# Patient Record
Sex: Male | Born: 1945 | Race: White | Hispanic: No | Marital: Married | Smoking: Former smoker
Health system: Southern US, Community
[De-identification: ages and names within clinical notes are randomized; demographics above are authoritative.]

## PROBLEM LIST (undated history)

## (undated) DIAGNOSIS — K5730 Diverticulosis of large intestine without perforation or abscess without bleeding: Secondary | ICD-10-CM

## (undated) DIAGNOSIS — R030 Elevated blood-pressure reading, without diagnosis of hypertension: Secondary | ICD-10-CM

## (undated) DIAGNOSIS — K635 Polyp of colon: Secondary | ICD-10-CM

## (undated) DIAGNOSIS — Z860100 Personal history of colon polyps, unspecified: Secondary | ICD-10-CM

## (undated) DIAGNOSIS — M1712 Unilateral primary osteoarthritis, left knee: Secondary | ICD-10-CM

## (undated) DIAGNOSIS — E785 Hyperlipidemia, unspecified: Secondary | ICD-10-CM

## (undated) DIAGNOSIS — I8390 Asymptomatic varicose veins of unspecified lower extremity: Secondary | ICD-10-CM

## (undated) DIAGNOSIS — K649 Unspecified hemorrhoids: Secondary | ICD-10-CM

## (undated) DIAGNOSIS — R7303 Prediabetes: Secondary | ICD-10-CM

## (undated) DIAGNOSIS — N281 Cyst of kidney, acquired: Secondary | ICD-10-CM

## (undated) DIAGNOSIS — C4491 Basal cell carcinoma of skin, unspecified: Secondary | ICD-10-CM

## (undated) DIAGNOSIS — Z87442 Personal history of urinary calculi: Secondary | ICD-10-CM

## (undated) DIAGNOSIS — B029 Zoster without complications: Secondary | ICD-10-CM

## (undated) DIAGNOSIS — Z972 Presence of dental prosthetic device (complete) (partial): Secondary | ICD-10-CM

## (undated) DIAGNOSIS — K760 Fatty (change of) liver, not elsewhere classified: Secondary | ICD-10-CM

## (undated) HISTORY — DX: Hyperlipidemia, unspecified: E78.5

## (undated) HISTORY — DX: Zoster without complications: B02.9

## (undated) HISTORY — PX: HERNIA REPAIR: SHX51

---
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
## 2006-02-21 ENCOUNTER — Emergency Department: Payer: Self-pay | Admitting: Emergency Medicine

## 2008-02-18 ENCOUNTER — Emergency Department: Payer: Self-pay | Admitting: Emergency Medicine

## 2008-02-20 ENCOUNTER — Emergency Department: Payer: Self-pay | Admitting: Emergency Medicine

## 2008-03-12 ENCOUNTER — Ambulatory Visit: Payer: Self-pay | Admitting: General Surgery

## 2008-03-18 ENCOUNTER — Ambulatory Visit: Payer: Self-pay | Admitting: General Surgery

## 2010-12-11 ENCOUNTER — Ambulatory Visit: Payer: Self-pay | Admitting: Family Medicine

## 2013-04-29 ENCOUNTER — Ambulatory Visit: Payer: Self-pay | Admitting: Family Medicine

## 2014-10-07 DIAGNOSIS — Z23 Encounter for immunization: Secondary | ICD-10-CM | POA: Diagnosis not present

## 2014-10-07 DIAGNOSIS — Z0000 Encounter for general adult medical examination without abnormal findings: Secondary | ICD-10-CM | POA: Diagnosis not present

## 2014-10-08 DIAGNOSIS — Z0000 Encounter for general adult medical examination without abnormal findings: Secondary | ICD-10-CM | POA: Diagnosis not present

## 2014-10-08 DIAGNOSIS — Z131 Encounter for screening for diabetes mellitus: Secondary | ICD-10-CM | POA: Diagnosis not present

## 2014-10-08 DIAGNOSIS — E785 Hyperlipidemia, unspecified: Secondary | ICD-10-CM | POA: Diagnosis not present

## 2014-10-08 LAB — LIPID PANEL
CHOLESTEROL: 229 mg/dL — AB (ref 0–200)
HDL: 59 mg/dL (ref 35–70)
LDL Cholesterol: 154 mg/dL
Triglycerides: 81 mg/dL (ref 40–160)

## 2014-10-08 LAB — PSA: PSA: 0.5

## 2014-10-08 LAB — BASIC METABOLIC PANEL
BUN: 18 mg/dL (ref 4–21)
CREATININE: 1.2 mg/dL (ref 0.6–1.3)
GLUCOSE: 117 mg/dL
POTASSIUM: 4.8 mmol/L (ref 3.4–5.3)
Sodium: 144 mmol/L (ref 137–147)

## 2015-06-22 DIAGNOSIS — S8392XA Sprain of unspecified site of left knee, initial encounter: Secondary | ICD-10-CM | POA: Diagnosis not present

## 2015-07-07 ENCOUNTER — Ambulatory Visit (INDEPENDENT_AMBULATORY_CARE_PROVIDER_SITE_OTHER): Payer: Medicare Other | Admitting: Family Medicine

## 2015-07-07 ENCOUNTER — Ambulatory Visit
Admission: RE | Admit: 2015-07-07 | Discharge: 2015-07-07 | Disposition: A | Discharge status: Home / Self Care | Payer: Medicare Other | Source: Ambulatory Visit | Attending: Family Medicine | Admitting: Family Medicine

## 2015-07-07 ENCOUNTER — Encounter: Payer: Self-pay | Admitting: Family Medicine

## 2015-07-07 VITALS — BP 138/78 | HR 64 | Temp 97.5°F | Resp 14 | Wt 245.6 lb

## 2015-07-07 DIAGNOSIS — M25562 Pain in left knee: Secondary | ICD-10-CM | POA: Diagnosis not present

## 2015-07-07 DIAGNOSIS — N200 Calculus of kidney: Secondary | ICD-10-CM | POA: Insufficient documentation

## 2015-07-07 DIAGNOSIS — K429 Umbilical hernia without obstruction or gangrene: Secondary | ICD-10-CM | POA: Insufficient documentation

## 2015-07-07 DIAGNOSIS — B029 Zoster without complications: Secondary | ICD-10-CM

## 2015-07-07 DIAGNOSIS — M722 Plantar fascial fibromatosis: Secondary | ICD-10-CM

## 2015-07-07 DIAGNOSIS — D126 Benign neoplasm of colon, unspecified: Secondary | ICD-10-CM

## 2015-07-07 DIAGNOSIS — K635 Polyp of colon: Secondary | ICD-10-CM | POA: Insufficient documentation

## 2015-07-07 DIAGNOSIS — M7071 Other bursitis of hip, right hip: Secondary | ICD-10-CM

## 2015-07-07 DIAGNOSIS — E785 Hyperlipidemia, unspecified: Secondary | ICD-10-CM

## 2015-07-07 DIAGNOSIS — D125 Benign neoplasm of sigmoid colon: Secondary | ICD-10-CM

## 2015-07-07 DIAGNOSIS — R590 Localized enlarged lymph nodes: Secondary | ICD-10-CM | POA: Insufficient documentation

## 2015-07-07 DIAGNOSIS — H9193 Unspecified hearing loss, bilateral: Secondary | ICD-10-CM

## 2015-07-07 HISTORY — DX: Zoster without complications: B02.9

## 2015-07-07 HISTORY — DX: Hyperlipidemia, unspecified: E78.5

## 2015-07-07 NOTE — Progress Notes (Signed)
Subjective:     Patient ID: Jonathan Greer, male   DOB: 1946/04/01, 70 y.o.   MRN: [id number]  HPI  Chief Complaint  Patient presents with  . Knee Pain    Patient fell X 2 weeks ago, pain makes it difficult to sleep. Patient describes pain as a ache.  States he fell off the bottom rungs of a ladder and felt pain in the back of his left knee. Continues to hurt with W.B and NWB but improves after he has been up on it for a while. He has continued to work as a Contractor.   Review of Systems     Objective:   Physical Exam  Constitutional: He appears well-developed and well-nourished. Distressed: moderate discomfort when arisng from a chair.  Musculoskeletal:  Tender over the lateral aspect of his left knee. Knee ligaments stable. No effusion/erythema. KF/KE 5/5 with increased posterior knee pain with knee flexion. FROM.       Assessment:    1. Posterior knee pain, left: suspect tendonitis but will check for avulsion fracture. - DG Knee Complete 4 Views Left; Future    Plan:    Discussed scheduling two Aleve twice daily with food.

## 2015-07-07 NOTE — Patient Instructions (Signed)
Discussed scheduling two Aleve twice daily with food.

## 2015-07-08 ENCOUNTER — Telehealth: Payer: Self-pay

## 2015-07-08 NOTE — Telephone Encounter (Signed)
Patient advised as directed below. Patient verbalized understanding.  

## 2015-07-08 NOTE — Telephone Encounter (Signed)
-----   Message from Carmon Ginsberg, [place] sent at 07/07/2015  4:31 PM EST ----- No fractures. Try the aleve for a week. Ice your knee for 20 minutes at the end of your workday.

## 2015-07-26 DIAGNOSIS — S86912A Strain of unspecified muscle(s) and tendon(s) at lower leg level, left leg, initial encounter: Secondary | ICD-10-CM | POA: Diagnosis not present

## 2015-07-26 DIAGNOSIS — M25562 Pain in left knee: Secondary | ICD-10-CM | POA: Diagnosis not present

## 2015-08-25 ENCOUNTER — Encounter: Payer: Self-pay | Admitting: Family Medicine

## 2015-08-25 ENCOUNTER — Ambulatory Visit (INDEPENDENT_AMBULATORY_CARE_PROVIDER_SITE_OTHER): Payer: Medicare Other | Admitting: Family Medicine

## 2015-08-25 VITALS — BP 122/76 | HR 70 | Temp 97.8°F | Resp 16 | Wt 243.4 lb

## 2015-08-25 DIAGNOSIS — J3489 Other specified disorders of nose and nasal sinuses: Secondary | ICD-10-CM

## 2015-08-25 DIAGNOSIS — M222X2 Patellofemoral disorders, left knee: Secondary | ICD-10-CM

## 2015-08-25 MED ORDER — MELOXICAM 15 MG PO TABS: 15.0000 mg | ORAL_TABLET | Freq: Every day | ORAL | Status: DC

## 2015-08-25 MED ORDER — HYDROCODONE-ACETAMINOPHEN 5-325 MG PO TABS: ORAL_TABLET | ORAL | Status: DC

## 2015-08-25 NOTE — Patient Instructions (Signed)
Ice down your knee at the end of your day for 20 minutes.

## 2015-08-25 NOTE — Progress Notes (Signed)
Subjective:     Patient ID: Jonathan Greer, male   DOB: [birth year], 70 y.o.   MRN: [id number]  HPI  Chief Complaint  Patient presents with  . Skin Problem    Patient comes in office today for skin check, patient reports that he has a "bump" that has been present for the past 12 months located on the crease of his inner eye and nose onb his left side. Patient reports that area is very tender and painful to the touch, skin appears to be cracked and bleeding. Patient reports that area awas very swollen but has gone down in the past few days  . Knee Pain    Patient would like to address knee pain that he has been having for the past month, patient denies any injury or accident. Patient was seen at [facility], x-ray of knee according to patient was normal he was prescribed pain medication and Meloxicam 7.5mg . Patient reports that he finished pain medication and continues on Meloxicam but has not noticed any improvement  Originally saw me 1/19 for posterior knee pain with normal x-rays. Subsequently developed knee cap area pain and went to [facility] 2/7. X-rays once again were normal. Rx'ed meloxicam and has been using Osteo-Biflex otc. States he goes up and down ladders throughout his work day. Accompanied by his grandson today.    Review of Systems     Objective:   Physical Exam  Constitutional: He appears well-developed and well-nourished. No distress.  Musculoskeletal:  Mild tenderness over his left patella with "tightness" on ranging. Mild posterior knee tenderness with mild pain on resisting knee flexion.  Skin:  1.5 cm reddish brown papule on the bridge of his nose adjacent to his left medial canthus.       Assessment:    1. Patellofemoral syndrome, left - meloxicam (MOBIC) 15 MG tablet; Take 1 tablet (15 mg total) by mouth daily.  Dispense: 30 tablet; Refill: 0 - HYDROcodone-acetaminophen (NORCO/VICODIN) 5-325 MG tablet; One every 4-6 hours as needed for pain  Dispense:  28 tablet; Refill: 0 - Ambulatory referral to Orthopedic Surgery  2. Lesion of nose - Ambulatory referral to Dermatology    Plan:    return pending referral evaluations. Ice knee for 20 minutes after his work day.

## 2015-08-29 DIAGNOSIS — C44319 Basal cell carcinoma of skin of other parts of face: Secondary | ICD-10-CM | POA: Diagnosis not present

## 2015-08-29 DIAGNOSIS — D485 Neoplasm of uncertain behavior of skin: Secondary | ICD-10-CM | POA: Diagnosis not present

## 2015-08-29 DIAGNOSIS — L578 Other skin changes due to chronic exposure to nonionizing radiation: Secondary | ICD-10-CM | POA: Diagnosis not present

## 2015-09-15 DIAGNOSIS — M2242 Chondromalacia patellae, left knee: Secondary | ICD-10-CM | POA: Diagnosis not present

## 2015-09-15 DIAGNOSIS — M2240 Chondromalacia patellae, unspecified knee: Secondary | ICD-10-CM | POA: Insufficient documentation

## 2015-10-06 DIAGNOSIS — C44311 Basal cell carcinoma of skin of nose: Secondary | ICD-10-CM | POA: Diagnosis not present

## 2015-10-06 DIAGNOSIS — Z85828 Personal history of other malignant neoplasm of skin: Secondary | ICD-10-CM | POA: Diagnosis not present

## 2015-10-17 DIAGNOSIS — D485 Neoplasm of uncertain behavior of skin: Secondary | ICD-10-CM | POA: Diagnosis not present

## 2015-10-17 DIAGNOSIS — C44319 Basal cell carcinoma of skin of other parts of face: Secondary | ICD-10-CM | POA: Diagnosis not present

## 2016-01-05 DIAGNOSIS — C44319 Basal cell carcinoma of skin of other parts of face: Secondary | ICD-10-CM | POA: Diagnosis not present

## 2016-01-05 DIAGNOSIS — C44311 Basal cell carcinoma of skin of nose: Secondary | ICD-10-CM | POA: Diagnosis not present

## 2016-01-10 HISTORY — PX: MOHS SURGERY: SUR867

## 2016-01-17 ENCOUNTER — Encounter: Payer: Self-pay | Admitting: Family Medicine

## 2016-01-17 DIAGNOSIS — Z85828 Personal history of other malignant neoplasm of skin: Secondary | ICD-10-CM | POA: Insufficient documentation

## 2016-01-18 ENCOUNTER — Encounter: Payer: Self-pay | Admitting: Family Medicine

## 2016-01-28 DIAGNOSIS — H524 Presbyopia: Secondary | ICD-10-CM | POA: Diagnosis not present

## 2016-04-27 ENCOUNTER — Telehealth: Payer: Self-pay | Admitting: Family Medicine

## 2016-04-27 NOTE — Telephone Encounter (Signed)
Called Pt to schedule AWV with [facility] - knb °

## 2016-05-08 DIAGNOSIS — M25562 Pain in left knee: Secondary | ICD-10-CM | POA: Diagnosis not present

## 2016-05-08 DIAGNOSIS — G8929 Other chronic pain: Secondary | ICD-10-CM | POA: Diagnosis not present

## 2016-05-08 DIAGNOSIS — M5416 Radiculopathy, lumbar region: Secondary | ICD-10-CM | POA: Diagnosis not present

## 2016-05-21 DIAGNOSIS — C44319 Basal cell carcinoma of skin of other parts of face: Secondary | ICD-10-CM | POA: Diagnosis not present

## 2016-05-21 DIAGNOSIS — C44311 Basal cell carcinoma of skin of nose: Secondary | ICD-10-CM | POA: Diagnosis not present

## 2016-05-31 ENCOUNTER — Telehealth: Payer: Self-pay | Admitting: Family Medicine

## 2016-06-27 ENCOUNTER — Ambulatory Visit (INDEPENDENT_AMBULATORY_CARE_PROVIDER_SITE_OTHER): Payer: Medicare Other | Admitting: Family Medicine

## 2016-06-27 ENCOUNTER — Encounter: Payer: Self-pay | Admitting: Family Medicine

## 2016-06-27 VITALS — BP 132/80 | HR 58 | Temp 97.5°F | Resp 18 | Ht 69.75 in | Wt 245.0 lb

## 2016-06-27 DIAGNOSIS — D126 Benign neoplasm of colon, unspecified: Secondary | ICD-10-CM | POA: Diagnosis not present

## 2016-06-27 DIAGNOSIS — Z1159 Encounter for screening for other viral diseases: Secondary | ICD-10-CM | POA: Diagnosis not present

## 2016-06-27 DIAGNOSIS — Z0000 Encounter for general adult medical examination without abnormal findings: Secondary | ICD-10-CM | POA: Diagnosis not present

## 2016-06-27 DIAGNOSIS — Z85828 Personal history of other malignant neoplasm of skin: Secondary | ICD-10-CM

## 2016-06-27 DIAGNOSIS — Z23 Encounter for immunization: Secondary | ICD-10-CM | POA: Diagnosis not present

## 2016-06-27 DIAGNOSIS — E785 Hyperlipidemia, unspecified: Secondary | ICD-10-CM | POA: Diagnosis not present

## 2016-06-27 NOTE — Progress Notes (Signed)
Patient: Jonathan Greer, Male    DOB: 08-05-1945, 71 y.o.   MRN: [id number] Visit Date: 06/27/2016  Today's Provider: Lelon Huh, MD   Chief Complaint  Patient presents with  . Annual Exam  . Hyperlipidemia    follow up   Subjective:     Annual physical Jonathan Greer is a 71 y.o. male. He feels fairly well. He reports exercising rarely. He reports he is sleeping fairly well.  -----------------------------------------------------------  Lipid/Cholesterol, Follow-up:   Last seen for this 2 years ago.  Management changes since that visit include none; patient was advised to start regular exercise and low fat food choices. . Last Lipid Panel:    Component Value Date/Time   CHOL 229 (A) 10/08/2014   TRIG 81 10/08/2014   HDL 59 10/08/2014   [place] 154 10/08/2014    Risk factors for vascular disease include hypercholesterolemia  He reports fair compliance with treatment. He is not having side effects.  Current symptoms include none and have been stable. Weight trend: fluctuating a bit Prior visit with dietician: no Current diet: well balanced Current exercise: none  Wt Readings from Last 3 Encounters:  08/25/15 243 lb 6.4 oz (110.4 kg)  07/07/15 245 lb 9.6 oz (111.4 kg)    -------------------------------------------------------------------   Review of Systems  Constitutional: Negative for appetite change, chills, fatigue and fever.  HENT: Negative for congestion, ear pain, hearing loss, nosebleeds and trouble swallowing.   Eyes: Negative for pain and visual disturbance.  Respiratory: Negative for cough, chest tightness and shortness of breath.   Cardiovascular: Negative for chest pain, palpitations and leg swelling.  Gastrointestinal: Negative for abdominal pain, blood in stool, constipation, diarrhea, nausea and vomiting.  Endocrine: Negative for polydipsia, polyphagia and polyuria.  Genitourinary: Negative for dysuria and flank pain.    Musculoskeletal: Negative for arthralgias, back pain, joint swelling, myalgias and neck stiffness.  Skin: Negative for color change, rash and wound.  Neurological: Negative for dizziness, tremors, seizures, speech difficulty, weakness, light-headedness and headaches.  Psychiatric/Behavioral: Negative for behavioral problems, confusion, decreased concentration, dysphoric mood and sleep disturbance. The patient is not nervous/anxious.   All other systems reviewed and are negative.   Social History   Social History  . Marital status: Married    Spouse name: N/A  . Number of children: 4  . Years of education: N/A   Occupational History  . 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 Smoking status: Former Smoker    Packs/day: 2.00    Years: 20.00    Types: Cigarettes  . Smokeless tobacco: Never Used  . Alcohol use No  . Drug use: No  . Sexual activity: Not on file   Other Topics Concern  . Not on file   Social History Narrative  . No narrative on file    Past Medical History:  Diagnosis Date  . Shingles 07/07/2015     Patient Active Problem List   Diagnosis Date Noted  . History of basal cell carcinoma of skin 01/17/2016  . Hyperlipemia 07/07/2015  . 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 Kidney stone 07/07/2015  . 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 HERNIA REPAIR    . MOHS SURGERY  01/10/2016   [facility] for [facility]  . TONSILLECTOMY AND ADENOIDECTOMY  1960's    His  family history includes CVA in his father; Cancer in his paternal grandfather and paternal grandmother; Heart failure in his mother; Liver cancer in his maternal grandmother; Lung cancer in his brother.     No current outpatient prescriptions on file.  Patient Care Team: Birdie Sons, MD as PCP - General (Family Medicine)     Objective:   Vitals: BP 132/80 (BP Location:  Left Arm, Patient Position: Sitting, Cuff Size: Large)   Pulse (!) 58   Temp 97.5 F (36.4 C) (Oral)   Resp 18   Ht 5' 9.75" (1.772 m)   Wt 245 lb (111.1 kg)   SpO2 98% Comment: room air  BMI 35.41 kg/m   Physical Exam   General Appearance:    Alert, cooperative, no distress, appears stated age  Head:    Normocephalic, without obvious abnormality, atraumatic  Eyes:    PERRL, conjunctiva/corneas clear, EOM's intact, fundi    benign, both eyes       Ears:    Normal TM's and external ear canals, both ears  Nose:   Nares normal, septum midline, mucosa normal, no drainage   or sinus tenderness  Throat:   Lips, mucosa, and tongue normal; teeth and gums normal  Neck:   Supple, symmetrical, trachea midline, no adenopathy;       thyroid:  No enlargement/tenderness/nodules; no carotid   bruit or JVD  Back:     Symmetric, no curvature, ROM normal, no CVA tenderness  Lungs:     Clear to auscultation bilaterally, respirations unlabored  Chest wall:    No tenderness or deformity  Heart:    Regular rate and rhythm, S1 and S2 normal, no murmur, rub   or gallop  Abdomen:     Soft, non-tender, bowel sounds active all four quadrants,    no masses, no organomegaly  Genitalia:    deferred  Rectal:    deferred  Extremities:   Extremities normal, atraumatic, no cyanosis or edema  Pulses:   2+ and symmetric all extremities  Skin:   Skin color, texture, turgor normal, no rashes or lesions  Lymph nodes:   Cervical, supraclavicular, and axillary nodes normal  Neurologic:   CNII-XII intact. Normal strength, sensation and reflexes      throughout    Activities of Daily Living In your present state of health, do you have any difficulty performing the following activities: 06/27/2016  Hearing? Y  Vision? Y  Difficulty concentrating or making decisions? Y  Walking or climbing stairs? N  Dressing or bathing? N  Doing errands, shopping? N  Some recent data might be hidden    Fall Risk  Assessment Fall Risk  06/27/2016  Falls in the past year? No     Depression Screen PHQ 2/9 Scores 06/27/2016  PHQ - 2 Score 0    Cognitive Testing - 6-CIT  Correct? Score   What year is it? yes 0 0 or 4  What month is it? yes 0 0 or 3  Memorize:    [place],  42,  [place],      What time is it? (within 1 hour) yes 0 0 or 3  Count backwards from 20 yes 0 0, 2, or 4  Name the months of the year yes 0 0, 2, or 4  Repeat name & address above no 6 0, 2, 4, 6, 8, or 10       TOTAL SCORE  6/28   Interpretation:  Normal  Normal (  0-7) Abnormal (8-28)   Audit-C Alcohol Use Screening  Question Answer Points  How often do you have alcoholic drink? never 0  On days you do drink alcohol, how many drinks do you typically consume? n/a 0  How oftey will you drink 6 or more in a total? never 0  Total Score:  0   A score of 3 or more in women, and 4 or more in men indicates increased risk for alcohol abuse, EXCEPT if all of the points are from question 1.  Current Exercise Habits: The patient does not participate in regular exercise at present Exercise limited by: None identified    Assessment & Plan:     Annual Wellness Visit  Reviewed patient's Family Medical History Reviewed and updated list of patient's medical providers Assessment of cognitive impairment was done Assessed patient's functional ability Established a written schedule for health screening [place] Completed and Reviewed  Exercise Activities and Dietary recommendations Goals    None      Immunization History  Administered Date(s) Administered  . Pneumococcal Conjugate-13 10/07/2014  . Pneumococcal Polysaccharide-23 03/18/2013  . Tdap 06/30/2010    Health Maintenance  Topic Date Due  . Hepatitis C Screening  12-Oct-1945  . COLONOSCOPY  02/10/1996  . ZOSTAVAX  02/09/2006  . PNA vac Low Risk Adult (2 of 2 - PCV13) 03/18/2014  . INFLUENZA VACCINE  01/17/2016  .  TETANUS/TDAP  06/30/2020     Discussed health benefits of physical activity, and encouraged him to engage in regular exercise appropriate for his age and condition.    ------------------------------------------------------------------------------------------------------------  1. Annual physical exam Generally doing well.   2. History of basal cell carcinoma of skin Followed at [facility] dermatology.   3. Hyperlipidemia, unspecified hyperlipidemia type Not currently on statin.  - Lipid panel - Comprehensive metabolic panel - TSH  4. Need for hepatitis C screening test  - Hepatitis C antibody  5. Need for influenza vaccination  - Flu vaccine HIGH DOSE PF  6. Tubulovillous adenoma of colon Is overdue for follow up colonoscopy. Last was by Dr. Dionne Milo January 2013 - Ambulatory referral to Gastroenterology  The entirety of the information documented in the History of Present Illness, Review of Systems and Physical Exam were personally obtained by me. Portions of this information were initially documented by Meyer Cory, CMA and reviewed by me for thoroughness and accuracy.    Lelon Huh, MD  [facility]

## 2016-06-28 DIAGNOSIS — Z1159 Encounter for screening for other viral diseases: Secondary | ICD-10-CM | POA: Diagnosis not present

## 2016-06-28 DIAGNOSIS — E785 Hyperlipidemia, unspecified: Secondary | ICD-10-CM | POA: Diagnosis not present

## 2016-06-29 ENCOUNTER — Telehealth: Payer: Self-pay

## 2016-06-29 LAB — COMPREHENSIVE METABOLIC PANEL
A/G RATIO: 1.5 (ref 1.2–2.2)
ALT: 38 IU/L (ref 0–44)
AST: 32 IU/L (ref 0–40)
Albumin: 4.5 g/dL (ref 3.5–4.8)
Alkaline Phosphatase: 51 IU/L (ref 39–117)
BUN/Creatinine Ratio: 14 (ref 10–24)
BUN: 15 mg/dL (ref 8–27)
Bilirubin Total: 0.8 mg/dL (ref 0.0–1.2)
CALCIUM: 9.9 mg/dL (ref 8.6–10.2)
CO2: 22 mmol/L (ref 18–29)
CREATININE: 1.06 mg/dL (ref 0.76–1.27)
Chloride: 102 mmol/L (ref 96–106)
GFR, EST AFRICAN AMERICAN: 82 mL/min/{1.73_m2} (ref >59)
GFR, EST NON AFRICAN AMERICAN: 71 mL/min/{1.73_m2} (ref >59)
Globulin, Total: 3 g/dL (ref 1.5–4.5)
Glucose: 104 mg/dL — ABNORMAL HIGH (ref 65–99)
POTASSIUM: 4.5 mmol/L (ref 3.5–5.2)
Sodium: 142 mmol/L (ref 134–144)
Total Protein: 7.5 g/dL (ref 6.0–8.5)

## 2016-06-29 LAB — HEPATITIS C ANTIBODY: Hep C Virus Ab: <0.1 s/co ratio (ref 0.0–0.9)

## 2016-06-29 LAB — LIPID PANEL
CHOL/HDL RATIO: 3.3 ratio (ref 0.0–5.0)
Cholesterol, Total: 224 mg/dL — ABNORMAL HIGH (ref 100–199)
HDL: 68 mg/dL (ref >39)
LDL CALC: 143 mg/dL — AB (ref 0–99)
TRIGLYCERIDES: 66 mg/dL (ref 0–149)
VLDL Cholesterol Cal: 13 mg/dL (ref 5–40)

## 2016-06-29 LAB — TSH: TSH: 1.97 u[IU]/mL (ref 0.450–4.500)

## 2016-06-29 NOTE — Telephone Encounter (Signed)
-----   Message from Birdie Sons, MD sent at 06/29/2016 12:15 PM EST ----- Labs are good, cholesterol a little high at 224. Not high enough to require medications, but do recommend taking 81mg  coated aspirin every day. Check labs yearly .

## 2016-06-29 NOTE — Telephone Encounter (Signed)
Pt advised.   Thanks,   -Majesti Gambrell  

## 2016-07-11 ENCOUNTER — Telehealth: Payer: Self-pay

## 2016-07-11 ENCOUNTER — Other Ambulatory Visit: Payer: Self-pay

## 2016-07-11 NOTE — Telephone Encounter (Signed)
Gastroenterology Pre-Procedure Review  Request Date:  Requesting Physician: Dr.   PATIENT REVIEW QUESTIONS: The patient responded to the following health history questions as indicated:    1. Are you having any GI issues? yes (constipation) 2. Do you have a personal history of Polyps? yes (polyps) 3. Do you have a family history of Colon Cancer or Polyps? no 4. Diabetes Mellitus? no 5. Joint replacements in the past 12 months?no 6. Major health problems in the past 3 months?no 7. Any artificial heart valves, MVP, or defibrillator?no    MEDICATIONS & ALLERGIES:    Patient reports the following regarding taking any anticoagulation/antiplatelet therapy:   Plavix, Coumadin, Eliquis, Xarelto, Lovenox, Pradaxa, Brilinta, or Effient? no Aspirin? no  Patient confirms/reports the following medications:  No current outpatient prescriptions on file.   No current facility-administered medications for this visit.     Patient confirms/reports the following allergies:  No Known Allergies  No orders of the defined types were placed in this encounter.   AUTHORIZATION INFORMATION Primary Insurance: 1D#: Group #:  Secondary Insurance: 1D#: Group #:  SCHEDULE INFORMATION: Date: 07/27/16 Time: Location: [facility]

## 2016-07-27 ENCOUNTER — Ambulatory Visit: Payer: Medicare Other | Admitting: Anesthesiology

## 2016-07-27 ENCOUNTER — Encounter
Admission: RE | Disposition: A | Discharge status: Home / Self Care | Payer: Self-pay | Source: Ambulatory Visit | Attending: Gastroenterology

## 2016-07-27 ENCOUNTER — Ambulatory Visit
Admission: RE | Admit: 2016-07-27 | Discharge: 2016-07-27 | Disposition: A | Discharge status: Home / Self Care | Payer: Medicare Other | Source: Ambulatory Visit | Attending: Gastroenterology | Admitting: Gastroenterology

## 2016-07-27 DIAGNOSIS — K635 Polyp of colon: Secondary | ICD-10-CM | POA: Diagnosis not present

## 2016-07-27 DIAGNOSIS — K640 First degree hemorrhoids: Secondary | ICD-10-CM | POA: Diagnosis not present

## 2016-07-27 DIAGNOSIS — Z1211 Encounter for screening for malignant neoplasm of colon: Secondary | ICD-10-CM | POA: Diagnosis not present

## 2016-07-27 DIAGNOSIS — D123 Benign neoplasm of transverse colon: Secondary | ICD-10-CM | POA: Diagnosis not present

## 2016-07-27 DIAGNOSIS — K5790 Diverticulosis of intestine, part unspecified, without perforation or abscess without bleeding: Secondary | ICD-10-CM | POA: Diagnosis not present

## 2016-07-27 DIAGNOSIS — D125 Benign neoplasm of sigmoid colon: Secondary | ICD-10-CM

## 2016-07-27 DIAGNOSIS — Z87442 Personal history of urinary calculi: Secondary | ICD-10-CM | POA: Insufficient documentation

## 2016-07-27 DIAGNOSIS — Z860100 Personal history of colon polyps, unspecified: Secondary | ICD-10-CM

## 2016-07-27 DIAGNOSIS — Z87891 Personal history of nicotine dependence: Secondary | ICD-10-CM | POA: Insufficient documentation

## 2016-07-27 DIAGNOSIS — D122 Benign neoplasm of ascending colon: Secondary | ICD-10-CM | POA: Diagnosis not present

## 2016-07-27 DIAGNOSIS — K5730 Diverticulosis of large intestine without perforation or abscess without bleeding: Secondary | ICD-10-CM | POA: Diagnosis not present

## 2016-07-27 DIAGNOSIS — K649 Unspecified hemorrhoids: Secondary | ICD-10-CM | POA: Diagnosis not present

## 2016-07-27 DIAGNOSIS — D120 Benign neoplasm of cecum: Secondary | ICD-10-CM | POA: Diagnosis not present

## 2016-07-27 HISTORY — PX: COLONOSCOPY WITH PROPOFOL: SHX5780

## 2016-07-27 SURGERY — COLONOSCOPY WITH PROPOFOL
Anesthesia: General

## 2016-07-27 MED ORDER — FENTANYL CITRATE (PF) 100 MCG/2ML IJ SOLN
INTRAMUSCULAR | Status: DC | PRN
  Administered 2016-07-27: 50 ug via INTRAVENOUS

## 2016-07-27 MED ORDER — FENTANYL CITRATE (PF) 100 MCG/2ML IJ SOLN
INTRAMUSCULAR | Status: AC
  Filled 2016-07-27: qty 2

## 2016-07-27 MED ORDER — SODIUM CHLORIDE 0.9 % IV SOLN
INTRAVENOUS | Status: DC
  Administered 2016-07-27: 09:00:00 via INTRAVENOUS

## 2016-07-27 MED ORDER — MIDAZOLAM HCL 5 MG/5ML IJ SOLN
INTRAMUSCULAR | Status: DC | PRN
  Administered 2016-07-27: 1 mg via INTRAVENOUS

## 2016-07-27 MED ORDER — PROPOFOL 500 MG/50ML IV EMUL
INTRAVENOUS | Status: AC
  Filled 2016-07-27: qty 50

## 2016-07-27 MED ORDER — LACTATED RINGERS IV SOLN
INTRAVENOUS | Status: DC | PRN
  Administered 2016-07-27: 09:00:00 via INTRAVENOUS

## 2016-07-27 MED ORDER — PROPOFOL 10 MG/ML IV BOLUS
INTRAVENOUS | Status: DC | PRN
  Administered 2016-07-27: 54.5 mg via INTRAVENOUS

## 2016-07-27 MED ORDER — PROPOFOL 500 MG/50ML IV EMUL
INTRAVENOUS | Status: DC | PRN
  Administered 2016-07-27: 120 ug/kg/min via INTRAVENOUS

## 2016-07-27 MED ORDER — LIDOCAINE HCL (PF) 2 % IJ SOLN
INTRAMUSCULAR | Status: AC
  Filled 2016-07-27: qty 2

## 2016-07-27 MED ORDER — METHYLENE BLUE 0.5 % INJ SOLN
INTRAVENOUS | Status: DC | PRN
  Administered 2016-07-27: 1 mL via SUBMUCOSAL
  Administered 2016-07-27: 11 mL via SUBMUCOSAL

## 2016-07-27 MED ORDER — SPOT INK MARKER SYRINGE KIT
PACK | SUBMUCOSAL | Status: DC | PRN
  Administered 2016-07-27: 1 mL via SUBMUCOSAL

## 2016-07-27 MED ORDER — METHYLENE BLUE 0.5 % INJ SOLN
INTRAVENOUS | Status: AC
  Filled 2016-07-27: qty 10

## 2016-07-27 MED ORDER — MIDAZOLAM HCL 2 MG/2ML IJ SOLN
INTRAMUSCULAR | Status: AC
  Filled 2016-07-27: qty 2

## 2016-07-27 NOTE — Anesthesia Postprocedure Evaluation (Signed)
Anesthesia Post Note  Patient: Jonathan Greer  Procedure(s) Performed: Procedure(s) (LRB): COLONOSCOPY WITH PROPOFOL (N/A)  Patient location during evaluation: Endoscopy Anesthesia Type: General Level of consciousness: awake and alert Pain management: pain level controlled Vital Signs Assessment: post-procedure vital signs reviewed and stable Respiratory status: spontaneous breathing and respiratory function stable Cardiovascular status: stable Anesthetic complications: no     Last Vitals:  Vitals:   07/27/16 0838 07/27/16 1008  BP: 135/88   Pulse: 67 63  Resp: 16 15  Temp: 36.3 C (!) 35.6 C    Last Pain:  Vitals:   07/27/16 1008  TempSrc: Tympanic                 Marlyne Totaro K

## 2016-07-27 NOTE — Transfer of Care (Signed)
Immediate Anesthesia Transfer of Care Note  Patient: Jonathan Greer  Procedure(s) Performed: Procedure(s): COLONOSCOPY WITH PROPOFOL (N/A)  Patient Location: PACU and Endoscopy Unit  Anesthesia Type:General  Level of Consciousness: sedated  Airway & Oxygen Therapy: Patient Spontanous Breathing and Patient connected to nasal cannula oxygen  Post-op Assessment: Report given to RN and Post -op Vital signs reviewed and stable  Post vital signs: stable  Last Vitals:  Vitals:   07/27/16 0838 07/27/16 1008  BP: 135/88   Pulse: 67   Resp: 16   Temp: 36.3 C (!) (P) 35.6 C    Last Pain:  Vitals:   07/27/16 1008  TempSrc: (P) Tympanic         Complications: No apparent anesthesia complications

## 2016-07-27 NOTE — H&P (Signed)
  Jonathan Bellows MD [address], [place], [place] [address] Phone: [phone] Fax : [phone]  Primary Care Physician:  Lelon Huh, MD Primary Gastroenterologist:  Dr. Jonathon Greer   Pre-Procedure History & Physical: HPI:  Jonathan Greer is a 71 y.o. male is here for an colonoscopy.   Past Medical History:  Diagnosis Date  . Hyperlipidemia   . Shingles 07/07/2015    Past Surgical History:  Procedure Laterality Date  . HERNIA REPAIR    . MOHS SURGERY  01/10/2016   [facility]  . TONSILLECTOMY AND ADENOIDECTOMY  1960's    Prior to Admission medications   Not on File    Allergies as of 07/11/2016  . (No Known Allergies)    Family History  Problem Relation Age of Onset  . Heart failure Mother   . CVA Father   . Lung cancer Brother   . Liver cancer Maternal Grandmother   . Cancer Paternal Grandmother   . Cancer Paternal Grandfather     Social History   Social History  . Marital status: Married    Spouse name: N/A  . Number of children: 4  . Years of education: N/A   Occupational History  . 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 Smoking status: Former Smoker    Packs/day: 2.00    Years: 20.00    Types: Cigarettes    Quit date: 06/18/1974  . Smokeless tobacco: Never Used     Comment: Quit in the 79s  . Alcohol use No  . Drug use: No  . Sexual activity: Not on file   Other Topics Concern  . Not on file   Social History Narrative   Working 5 days a week in [employer]. (06/2016)    Review of Systems: See HPI, otherwise negative ROS  Physical Exam: BP 135/88   Pulse 67   Temp 97.4 F (36.3 C) (Tympanic)   Resp 16   Ht 5' 9.5" (1.765 m)   Wt 240 lb (108.9 kg)   SpO2 100%   BMI 34.93 kg/m  General:   Alert,  pleasant and cooperative in NAD Head:  Normocephalic and atraumatic. Neck:  Supple; no masses or thyromegaly. Lungs:  Clear throughout to auscultation.      Heart:  Regular rate and rhythm. Abdomen:  Soft, nontender and nondistended. Normal bowel sounds, without guarding, and without rebound.   Neurologic:  Alert and  oriented x4;  grossly normal neurologically.  Impression/Plan: Jonathan Greer is here for an colonoscopy to be performed for surveillance of colon polyps due to prior history   Risks, benefits, limitations, and alternatives regarding  colonoscopy have been reviewed with the patient.  Questions have been answered.  All parties agreeable.   Jonathan Bellows, MD  07/27/2016, 9:05 AM

## 2016-07-27 NOTE — Anesthesia Postprocedure Evaluation (Signed)
Anesthesia Post Note  Patient: Jonathan Greer  Procedure(s) Performed: Procedure(s) (LRB): COLONOSCOPY WITH PROPOFOL (N/A)  Patient location during evaluation: Endoscopy Anesthesia Type: General Level of consciousness: awake and alert Pain management: pain level controlled Vital Signs Assessment: post-procedure vital signs reviewed and stable Respiratory status: spontaneous breathing and respiratory function stable Cardiovascular status: stable Anesthetic complications: no     Last Vitals:  Vitals:   07/27/16 0838 07/27/16 1008  BP: 135/88   Pulse: 67 63  Resp: 16 15  Temp: 36.3 C (!) 35.6 C    Last Pain:  Vitals:   07/27/16 1008  TempSrc: Tympanic                 KEPHART,WILLIAM K

## 2016-07-27 NOTE — Anesthesia Preprocedure Evaluation (Signed)
Anesthesia Evaluation  Patient identified by MRN, date of birth, ID band Patient awake    Reviewed: Allergy & Precautions, NPO status , Patient's Chart, lab work & pertinent test results  History of Anesthesia Complications Negative for: history of anesthetic complications  Airway Mallampati: II       Dental  (+) Edentulous Upper, Edentulous Lower   Pulmonary neg pulmonary ROS, former smoker,           Cardiovascular negative cardio ROS       Neuro/Psych negative neurological ROS     GI/Hepatic negative GI ROS, Neg liver ROS,   Endo/Other  negative endocrine ROS  Renal/GU Renal disease (stones)     Musculoskeletal   Abdominal   Peds  Hematology negative hematology ROS (+)   Anesthesia Other Findings   Reproductive/Obstetrics                            Anesthesia Physical Anesthesia Plan  ASA: I  Anesthesia Plan: General   Post-op Pain Management:    Induction: Intravenous  Airway Management Planned: Nasal Cannula  Additional Equipment:   Intra-op Plan:   Post-operative Plan:   Informed Consent: I have reviewed the patients History and Physical, chart, labs and discussed the procedure including the risks, benefits and alternatives for the proposed anesthesia with the patient or authorized representative who has indicated his/her understanding and acceptance.     Plan Discussed with:   Anesthesia Plan Comments:         Anesthesia Quick Evaluation

## 2016-07-27 NOTE — Anesthesia Post-op Follow-up Note (Signed)
Anesthesia QCDR form completed.        

## 2016-07-27 NOTE — Op Note (Signed)
[facility] Patient Name: Jonathan Greer Procedure Date: 07/27/2016 9:08 AM MRN: [id number] Account #: [id number] Date of Birth: 05/28/1946 Admit Type: Outpatient Age: 71 Room: [facility] ENDO ROOM 1 Gender: Male Note Status: Finalized Procedure:            Colonoscopy Indications:          High risk colon cancer surveillance: Personal history                        of colonic polyps, Last colonoscopy: April 2003 Providers:            [name] MD, MD Referring MD:         Kirstie Peri. Caryn Section, MD (Referring MD) Medicines:            Monitored Anesthesia Care Complications:        No immediate complications. Procedure:            Pre-Anesthesia Assessment:                       - Prior to the procedure, a History and Physical was                        performed, and patient medications, allergies and                        sensitivities were reviewed. The patient's tolerance of                        previous anesthesia was reviewed.                       - The risks and benefits of the procedure and the                        sedation options and risks were discussed with the                        patient. All questions were answered and informed                        consent was obtained.                       - The risks and benefits of the procedure and the                        sedation options and risks were discussed with the                        patient. All questions were answered and informed                        consent was obtained.                       - ASA Grade Assessment: I - A normal, healthy patient.                       After obtaining informed consent, the colonoscope was  passed under direct vision. Throughout the procedure,                        the patient's blood pressure, pulse, and oxygen                        saturations were monitored continuously. The                        Colonoscope was  introduced through the anus and                        advanced to the the cecum, identified by the                        appendiceal orifice, IC valve and transillumination.                        The colonoscopy was somewhat difficult. The patient                        tolerated the procedure well. The quality of the bowel                        preparation was good. Findings:      The perianal and digital rectal examinations were normal.      A 5 mm polyp was found in the ascending colon. The polyp was sessile.       The polyp was removed with a cold biopsy forceps. Resection and       retrieval were complete.      A 7 mm polyp was found in the transverse colon. The polyp was sessile.       The polyp was removed with a hot snare. Resection and retrieval were       complete.      A 6 mm polyp was found in the transverse colon. The polyp was sessile.       The polyp was removed with a cold snare. Resection and retrieval were       complete.      A 15 mm polyp was found in the transverse colon. The polyp was flat. The       polyp was removed with a saline injection-lift technique using a hot       snare and The polyp was removed with a piecemeal technique using a hot       snare. Resection and retrieval were complete. One hemostatic clip was       successfully placed. The area was tatooed proximally and distally with 1       cc of spot dye      A 10 mm polyp was found in the sigmoid colon. The polyp was       semi-pedunculated. The polyp was removed with a hot snare. Resection and       retrieval were complete. To prevent bleeding post-intervention, one       hemostatic clip was successfully placed. There was no bleeding during,       or at the end, of the procedure.      A few medium-mouthed diverticula were found in the sigmoid colon.      Non-bleeding internal hemorrhoids were found during retroflexion. The  hemorrhoids were small and Grade I (internal hemorrhoids that do not        prolapse).      The exam was otherwise without abnormality on direct and retroflexion       views. Impression:           - One 5 mm polyp in the ascending colon, removed with a                        cold biopsy forceps. Resected and retrieved.                       - One 7 mm polyp in the transverse colon, removed with                        a hot snare. Resected and retrieved.                       - One 6 mm polyp in the transverse colon, removed with                        a cold snare. Resected and retrieved.                       - One 15 mm polyp in the transverse colon, removed                        using injection-lift and a hot snare and removed                        piecemeal using a hot snare. Resected and retrieved.                        Clip was placed.                       - One 10 mm polyp in the sigmoid colon, removed with a                        hot snare. Resected and retrieved. Clip was placed.                       - Diverticulosis in the sigmoid colon.                       - Non-bleeding internal hemorrhoids.                       - The examination was otherwise normal on direct and                        retroflexion views. Recommendation:       - Discharge patient to home (with escort).                       - Resume previous diet.                       - No aspirin, ibuprofen, naproxen, or other  non-steroidal anti-inflammatory drugs for 2 weeks after                        polyp removal.                       - Await pathology results.                       - Repeat colonoscopy in 6 months for surveillance after                        piecemeal polypectomy. Procedure Code(s):    --- Professional ---                       [phone], Colonoscopy, flexible; with removal of tumor(s),                        polyp(s), or other lesion(s) by snare technique                       45380, 19, Colonoscopy, flexible; with biopsy, single                         or multiple Diagnosis Code(s):    --- Professional ---                       Z86.010, Personal history of colonic polyps                       D12.2, Benign neoplasm of ascending colon                       D12.3, Benign neoplasm of transverse colon (hepatic                        flexure or splenic flexure)                       D12.5, Benign neoplasm of sigmoid colon                       K64.0, First degree hemorrhoids                       K57.30, Diverticulosis of large intestine without                        perforation or abscess without bleeding CPT copyright 2016 American Medical Association. All rights reserved. The codes documented in this report are preliminary and upon coder review may  be revised to meet current compliance requirements. [name], MD [name] MD, MD 07/27/2016 10:09:05 AM This report has been signed electronically. Number of Addenda: 0 Note Initiated On: 07/27/2016 9:08 AM Scope Withdrawal Time: 0 hours 27 minutes 11 seconds  Total Procedure Duration: 0 hours 29 minutes 51 seconds       [facility]

## 2016-07-30 ENCOUNTER — Encounter: Payer: Self-pay | Admitting: Gastroenterology

## 2016-07-30 LAB — SURGICAL PATHOLOGY

## 2016-08-02 ENCOUNTER — Telehealth: Payer: Self-pay

## 2016-08-02 NOTE — Telephone Encounter (Signed)
-----   Message from [name], MD sent at 07/30/2016 10:51 AM EST ----- Multiple adenomas- recall colonoscopy in 6 months as taken out piece meal

## 2016-08-02 NOTE — Telephone Encounter (Signed)
Pt notified of colonoscopy results. Pt has been added to recall to contact in July.

## 2016-08-17 ENCOUNTER — Telehealth: Payer: Self-pay | Admitting: Family Medicine

## 2016-08-17 NOTE — Telephone Encounter (Signed)
Called Pt to schedule AWV with [facility] - knb °

## 2016-12-26 ENCOUNTER — Ambulatory Visit (INDEPENDENT_AMBULATORY_CARE_PROVIDER_SITE_OTHER): Payer: Medicare Other | Admitting: Family Medicine

## 2016-12-26 VITALS — BP 122/70 | HR 80 | Temp 98.1°F | Resp 16

## 2016-12-26 DIAGNOSIS — L989 Disorder of the skin and subcutaneous tissue, unspecified: Secondary | ICD-10-CM | POA: Diagnosis not present

## 2016-12-26 DIAGNOSIS — H2513 Age-related nuclear cataract, bilateral: Secondary | ICD-10-CM | POA: Diagnosis not present

## 2016-12-26 DIAGNOSIS — Z85828 Personal history of other malignant neoplasm of skin: Secondary | ICD-10-CM

## 2016-12-27 NOTE — Progress Notes (Signed)
Name: Jonathan Greer   MRN: [id number]    DOB: 01/02/1946   Date:12/27/2016       Progress Note  Subjective  Chief Complaint  Chief Complaint  Patient presents with  . Skin Problem    HPI  Patient with history of basal cell carcinoma of left temple presents today for new skin lesion on the side of his nose present for the last couple of months. Is not painful and not draining.   He was initially evaluated by Dr. Aubery Lapping in March of 2017 and subsequently had Mohs surgery at [facility] in July of 2017.    Past Medical History:  Diagnosis Date  . Hyperlipidemia   . Shingles 07/07/2015    Social History  Substance Use Topics  . Smoking status: Former Smoker    Packs/day: 2.00    Years: 20.00    Types: Cigarettes    Quit date: 06/18/1974  . Smokeless tobacco: Never Used     Comment: Quit in the 32s  . Alcohol use No    No current outpatient prescriptions on file.  No Known Allergies  Review of Systems  Constitutional: Negative for chills, fever and weight loss.  Skin: Negative for itching and rash.      Objective   Vitals:   12/26/16 1618  BP: 122/70  Pulse: 80  Resp: 16  Temp: 98.1 F (36.7 C)     Physical Exam  Small, approx 34mm flesh raised flesh colored lesion and crease of left nasal bridge. No erythema.     Assessment & Plan  1. Skin lesion of face Considering history he is at hight risk for skin cancer.  - Ambulatory referral to Dermatology  2. History of basal cell carcinoma of skin

## 2017-01-01 DIAGNOSIS — D485 Neoplasm of uncertain behavior of skin: Secondary | ICD-10-CM | POA: Diagnosis not present

## 2017-01-01 DIAGNOSIS — Z85828 Personal history of other malignant neoplasm of skin: Secondary | ICD-10-CM | POA: Diagnosis not present

## 2017-01-01 DIAGNOSIS — L821 Other seborrheic keratosis: Secondary | ICD-10-CM | POA: Diagnosis not present

## 2017-01-01 DIAGNOSIS — C44311 Basal cell carcinoma of skin of nose: Secondary | ICD-10-CM | POA: Diagnosis not present

## 2017-01-01 DIAGNOSIS — C44319 Basal cell carcinoma of skin of other parts of face: Secondary | ICD-10-CM | POA: Diagnosis not present

## 2017-01-10 ENCOUNTER — Other Ambulatory Visit: Payer: Self-pay

## 2017-01-10 DIAGNOSIS — Z86010 Personal history of colonic polyps: Secondary | ICD-10-CM

## 2017-01-15 ENCOUNTER — Encounter: Payer: Self-pay | Admitting: Emergency Medicine

## 2017-01-15 ENCOUNTER — Emergency Department: Payer: Medicare Other

## 2017-01-15 ENCOUNTER — Emergency Department
Admission: EM | Admit: 2017-01-15 | Discharge: 2017-01-15 | Disposition: A | Discharge status: Home / Self Care | Payer: Medicare Other | Attending: Emergency Medicine | Admitting: Emergency Medicine

## 2017-01-15 DIAGNOSIS — S61032A Puncture wound without foreign body of left thumb without damage to nail, initial encounter: Secondary | ICD-10-CM

## 2017-01-15 DIAGNOSIS — Z23 Encounter for immunization: Secondary | ICD-10-CM | POA: Insufficient documentation

## 2017-01-15 DIAGNOSIS — Y939 Activity, unspecified: Secondary | ICD-10-CM | POA: Diagnosis not present

## 2017-01-15 DIAGNOSIS — S60352A Superficial foreign body of left thumb, initial encounter: Secondary | ICD-10-CM

## 2017-01-15 DIAGNOSIS — Y998 Other external cause status: Secondary | ICD-10-CM | POA: Diagnosis not present

## 2017-01-15 DIAGNOSIS — M79645 Pain in left finger(s): Secondary | ICD-10-CM | POA: Diagnosis not present

## 2017-01-15 DIAGNOSIS — M60242 Foreign body granuloma of soft tissue, not elsewhere classified, left hand: Secondary | ICD-10-CM | POA: Insufficient documentation

## 2017-01-15 DIAGNOSIS — Y929 Unspecified place or not applicable: Secondary | ICD-10-CM | POA: Insufficient documentation

## 2017-01-15 DIAGNOSIS — S6992XA Unspecified injury of left wrist, hand and finger(s), initial encounter: Secondary | ICD-10-CM | POA: Diagnosis present

## 2017-01-15 DIAGNOSIS — W294XXA Contact with nail gun, initial encounter: Secondary | ICD-10-CM | POA: Diagnosis not present

## 2017-01-15 DIAGNOSIS — Z87891 Personal history of nicotine dependence: Secondary | ICD-10-CM | POA: Diagnosis not present

## 2017-01-15 DIAGNOSIS — Z1811 Retained magnetic metal fragments: Secondary | ICD-10-CM | POA: Diagnosis not present

## 2017-01-15 DIAGNOSIS — L923 Foreign body granuloma of the skin and subcutaneous tissue: Secondary | ICD-10-CM | POA: Diagnosis not present

## 2017-01-15 DIAGNOSIS — S61042A Puncture wound with foreign body of left thumb without damage to nail, initial encounter: Secondary | ICD-10-CM | POA: Diagnosis not present

## 2017-01-15 MED ORDER — BACITRACIN ZINC 500 UNIT/GM EX OINT
TOPICAL_OINTMENT | Freq: Two times a day (BID) | CUTANEOUS | Status: DC
  Filled 2017-01-15: qty 0.9

## 2017-01-15 MED ORDER — SULFAMETHOXAZOLE-TRIMETHOPRIM 800-160 MG PO TABS
1.0000 | ORAL_TABLET | Freq: Once | ORAL | Status: AC
  Administered 2017-01-15: 1 via ORAL
  Filled 2017-01-15: qty 1

## 2017-01-15 MED ORDER — OXYCODONE-ACETAMINOPHEN 5-325 MG PO TABS
1.0000 | ORAL_TABLET | Freq: Once | ORAL | Status: AC
  Administered 2017-01-15: 1 via ORAL
  Filled 2017-01-15: qty 1

## 2017-01-15 MED ORDER — TETANUS-DIPHTH-ACELL PERTUSSIS 5-2.5-18.5 LF-MCG/0.5 IM SUSP
INTRAMUSCULAR | Status: AC
  Administered 2017-01-15: 0.5 mL via INTRAMUSCULAR
  Filled 2017-01-15: qty 0.5

## 2017-01-15 MED ORDER — LIDOCAINE HCL (PF) 1 % IJ SOLN
INTRAMUSCULAR | Status: AC
  Filled 2017-01-15: qty 5

## 2017-01-15 MED ORDER — SULFAMETHOXAZOLE-TRIMETHOPRIM 800-160 MG PO TABS: 1.0000 | ORAL_TABLET | Freq: Two times a day (BID) | ORAL | 0 refills | Status: DC

## 2017-01-15 MED ORDER — TETANUS-DIPHTHERIA TOXOIDS TD 5-2 LFU IM INJ: 0.5000 mL | INJECTION | Freq: Once | INTRAMUSCULAR | Status: DC

## 2017-01-15 MED ORDER — OXYCODONE-ACETAMINOPHEN 5-325 MG PO TABS: 1.0000 | ORAL_TABLET | Freq: Four times a day (QID) | ORAL | 0 refills | Status: DC | PRN

## 2017-01-15 MED ORDER — TETANUS-DIPHTH-ACELL PERTUSSIS 5-2.5-18.5 LF-MCG/0.5 IM SUSP
0.5000 mL | Freq: Once | INTRAMUSCULAR | Status: AC
  Administered 2017-01-15: 0.5 mL via INTRAMUSCULAR

## 2017-01-15 NOTE — ED Provider Notes (Signed)
[facility] Emergency Department Provider Note   ____________________________________________   First MD Initiated Contact with Patient 01/15/17 1148     (approximate)  I have reviewed the triage vital signs and the nursing notes.   HISTORY  Chief Complaint Foreign Body    HPI Jonathan Greer is a 71 y.o. male patient presents with  nail embedded in the left thumb. Patient actually shot himself with a nail gun.Patient rates his pain as a 3/10. Patient described a pain as "achy". Patient denies loss sensation or loss of function of the left thumb. Patient tetanus shot is not up-to-date. Patient is right-hand dominant.   Past Medical History:  Diagnosis Date  . Hyperlipidemia   . Shingles 07/07/2015    Patient Active Problem List   Diagnosis Date Noted  . Diverticulosis of large intestine without diverticulitis   . First degree hemorrhoids   . Benign neoplasm of transverse colon   . Hx of colonic polyps   . Benign neoplasm of ascending colon   . History of basal cell carcinoma of skin 01/17/2016  . Hyperlipemia 07/07/2015  . 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 Kidney stone 07/07/2015  . Polyp of sigmoid colon 07/07/2015    Past Surgical History:  Procedure Laterality Date  . COLONOSCOPY WITH PROPOFOL N/A 07/27/2016   Procedure: COLONOSCOPY WITH PROPOFOL;  Surgeon: [name], MD;  Location: [facility] ENDOSCOPY;  Service: Endoscopy;  Laterality: N/A;  . HERNIA REPAIR    . MOHS SURGERY  01/10/2016   [facility] for [facility]  . TONSILLECTOMY AND ADENOIDECTOMY  1960's    Prior to Admission medications   Medication Sig Start Date End Date Taking? Authorizing Provider  oxyCODONE-acetaminophen (ROXICET) 5-325 MG tablet Take 1 tablet by mouth every 6 (six) hours as needed for moderate pain. 01/15/17   Sable Feil, PA-C  sulfamethoxazole-trimethoprim (BACTRIM DS,SEPTRA DS) 800-160 MG tablet Take 1 tablet by mouth 2 (two) times daily. 01/15/17   Sable Feil, PA-C    Allergies Patient has no known allergies.  Family History  Problem Relation Age of Onset  . Heart failure Mother   . CVA Father   . Lung cancer Brother   . Liver cancer Maternal Grandmother   . Cancer Paternal Grandmother   . Cancer Paternal Grandfather     Social History Social History  Substance Use Topics  . Smoking status: Former Smoker    Packs/day: 2.00    Years: 20.00    Types: Cigarettes    Quit date: 06/18/1974  . Smokeless tobacco: Never Used     Comment: Quit in the 42s  . Alcohol use No    Review of Systems  Constitutional: No fever/chills Eyes: No visual changes. ENT: No sore throat. Cardiovascular: Denies chest pain. Respiratory: Denies shortness of breath. Gastrointestinal: No abdominal pain.  No nausea, no vomiting.  No diarrhea.  No constipation. Genitourinary: Negative for dysuria. Musculoskeletal: Negative for back pain. Skin: Negative for rash. Foreign body left thumb Neurological: Negative for headaches, focal weakness or numbness. Endocrine:Hyperlipidemia  ____________________________________________   PHYSICAL EXAM:  VITAL SIGNS: ED Triage Vitals  Enc Vitals Group     BP --      Pulse Rate 01/15/17 1115 69     Resp 01/15/17 1115 18     Temp 01/15/17 1115 97.8 F (36.6 C)     Temp Source 01/15/17 1115 Oral     SpO2 01/15/17 1115 97 %     Weight 01/15/17 1114 240  lb (108.9 kg)     Height --      Head Circumference --      Peak Flow --      Pain Score 01/15/17 1113 3     Pain Loc --      Pain Edu? --      Excl. in [place]? --     Constitutional: Alert and oriented. Well appearing and in no acute distress. Cardiovascular: Normal rate, regular rhythm. Grossly normal heart sounds.  Good peripheral circulation. Respiratory: Normal respiratory effort.  No retractions. Lungs CTAB. Musculoskeletal: No lower extremity tenderness nor edema.  No joint effusions. Neurologic:  Normal speech and language. No gross focal  neurologic deficits are appreciated. No gait instability. Skin:  Skin is warm, dry and intact. No rash noted. Foreign body consistent of a nail embedded in the left thumb with entrance and exit wound. Psychiatric: Mood and affect are normal. Speech and behavior are normal.  ____________________________________________   LABS (all labs ordered are listed, but only abnormal results are displayed)  Labs Reviewed - No data to display ____________________________________________  EKG   ____________________________________________  RADIOLOGY  Dg Finger Thumb Left  Result Date: 01/15/2017 CLINICAL DATA:  Nail and left thumb.  Pain. EXAM: LEFT THUMB 2+V COMPARISON:  No recent prior. FINDINGS: A nail is noted within the soft tissues of the anterior ulnar aspect of the left thumb. Definite bony involvement is not identified. No acute fracture. Diffuse degenerative change. IMPRESSION: A nail is noted in the soft tissues of the thumb. Definite bony involvement is not demonstrated. Follow-up imaging following removal suggested . Electronically Signed   By: Marcello Moores  Register   On: 01/15/2017 12:04   X-ray left thumb reveals embedded nail without bony involvement. ____________________________________________   PROCEDURES  Procedure(s) performed: None  Procedures  Critical Care performed: No  ____________________________________________   INITIAL IMPRESSION / ASSESSMENT AND PLAN / ED COURSE  Pertinent labs & imaging results that were available during my care of the patient were reviewed by me and considered in my medical decision making (see chart for details).  Foreign body consistent of a nail embedded into the left thumb. Discussed x-ray finding with patient. Status post digital block and surgical prep nail was removed intact. Patient have full nuchal range of motion of the thumb and sensation was intact. Patient given discharge care instructions. Patient given a tetanus booster before  departure. Patient advised to take antibiotics as directed and follow-up with PCP in 5 days. Return by ED if condition worsens.      ____________________________________________   FINAL CLINICAL IMPRESSION(S) / ED DIAGNOSES  Final diagnoses:  Foreign body granuloma of soft tissue, NEC, left hand  Foreign body of left thumb, initial encounter  Puncture wound of left thumb, initial encounter      NEW MEDICATIONS STARTED DURING THIS VISIT:  New Prescriptions   OXYCODONE-ACETAMINOPHEN (ROXICET) 5-325 MG TABLET    Take 1 tablet by mouth every 6 (six) hours as needed for moderate pain.   SULFAMETHOXAZOLE-TRIMETHOPRIM (BACTRIM DS,SEPTRA DS) 800-160 MG TABLET    Take 1 tablet by mouth 2 (two) times daily.     Note:  This document was prepared using Dragon voice recognition software and may include unintentional dictation errors.    Sable Feil, PA-C 01/15/17 1227    Schaevitz, Randall An, MD 01/15/17 [phone]

## 2017-01-15 NOTE — ED Triage Notes (Signed)
Pt with nail in left thumb with nail gun prior to arrival.  CMS intact.

## 2017-02-25 DIAGNOSIS — H2513 Age-related nuclear cataract, bilateral: Secondary | ICD-10-CM | POA: Diagnosis not present

## 2017-02-27 ENCOUNTER — Telehealth: Payer: Self-pay | Admitting: Family Medicine

## 2017-02-27 ENCOUNTER — Other Ambulatory Visit: Payer: Self-pay

## 2017-02-27 ENCOUNTER — Telehealth: Payer: Self-pay | Admitting: Gastroenterology

## 2017-02-27 NOTE — Telephone Encounter (Signed)
Patients wife needs to reschedule his procedure due to another surgery. Please call her to reschedule.

## 2017-03-05 DIAGNOSIS — H2512 Age-related nuclear cataract, left eye: Secondary | ICD-10-CM | POA: Diagnosis not present

## 2017-03-06 ENCOUNTER — Encounter: Payer: Self-pay | Admitting: *Deleted

## 2017-03-07 NOTE — Discharge Instructions (Signed)

## 2017-03-13 ENCOUNTER — Ambulatory Visit: Payer: Medicare Other | Admitting: Anesthesiology

## 2017-03-13 ENCOUNTER — Encounter
Admission: RE | Disposition: A | Discharge status: Home / Self Care | Payer: Self-pay | Source: Ambulatory Visit | Attending: Ophthalmology

## 2017-03-13 ENCOUNTER — Ambulatory Visit
Admission: RE | Admit: 2017-03-13 | Discharge: 2017-03-13 | Disposition: A | Discharge status: Home / Self Care | Payer: Medicare Other | Source: Ambulatory Visit | Attending: Ophthalmology | Admitting: Ophthalmology

## 2017-03-13 DIAGNOSIS — H2512 Age-related nuclear cataract, left eye: Secondary | ICD-10-CM | POA: Insufficient documentation

## 2017-03-13 DIAGNOSIS — Z87891 Personal history of nicotine dependence: Secondary | ICD-10-CM | POA: Insufficient documentation

## 2017-03-13 DIAGNOSIS — Z6835 Body mass index (BMI) 35.0-35.9, adult: Secondary | ICD-10-CM | POA: Insufficient documentation

## 2017-03-13 DIAGNOSIS — Z85828 Personal history of other malignant neoplasm of skin: Secondary | ICD-10-CM | POA: Diagnosis not present

## 2017-03-13 DIAGNOSIS — M1990 Unspecified osteoarthritis, unspecified site: Secondary | ICD-10-CM | POA: Diagnosis not present

## 2017-03-13 HISTORY — DX: Presence of dental prosthetic device (complete) (partial): Z97.2

## 2017-03-13 HISTORY — PX: CATARACT EXTRACTION W/PHACO: SHX586

## 2017-03-13 HISTORY — DX: Basal cell carcinoma of skin, unspecified: C44.91

## 2017-03-13 SURGERY — PHACOEMULSIFICATION, CATARACT, WITH IOL INSERTION
Anesthesia: Monitor Anesthesia Care | Laterality: Left | Wound class: Clean

## 2017-03-13 MED ORDER — NA HYALUR & NA CHOND-NA HYALUR 0.4-0.35 ML IO KIT
PACK | INTRAOCULAR | Status: DC | PRN
  Administered 2017-03-13: 1 mL via INTRAOCULAR

## 2017-03-13 MED ORDER — MOXIFLOXACIN HCL 0.5 % OP SOLN
1.0000 [drp] | OPHTHALMIC | Status: DC | PRN
  Administered 2017-03-13 (×3): 1 [drp] via OPHTHALMIC

## 2017-03-13 MED ORDER — LACTATED RINGERS IV SOLN: 10.0000 mL/h | INTRAVENOUS | Status: DC

## 2017-03-13 MED ORDER — BRIMONIDINE TARTRATE-TIMOLOL 0.2-0.5 % OP SOLN
OPHTHALMIC | Status: DC | PRN
  Administered 2017-03-13: 1 [drp] via OPHTHALMIC

## 2017-03-13 MED ORDER — ACETAMINOPHEN 325 MG PO TABS: 325.0000 mg | ORAL_TABLET | ORAL | Status: DC | PRN

## 2017-03-13 MED ORDER — MIDAZOLAM HCL 2 MG/2ML IJ SOLN
INTRAMUSCULAR | Status: DC | PRN
  Administered 2017-03-13: 2 mg via INTRAVENOUS

## 2017-03-13 MED ORDER — LIDOCAINE HCL (PF) 2 % IJ SOLN
INTRAOCULAR | Status: DC | PRN
  Administered 2017-03-13: 1 mL via INTRAMUSCULAR

## 2017-03-13 MED ORDER — FENTANYL CITRATE (PF) 100 MCG/2ML IJ SOLN
INTRAMUSCULAR | Status: DC | PRN
  Administered 2017-03-13 (×2): 50 ug via INTRAVENOUS

## 2017-03-13 MED ORDER — ONDANSETRON HCL 4 MG/2ML IJ SOLN: 4.0000 mg | Freq: Once | INTRAMUSCULAR | Status: DC | PRN

## 2017-03-13 MED ORDER — ARMC OPHTHALMIC DILATING DROPS
1.0000 "application " | OPHTHALMIC | Status: DC | PRN
  Administered 2017-03-13 (×3): 1 via OPHTHALMIC

## 2017-03-13 MED ORDER — ACETAMINOPHEN 160 MG/5ML PO SOLN: 325.0000 mg | ORAL | Status: DC | PRN

## 2017-03-13 MED ORDER — EPINEPHRINE PF 1 MG/ML IJ SOLN
INTRAOCULAR | Status: DC | PRN
  Administered 2017-03-13: 97 mL via OPHTHALMIC

## 2017-03-13 MED ORDER — CEFUROXIME OPHTHALMIC INJECTION 1 MG/0.1 ML
INJECTION | OPHTHALMIC | Status: DC | PRN
  Administered 2017-03-13: 1 mg via INTRACAMERAL

## 2017-03-13 MED ORDER — HYDRALAZINE HCL 20 MG/ML IJ SOLN
INTRAMUSCULAR | Status: DC | PRN
  Administered 2017-03-13: 10 mg via INTRAVENOUS

## 2017-03-13 SURGICAL SUPPLY — 25 items
CANNULA ANT/CHMB 27GA (MISCELLANEOUS) ×3 IMPLANT
CARTRIDGE ABBOTT (MISCELLANEOUS) IMPLANT
GLOVE SURG LX 7.5 STRW (GLOVE) ×2
GLOVE SURG LX STRL 7.5 STRW (GLOVE) ×1 IMPLANT
GLOVE SURG TRIUMPH 8.0 PF LTX (GLOVE) ×3 IMPLANT
GOWN STRL REUS W/ TWL LRG LVL3 (GOWN DISPOSABLE) ×2 IMPLANT
GOWN STRL REUS W/TWL LRG LVL3 (GOWN DISPOSABLE) ×4
LENS IOL TECNIS ITEC 22.0 (Intraocular Lens) ×3 IMPLANT
MARKER SKIN DUAL TIP RULER LAB (MISCELLANEOUS) ×3 IMPLANT
NDL RETROBULBAR .5 NSTRL (NEEDLE) IMPLANT
NEEDLE FILTER BLUNT 18X 1/2SAF (NEEDLE) ×2
NEEDLE FILTER BLUNT 18X1 1/2 (NEEDLE) ×1 IMPLANT
PACK CATARACT BRASINGTON (MISCELLANEOUS) ×3 IMPLANT
PACK EYE AFTER SURG (MISCELLANEOUS) ×3 IMPLANT
PACK OPTHALMIC (MISCELLANEOUS) ×3 IMPLANT
RING MALYGIN 7.0 (MISCELLANEOUS) IMPLANT
SUT ETHILON 10-0 CS-B-6CS-B-6 (SUTURE)
SUT VICRYL  9 0 (SUTURE)
SUT VICRYL 9 0 (SUTURE) IMPLANT
SUTURE EHLN 10-0 CS-B-6CS-B-6 (SUTURE) IMPLANT
SYR 3ML LL SCALE MARK (SYRINGE) ×3 IMPLANT
SYR 5ML LL (SYRINGE) ×3 IMPLANT
SYR TB 1ML LUER SLIP (SYRINGE) ×3 IMPLANT
WATER STERILE IRR 250ML POUR (IV SOLUTION) ×3 IMPLANT
WIPE NON LINTING 3.25X3.25 (MISCELLANEOUS) ×3 IMPLANT

## 2017-03-13 NOTE — Op Note (Signed)
OPERATIVE NOTE  Jonathan Greer [id number] 03/13/2017   PREOPERATIVE DIAGNOSIS:  Nuclear sclerotic cataract left eye. H25.12   POSTOPERATIVE DIAGNOSIS:    Nuclear sclerotic cataract left eye.     PROCEDURE:  Phacoemusification with posterior chamber intraocular lens placement of the left eye   LENS:   Implant Name Type Inv. Item Serial No. Manufacturer Lot No. LRB No. Used  LENS IOL DIOP 22.0 - [id number] Intraocular Lens LENS IOL DIOP 22.0 [id number]   Left 1        ULTRASOUND TIME: 21  % of 1 minutes 8 seconds, CDE 14.8  SURGEON:  Wyonia Hough, MD   ANESTHESIA:  Topical with tetracaine drops and 2% Xylocaine jelly, augmented with 1% preservative-free intracameral lidocaine.    COMPLICATIONS:  None.   DESCRIPTION OF PROCEDURE:  The patient was identified in the holding room and transported to the operating room and placed in the supine position under the operating microscope.  The left eye was identified as the operative eye and it was prepped and draped in the usual sterile ophthalmic fashion.   A 1 millimeter clear-corneal paracentesis was made at the 1:30 position.  0.5 ml of preservative-free 1% lidocaine was injected into the anterior chamber.  The anterior chamber was filled with Viscoat viscoelastic.  A 2.4 millimeter keratome was used to make a near-clear corneal incision at the 10:30 position.  .  A curvilinear capsulorrhexis was made with a cystotome and capsulorrhexis forceps.  Balanced salt solution was used to hydrodissect and hydrodelineate the nucleus.   Phacoemulsification was then used in stop and chop fashion to remove the lens nucleus and epinucleus.  The remaining cortex was then removed using the irrigation and aspiration handpiece. Provisc was then placed into the capsular bag to distend it for lens placement.  A lens was then injected into the capsular bag.  The remaining viscoelastic was aspirated.   Wounds were hydrated with balanced salt  solution.  The anterior chamber was inflated to a physiologic pressure with balanced salt solution.  No wound leaks were noted. Cefuroxime 0.1 ml of a 10mg /ml solution was injected into the anterior chamber for a dose of 1 mg of intracameral antibiotic at the completion of the case.   Timolol and Brimonidine drops were applied to the eye.  The patient was taken to the recovery room in stable condition without complications of anesthesia or surgery.  [name] 03/13/2017, 10:49 AM

## 2017-03-13 NOTE — Anesthesia Preprocedure Evaluation (Signed)
Anesthesia Evaluation  Patient identified by MRN, date of birth, ID band  Reviewed: NPO status   History of Anesthesia Complications Negative for: history of anesthetic complications  Airway Mallampati: II  TM Distance: >3 FB Neck ROM: full    Dental  (+) Upper Dentures, Lower Dentures   Pulmonary neg pulmonary ROS, former smoker,    Pulmonary exam normal        Cardiovascular Exercise Tolerance: Good negative cardio ROS Normal cardiovascular exam     Neuro/Psych negative neurological ROS  negative psych ROS   GI/Hepatic negative GI ROS, Neg liver ROS,   Endo/Other  negative endocrine ROSMorbid obesity (bmi=35)  Renal/GU negative Renal ROS  negative genitourinary   Musculoskeletal   Abdominal   Peds  Hematology Basal cell carcinoma   Anesthesia Other Findings   Reproductive/Obstetrics                             Anesthesia Physical Anesthesia Plan  ASA: II  Anesthesia Plan: MAC   Post-op Pain Management:    Induction:   PONV Risk Score and Plan:   Airway Management Planned:   Additional Equipment:   Intra-op Plan:   Post-operative Plan:   Informed Consent: I have reviewed the patients History and Physical, chart, labs and discussed the procedure including the risks, benefits and alternatives for the proposed anesthesia with the patient or authorized representative who has indicated his/her understanding and acceptance.     Plan Discussed with: CRNA  Anesthesia Plan Comments:         Anesthesia Quick Evaluation

## 2017-03-13 NOTE — Transfer of Care (Signed)
Immediate Anesthesia Transfer of Care Note  Patient: Jonathan Greer  Procedure(s) Performed: Procedure(s) with comments: CATARACT EXTRACTION PHACO AND INTRAOCULAR LENS PLACEMENT (IOC) LEFT (Left) - IVA TOPICAL LEFT  Patient Location: PACU  Anesthesia Type: MAC  Level of Consciousness: awake, alert  and patient cooperative  Airway and Oxygen Therapy: Patient Spontanous Breathing and Patient connected to supplemental oxygen  Post-op Assessment: Post-op Vital signs reviewed, Patient's Cardiovascular Status Stable, Respiratory Function Stable, Patent Airway and No signs of Nausea or vomiting  Post-op Vital Signs: Reviewed and stable  Complications: No apparent anesthesia complications

## 2017-03-13 NOTE — Anesthesia Procedure Notes (Signed)
Procedure Name: MAC Performed by: Shiro Ellerman Pre-anesthesia Checklist: Patient identified, Emergency Drugs available, Suction available, Timeout performed and Patient being monitored Patient Re-evaluated:Patient Re-evaluated prior to inductionOxygen Delivery Method: Nasal cannula Placement Confirmation: positive ETCO2     

## 2017-03-13 NOTE — H&P (Signed)
The History and Physical notes are on paper, have been signed, and are to be scanned. The patient remains stable and unchanged from the H&P.   Previous H&P reviewed, patient examined, and there are no changes.  Jonathan Greer 03/13/2017 10:30 AM

## 2017-03-13 NOTE — Anesthesia Postprocedure Evaluation (Addendum)
Anesthesia Post Note  Patient: Jonathan Greer  Procedure(s) Performed: Procedure(s) (LRB): CATARACT EXTRACTION PHACO AND INTRAOCULAR LENS PLACEMENT (IOC) LEFT (Left)  Patient location during evaluation: PACU Anesthesia Type: MAC Level of consciousness: awake and alert Pain management: pain level controlled Vital Signs Assessment: post-procedure vital signs reviewed and stable Respiratory status: spontaneous breathing, nonlabored ventilation, respiratory function stable and patient connected to nasal cannula oxygen Cardiovascular status: stable and blood pressure returned to baseline Postop Assessment: no apparent nausea or vomiting Anesthetic complications: no Comments: PT to f/u with PCP regarding BP control.    Fidel Levy

## 2017-03-13 NOTE — Addendum Note (Signed)
Addendum  created 03/13/17 1105 by Fidel Levy, MD   Sign clinical note

## 2017-03-14 ENCOUNTER — Encounter: Payer: Self-pay | Admitting: Ophthalmology

## 2017-03-15 ENCOUNTER — Telehealth: Payer: Self-pay | Admitting: Gastroenterology

## 2017-03-15 NOTE — Telephone Encounter (Signed)
Patient LVM to cancel his procedure on 03/21/17 he will call back to r/s at a later date.

## 2017-03-21 ENCOUNTER — Ambulatory Visit: Admission: RE | Admit: 2017-03-21 | Payer: Medicare Other | Source: Ambulatory Visit | Admitting: Gastroenterology

## 2017-03-21 ENCOUNTER — Encounter: Admission: RE | Payer: Self-pay | Source: Ambulatory Visit

## 2017-03-21 SURGERY — COLONOSCOPY WITH PROPOFOL
Anesthesia: General

## 2017-04-02 DIAGNOSIS — C44311 Basal cell carcinoma of skin of nose: Secondary | ICD-10-CM | POA: Diagnosis not present

## 2017-04-02 DIAGNOSIS — C44319 Basal cell carcinoma of skin of other parts of face: Secondary | ICD-10-CM | POA: Diagnosis not present

## 2017-04-11 DIAGNOSIS — H2511 Age-related nuclear cataract, right eye: Secondary | ICD-10-CM | POA: Diagnosis not present

## 2017-04-12 NOTE — Discharge Instructions (Signed)

## 2017-04-17 ENCOUNTER — Ambulatory Visit: Payer: Medicare Other | Admitting: Anesthesiology

## 2017-04-17 ENCOUNTER — Ambulatory Visit
Admission: RE | Admit: 2017-04-17 | Discharge: 2017-04-17 | Disposition: A | Discharge status: Home / Self Care | Payer: Medicare Other | Source: Ambulatory Visit | Attending: Ophthalmology | Admitting: Ophthalmology

## 2017-04-17 ENCOUNTER — Encounter
Admission: RE | Disposition: A | Discharge status: Home / Self Care | Payer: Self-pay | Source: Ambulatory Visit | Attending: Ophthalmology

## 2017-04-17 DIAGNOSIS — Z85828 Personal history of other malignant neoplasm of skin: Secondary | ICD-10-CM | POA: Diagnosis not present

## 2017-04-17 DIAGNOSIS — M1990 Unspecified osteoarthritis, unspecified site: Secondary | ICD-10-CM | POA: Diagnosis not present

## 2017-04-17 DIAGNOSIS — Z6835 Body mass index (BMI) 35.0-35.9, adult: Secondary | ICD-10-CM | POA: Diagnosis not present

## 2017-04-17 DIAGNOSIS — E785 Hyperlipidemia, unspecified: Secondary | ICD-10-CM | POA: Diagnosis not present

## 2017-04-17 DIAGNOSIS — K5790 Diverticulosis of intestine, part unspecified, without perforation or abscess without bleeding: Secondary | ICD-10-CM | POA: Insufficient documentation

## 2017-04-17 DIAGNOSIS — Z87891 Personal history of nicotine dependence: Secondary | ICD-10-CM | POA: Insufficient documentation

## 2017-04-17 DIAGNOSIS — H9190 Unspecified hearing loss, unspecified ear: Secondary | ICD-10-CM | POA: Insufficient documentation

## 2017-04-17 DIAGNOSIS — Z9842 Cataract extraction status, left eye: Secondary | ICD-10-CM | POA: Insufficient documentation

## 2017-04-17 DIAGNOSIS — H2511 Age-related nuclear cataract, right eye: Secondary | ICD-10-CM | POA: Diagnosis not present

## 2017-04-17 HISTORY — DX: Hyperlipidemia, unspecified: E78.5

## 2017-04-17 HISTORY — PX: CATARACT EXTRACTION W/PHACO: SHX586

## 2017-04-17 SURGERY — PHACOEMULSIFICATION, CATARACT, WITH IOL INSERTION
Anesthesia: Monitor Anesthesia Care | Laterality: Right | Wound class: Clean

## 2017-04-17 MED ORDER — CEFUROXIME OPHTHALMIC INJECTION 1 MG/0.1 ML
INJECTION | OPHTHALMIC | Status: DC | PRN
  Administered 2017-04-17: 0.1 mL via INTRACAMERAL

## 2017-04-17 MED ORDER — BRIMONIDINE TARTRATE-TIMOLOL 0.2-0.5 % OP SOLN
OPHTHALMIC | Status: DC | PRN
  Administered 2017-04-17: 1 [drp] via OPHTHALMIC

## 2017-04-17 MED ORDER — FENTANYL CITRATE (PF) 100 MCG/2ML IJ SOLN
INTRAMUSCULAR | Status: DC | PRN
  Administered 2017-04-17: 50 ug via INTRAVENOUS

## 2017-04-17 MED ORDER — LIDOCAINE HCL (PF) 2 % IJ SOLN
INTRAOCULAR | Status: DC | PRN
  Administered 2017-04-17: 1 mL via INTRAMUSCULAR

## 2017-04-17 MED ORDER — LACTATED RINGERS IV SOLN: 10.0000 mL/h | INTRAVENOUS | Status: DC

## 2017-04-17 MED ORDER — ACETAMINOPHEN 325 MG PO TABS: 325.0000 mg | ORAL_TABLET | ORAL | Status: DC | PRN

## 2017-04-17 MED ORDER — ARMC OPHTHALMIC DILATING DROPS
1.0000 "application " | OPHTHALMIC | Status: DC | PRN
  Administered 2017-04-17 (×3): 1 via OPHTHALMIC

## 2017-04-17 MED ORDER — MOXIFLOXACIN HCL 0.5 % OP SOLN
1.0000 [drp] | OPHTHALMIC | Status: DC | PRN
  Administered 2017-04-17 (×3): 1 [drp] via OPHTHALMIC

## 2017-04-17 MED ORDER — MIDAZOLAM HCL 2 MG/2ML IJ SOLN
INTRAMUSCULAR | Status: DC | PRN
  Administered 2017-04-17: 2 mg via INTRAVENOUS

## 2017-04-17 MED ORDER — EPINEPHRINE PF 1 MG/ML IJ SOLN
INTRAOCULAR | Status: DC | PRN
  Administered 2017-04-17: 57 mL via OPHTHALMIC

## 2017-04-17 MED ORDER — ONDANSETRON HCL 4 MG/2ML IJ SOLN: 4.0000 mg | Freq: Once | INTRAMUSCULAR | Status: DC | PRN

## 2017-04-17 MED ORDER — NA HYALUR & NA CHOND-NA HYALUR 0.4-0.35 ML IO KIT
PACK | INTRAOCULAR | Status: DC | PRN
  Administered 2017-04-17: 1 mL via INTRAOCULAR

## 2017-04-17 MED ORDER — ACETAMINOPHEN 160 MG/5ML PO SOLN: 325.0000 mg | ORAL | Status: DC | PRN

## 2017-04-17 SURGICAL SUPPLY — 18 items
CANNULA ANT/CHMB 27GA (MISCELLANEOUS) ×3 IMPLANT
GLOVE SURG LX 7.5 STRW (GLOVE) ×2
GLOVE SURG LX STRL 7.5 STRW (GLOVE) ×1 IMPLANT
GLOVE SURG TRIUMPH 8.0 PF LTX (GLOVE) ×3 IMPLANT
GOWN STRL REUS W/ TWL LRG LVL3 (GOWN DISPOSABLE) ×2 IMPLANT
GOWN STRL REUS W/TWL LRG LVL3 (GOWN DISPOSABLE) ×4
LENS IOL TECNIS ITEC 22.5 (Intraocular Lens) ×3 IMPLANT
MARKER SKIN DUAL TIP RULER LAB (MISCELLANEOUS) ×3 IMPLANT
NEEDLE FILTER BLUNT 18X 1/2SAF (NEEDLE) ×2
NEEDLE FILTER BLUNT 18X1 1/2 (NEEDLE) ×1 IMPLANT
PACK CATARACT BRASINGTON (MISCELLANEOUS) ×3 IMPLANT
PACK EYE AFTER SURG (MISCELLANEOUS) ×3 IMPLANT
PACK OPTHALMIC (MISCELLANEOUS) ×3 IMPLANT
SYR 3ML LL SCALE MARK (SYRINGE) ×3 IMPLANT
SYR 5ML LL (SYRINGE) ×3 IMPLANT
SYR TB 1ML LUER SLIP (SYRINGE) ×3 IMPLANT
WATER STERILE IRR 250ML POUR (IV SOLUTION) ×3 IMPLANT
WIPE NON LINTING 3.25X3.25 (MISCELLANEOUS) ×3 IMPLANT

## 2017-04-17 NOTE — Transfer of Care (Signed)
Immediate Anesthesia Transfer of Care Note  Patient: Jonathan Greer  Procedure(s) Performed: CATARACT EXTRACTION PHACO AND INTRAOCULAR LENS PLACEMENT (IOC) (Right )  Patient Location: PACU  Anesthesia Type: MAC  Level of Consciousness: awake, alert  and patient cooperative  Airway and Oxygen Therapy: Patient Spontanous Breathing and Patient connected to supplemental oxygen  Post-op Assessment: Post-op Vital signs reviewed, Patient's Cardiovascular Status Stable, Respiratory Function Stable, Patent Airway and No signs of Nausea or vomiting  Post-op Vital Signs: Reviewed and stable  Complications: No apparent anesthesia complications

## 2017-04-17 NOTE — Op Note (Signed)
LOCATION:  [place]   PREOPERATIVE DIAGNOSIS:    Nuclear sclerotic cataract right eye. H25.11   POSTOPERATIVE DIAGNOSIS:  Nuclear sclerotic cataract right eye.     PROCEDURE:  Phacoemusification with posterior chamber intraocular lens placement of the right eye   LENS:   Implant Name Type Inv. Item Serial No. Manufacturer Lot No. LRB No. Used  LENS IOL DIOP 22.5 - [id number] Intraocular Lens LENS IOL DIOP 22.5 [id number] AMO   Right 1        ULTRASOUND TIME: 16 % of 1 minutes, 4 seconds.  CDE 10.1   SURGEON:  Wyonia Hough, MD   ANESTHESIA:  Topical with tetracaine drops and 2% Xylocaine jelly, augmented with 1% preservative-free intracameral lidocaine.    COMPLICATIONS:  None.   DESCRIPTION OF PROCEDURE:  The patient was identified in the holding room and transported to the operating room and placed in the supine position under the operating microscope.  The right eye was identified as the operative eye and it was prepped and draped in the usual sterile ophthalmic fashion.   A 1 millimeter clear-corneal paracentesis was made at the 12:00 position.  0.5 ml of preservative-free 1% lidocaine was injected into the anterior chamber. The anterior chamber was filled with Viscoat viscoelastic.  A 2.4 millimeter keratome was used to make a near-clear corneal incision at the 9:00 position.  A curvilinear capsulorrhexis was made with a cystotome and capsulorrhexis forceps.  Balanced salt solution was used to hydrodissect and hydrodelineate the nucleus.   Phacoemulsification was then used in stop and chop fashion to remove the lens nucleus and epinucleus.  The remaining cortex was then removed using the irrigation and aspiration handpiece. Provisc was then placed into the capsular bag to distend it for lens placement.  A lens was then injected into the capsular bag.  The remaining viscoelastic was aspirated.   Wounds were hydrated with balanced salt solution.  The anterior  chamber was inflated to a physiologic pressure with balanced salt solution.  No wound leaks were noted. Cefuroxime 0.1 ml of a 10mg /ml solution was injected into the anterior chamber for a dose of 1 mg of intracameral antibiotic at the completion of the case.   Timolol and Brimonidine drops were applied to the eye.  The patient was taken to the recovery room in stable condition without complications of anesthesia or surgery.   Tacuma Graffam 04/17/2017, 9:12 AM

## 2017-04-17 NOTE — Anesthesia Procedure Notes (Signed)
Procedure Name: MAC Performed by: Malvern Kadlec Pre-anesthesia Checklist: Patient identified, Emergency Drugs available, Suction available, Timeout performed and Patient being monitored Patient Re-evaluated:Patient Re-evaluated prior to inductionOxygen Delivery Method: Nasal cannula Placement Confirmation: positive ETCO2     

## 2017-04-17 NOTE — H&P (Signed)
The History and Physical notes are on paper, have been signed, and are to be scanned. The patient remains stable and unchanged from the H&P.   Previous H&P reviewed, patient examined, and there are no changes.  Jonathan Greer 04/17/2017 8:20 AM

## 2017-04-17 NOTE — Anesthesia Preprocedure Evaluation (Addendum)
Anesthesia Evaluation  Patient identified by MRN, date of birth, ID band Patient awake    Reviewed: Allergy & Precautions, NPO status   History of Anesthesia Complications Negative for: history of anesthetic complications  Airway Mallampati: II  TM Distance: >3 FB Neck ROM: full    Dental  (+) Upper Dentures, Lower Dentures   Pulmonary neg pulmonary ROS, former smoker,    Pulmonary exam normal        Cardiovascular Exercise Tolerance: Good (-) anginaNormal cardiovascular exam  Hyperlipidemia    Neuro/Psych negative neurological ROS  negative psych ROS   GI/Hepatic negative GI ROS, Neg liver ROS,   Endo/Other  negative endocrine ROSMorbid obesity (bmi=35)  Renal/GU negative Renal ROS  negative genitourinary   Musculoskeletal   Abdominal   Peds  Hematology Basal cell carcinoma   Anesthesia Other Findings   Reproductive/Obstetrics                            Anesthesia Physical  Anesthesia Plan  ASA: II  Anesthesia Plan: MAC   Post-op Pain Management:    Induction:   PONV Risk Score and Plan:   Airway Management Planned: Nasal Cannula  Additional Equipment:   Intra-op Plan:   Post-operative Plan:   Informed Consent: I have reviewed the patients History and Physical, chart, labs and discussed the procedure including the risks, benefits and alternatives for the proposed anesthesia with the patient or authorized representative who has indicated his/her understanding and acceptance.     Plan Discussed with: CRNA  Anesthesia Plan Comments:         Anesthesia Quick Evaluation

## 2017-04-17 NOTE — Anesthesia Postprocedure Evaluation (Signed)
Anesthesia Post Note  Patient: Jonathan Greer  Procedure(s) Performed: CATARACT EXTRACTION PHACO AND INTRAOCULAR LENS PLACEMENT (IOC) (Right )  Patient location during evaluation: PACU Anesthesia Type: MAC Level of consciousness: awake Pain management: pain level controlled Vital Signs Assessment: post-procedure vital signs reviewed and stable Respiratory status: respiratory function stable Cardiovascular status: stable Postop Assessment: no signs of nausea or vomiting Anesthetic complications: no    Veda Canning

## 2017-04-18 ENCOUNTER — Encounter: Payer: Self-pay | Admitting: Ophthalmology

## 2017-04-20 ENCOUNTER — Ambulatory Visit: Payer: Self-pay

## 2017-06-24 ENCOUNTER — Emergency Department
Admission: EM | Admit: 2017-06-24 | Discharge: 2017-06-24 | Disposition: A | Discharge status: Home / Self Care | Payer: Medicare Other | Attending: Emergency Medicine | Admitting: Emergency Medicine

## 2017-06-24 ENCOUNTER — Other Ambulatory Visit: Payer: Self-pay

## 2017-06-24 ENCOUNTER — Emergency Department: Payer: Medicare Other

## 2017-06-24 DIAGNOSIS — S99912A Unspecified injury of left ankle, initial encounter: Secondary | ICD-10-CM | POA: Diagnosis present

## 2017-06-24 DIAGNOSIS — Y999 Unspecified external cause status: Secondary | ICD-10-CM | POA: Insufficient documentation

## 2017-06-24 DIAGNOSIS — S82392A Other fracture of lower end of left tibia, initial encounter for closed fracture: Secondary | ICD-10-CM

## 2017-06-24 DIAGNOSIS — Z87891 Personal history of nicotine dependence: Secondary | ICD-10-CM | POA: Diagnosis not present

## 2017-06-24 DIAGNOSIS — Y929 Unspecified place or not applicable: Secondary | ICD-10-CM | POA: Diagnosis not present

## 2017-06-24 DIAGNOSIS — W11XXXA Fall on and from ladder, initial encounter: Secondary | ICD-10-CM | POA: Diagnosis not present

## 2017-06-24 DIAGNOSIS — Y939 Activity, unspecified: Secondary | ICD-10-CM | POA: Diagnosis not present

## 2017-06-24 DIAGNOSIS — S8252XA Displaced fracture of medial malleolus of left tibia, initial encounter for closed fracture: Secondary | ICD-10-CM | POA: Diagnosis not present

## 2017-06-24 DIAGNOSIS — S8251XA Displaced fracture of medial malleolus of right tibia, initial encounter for closed fracture: Secondary | ICD-10-CM | POA: Diagnosis not present

## 2017-06-24 MED ORDER — OXYCODONE-ACETAMINOPHEN 5-325 MG PO TABS: 1.0000 | ORAL_TABLET | Freq: Three times a day (TID) | ORAL | 0 refills | Status: DC | PRN

## 2017-06-24 NOTE — ED Provider Notes (Signed)
[facility] Emergency Department Provider Note       Time seen: ----------------------------------------- 2:15 PM on 06/24/2017 -----------------------------------------   I have reviewed the triage vital signs and the nursing notes.  HISTORY   Chief Complaint Fall and Ankle Pain    HPI Jonathan Greer is a 72 y.o. male with a history of basal cell carcinoma, hyperlipidemia who presents to the ED for a fall.  Patient states he was up on a ladder 8 feet and he fell landing most of his weight on his left ankle.  Patient had swelling noted to the left ankle.  He denies any neck or back pain.  He denies pain or proximally in the left leg.  There is mild.  He does not want anything for pain at this time.  Past Medical History:  Diagnosis Date  . Basal cell carcinoma    nose  . Hyperlipemia 07/07/2015  . Hyperlipidemia   . Shingles 07/07/2015  . Wears dentures     Patient Active Problem List   Diagnosis Date Noted  . Diverticulosis of large intestine without diverticulitis   . First degree hemorrhoids   . Benign neoplasm of transverse colon   . Hx of colonic polyps   . Benign neoplasm of ascending colon   . History of basal cell carcinoma of skin 01/17/2016  . 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 Hyperlipemia 07/07/2015  . 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 Kidney stone 07/07/2015  . Polyp of sigmoid colon 07/07/2015    Past Surgical History:  Procedure Laterality Date  . CATARACT EXTRACTION W/PHACO Left 03/13/2017   Procedure: CATARACT EXTRACTION PHACO AND INTRAOCULAR LENS PLACEMENT ([place]) LEFT;  Surgeon: Leandrew Koyanagi, MD;  Location: [place];  Service: Ophthalmology;  Laterality: Left;  IVA TOPICAL LEFT  . CATARACT EXTRACTION W/PHACO Right 04/17/2017   Procedure: CATARACT EXTRACTION PHACO AND INTRAOCULAR LENS PLACEMENT (IOC);  Surgeon: Leandrew Koyanagi, MD;  Location: [place];  Service: Ophthalmology;   Laterality: Right;  IVA TOPICAL RIGHT  . COLONOSCOPY WITH PROPOFOL N/A 07/27/2016   Procedure: COLONOSCOPY WITH PROPOFOL;  Surgeon: [name], MD;  Location: [facility];  Service: Endoscopy;  Laterality: N/A;  . HERNIA REPAIR    . MOHS SURGERY  01/10/2016   [facility] for [facility]  . TONSILLECTOMY AND ADENOIDECTOMY  1960's    Allergies Patient has no known allergies.  Social History Social History   Tobacco Use  . Smoking status: Former Smoker    Packs/day: 2.00    Years: 20.00    Pack years: 40.00    Types: Cigarettes    Last attempt to quit: 06/18/1974    Years since quitting: 43.0  . Smokeless tobacco: Never Used  . Tobacco comment: Quit in the 70s  Substance Use Topics  . Alcohol use: No    Alcohol/week: 0.0 oz  . Drug use: No    Review of Systems Constitutional: Negative for fever. Cardiovascular: Negative for chest pain. Respiratory: Negative for shortness of breath. Musculoskeletal: Positive for left ankle pain Skin: Negative for rash. Neurological: Negative for headaches, focal weakness or numbness.  All systems negative/normal/unremarkable except as stated in the HPI  ____________________________________________   PHYSICAL EXAM:  VITAL SIGNS: ED Triage Vitals  Enc Vitals Group     BP 06/24/17 1413 (!) 150/97     Pulse Rate 06/24/17 1413 66     Resp 06/24/17 1413 16     Temp 06/24/17 1413 97.6 F (36.4 C)  Temp Source 06/24/17 1413 Oral     SpO2 06/24/17 1413 97 %     Weight 06/24/17 1414 235 lb (106.6 kg)     Height 06/24/17 1414 5\' 8"  (1.727 m)     Head Circumference --      Peak Flow --      Pain Score 06/24/17 1413 6     Pain Loc --      Pain Edu? --      Excl. in [place]? --     Constitutional: Alert and oriented. Well appearing and in no distress. Cardiovascular: Normal rate, regular rhythm. No murmurs, rubs, or gallops. Respiratory: Normal respiratory effort without tachypnea nor retractions. Breath sounds are clear and equal bilaterally. No  wheezes/rales/rhonchi. Musculoskeletal: Pain with range of motion of left ankle, most of the tenderness is located over the ankle laterally.  There is tenderness over the talus as well.  Excellent pulses in the left foot and ankle Neurologic:  Normal speech and language. No gross focal neurologic deficits are appreciated.  Skin:  Skin is warm, dry and intact. No rash noted. ____________________________________________  ED COURSE:  As part of my medical decision making, I reviewed the following data within the [place] History obtained from family if available, nursing notes, old chart and ekg, as well as notes from prior ED visits. Patient presented for left ankle pain after a fall, we will assess with labs and imaging as indicated at this time. Clinical Course as of Jun 24 1448  Mon Jun 24, 2017  1442 X-rays reveal posterior distal tibia fracture  [JW]    Clinical Course User Index [JW] Earleen Newport, MD   Procedures ____________________________________________   RADIOLOGY Images were viewed by me  Left ankle x-rays IMPRESSION: There is an acute fracture involving the posterior malleolus which reaches the articular surface of the distal tibia. It exhibits an approximately 1 mm degree of depression of the articular surface. There is diffuse soft tissue swelling. ____________________________________________  DIFFERENTIAL DIAGNOSIS   Fracture, contusion, abrasion, dislocation  FINAL ASSESSMENT AND PLAN  Left ankle injury  Plan: Patient had presented for left ankle injury.  Patient's imaging reveals posterior malleolus fracture.  He will be placed in a posterior short leg splint and he has made nonweightbearing with crutches.  He will be referred to orthopedics for outpatient follow-up.   Earleen Newport, MD   Note: This note was generated in part or whole with voice recognition software. Voice recognition is usually quite accurate but there  are transcription errors that can and very often do occur. I apologize for any typographical errors that were not detected and corrected.     Earleen Newport, MD 06/24/17 1451

## 2017-06-24 NOTE — ED Triage Notes (Signed)
Pt states he was 65ft up on a ladder and fell landing with most of his wt on the left foot. Noted swelling of the left ankle. Denies any other back or neck pain.

## 2017-06-25 DIAGNOSIS — S82842A Displaced bimalleolar fracture of left lower leg, initial encounter for closed fracture: Secondary | ICD-10-CM | POA: Diagnosis not present

## 2017-06-25 DIAGNOSIS — S82842D Displaced bimalleolar fracture of left lower leg, subsequent encounter for closed fracture with routine healing: Secondary | ICD-10-CM | POA: Diagnosis not present

## 2017-06-28 ENCOUNTER — Emergency Department: Payer: Medicare Other

## 2017-06-28 ENCOUNTER — Other Ambulatory Visit: Payer: Self-pay

## 2017-06-28 ENCOUNTER — Emergency Department
Admission: EM | Admit: 2017-06-28 | Discharge: 2017-06-28 | Disposition: A | Discharge status: Home / Self Care | Payer: Medicare Other | Attending: Emergency Medicine | Admitting: Emergency Medicine

## 2017-06-28 ENCOUNTER — Encounter: Payer: Self-pay | Admitting: Emergency Medicine

## 2017-06-28 DIAGNOSIS — R109 Unspecified abdominal pain: Secondary | ICD-10-CM | POA: Diagnosis not present

## 2017-06-28 DIAGNOSIS — N201 Calculus of ureter: Secondary | ICD-10-CM | POA: Diagnosis not present

## 2017-06-28 DIAGNOSIS — Z87891 Personal history of nicotine dependence: Secondary | ICD-10-CM | POA: Diagnosis not present

## 2017-06-28 DIAGNOSIS — Z79899 Other long term (current) drug therapy: Secondary | ICD-10-CM | POA: Diagnosis not present

## 2017-06-28 DIAGNOSIS — R1011 Right upper quadrant pain: Secondary | ICD-10-CM | POA: Diagnosis present

## 2017-06-28 LAB — BASIC METABOLIC PANEL
Anion gap: 11 (ref 5–15)
BUN: 27 mg/dL — AB (ref 6–20)
CHLORIDE: 103 mmol/L (ref 101–111)
CO2: 24 mmol/L (ref 22–32)
CREATININE: 1.59 mg/dL — AB (ref 0.61–1.24)
Calcium: 9.7 mg/dL (ref 8.9–10.3)
GFR calc Af Amer: 49 mL/min — ABNORMAL LOW (ref >60)
GFR calc non Af Amer: 42 mL/min — ABNORMAL LOW (ref >60)
Glucose, Bld: 111 mg/dL — ABNORMAL HIGH (ref 65–99)
Potassium: 4.5 mmol/L (ref 3.5–5.1)
SODIUM: 138 mmol/L (ref 135–145)

## 2017-06-28 LAB — CBC
HCT: 47.3 % (ref 40.0–52.0)
HEMOGLOBIN: 16 g/dL (ref 13.0–18.0)
MCH: 30.9 pg (ref 26.0–34.0)
MCHC: 33.7 g/dL (ref 32.0–36.0)
MCV: 91.7 fL (ref 80.0–100.0)
Platelets: 210 10*3/uL (ref 150–440)
RBC: 5.16 MIL/uL (ref 4.40–5.90)
RDW: 13.9 % (ref 11.5–14.5)
WBC: 9.1 10*3/uL (ref 3.8–10.6)

## 2017-06-28 LAB — URINALYSIS, COMPLETE (UACMP) WITH MICROSCOPIC
BACTERIA UA: NONE SEEN
Bilirubin Urine: NEGATIVE
GLUCOSE, UA: NEGATIVE mg/dL
KETONES UR: NEGATIVE mg/dL
LEUKOCYTES UA: NEGATIVE
NITRITE: NEGATIVE
PH: 5.5 (ref 5.0–8.0)
Protein, ur: NEGATIVE mg/dL
Specific Gravity, Urine: >1.03 — ABNORMAL HIGH (ref 1.005–1.030)

## 2017-06-28 MED ORDER — TAMSULOSIN HCL 0.4 MG PO CAPS: 0.4000 mg | ORAL_CAPSULE | Freq: Every day | ORAL | 0 refills | Status: DC

## 2017-06-28 MED ORDER — ONDANSETRON 4 MG PO TBDP: 4.0000 mg | ORAL_TABLET | Freq: Three times a day (TID) | ORAL | 0 refills | Status: DC | PRN

## 2017-06-28 MED ORDER — SODIUM CHLORIDE 0.9 % IV SOLN
1000.0000 mL | Freq: Once | INTRAVENOUS | Status: AC
  Administered 2017-06-28: 1000 mL via INTRAVENOUS

## 2017-06-28 NOTE — ED Provider Notes (Signed)
[facility] Emergency Department Provider Note   ____________________________________________    I have reviewed the triage vital signs and the nursing notes.   HISTORY  Chief Complaint Flank Pain     HPI Jonathan Greer is a 72 y.o. male who presents with complaints of right flank pain.  Patient reports flank pain developed last night and has been intermittent since then.  He reports it as sharp and severe.  He treated with ibuprofen and this helped significantly.  Currently feels quite well.  He does report dark urine, is unclear if there is blood in it.  Does have a history of kidney stones.  No fevers or chills or dysuria.  No nausea or vomiting.    Past Medical History:  Diagnosis Date  . Basal cell carcinoma    nose  . Hyperlipemia 07/07/2015  . Hyperlipidemia   . Shingles 07/07/2015  . Wears dentures     Patient Active Problem List   Diagnosis Date Noted  . Diverticulosis of large intestine without diverticulitis   . First degree hemorrhoids   . Benign neoplasm of transverse colon   . Hx of colonic polyps   . Benign neoplasm of ascending colon   . History of basal cell carcinoma of skin 01/17/2016  . 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 Hyperlipemia 07/07/2015  . 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 Kidney stone 07/07/2015  . Polyp of sigmoid colon 07/07/2015    Past Surgical History:  Procedure Laterality Date  . CATARACT EXTRACTION W/PHACO Left 03/13/2017   Procedure: CATARACT EXTRACTION PHACO AND INTRAOCULAR LENS PLACEMENT ([place]) LEFT;  Surgeon: Leandrew Koyanagi, MD;  Location: [employer];  Service: Ophthalmology;  Laterality: Left;  IVA TOPICAL LEFT  . CATARACT EXTRACTION W/PHACO Right 04/17/2017   Procedure: CATARACT EXTRACTION PHACO AND INTRAOCULAR LENS PLACEMENT (IOC);  Surgeon: Leandrew Koyanagi, MD;  Location: [place];  Service: Ophthalmology;  Laterality: Right;  IVA TOPICAL RIGHT    . COLONOSCOPY WITH PROPOFOL N/A 07/27/2016   Procedure: COLONOSCOPY WITH PROPOFOL;  Surgeon: [name], MD;  Location: [facility];  Service: Endoscopy;  Laterality: N/A;  . HERNIA REPAIR    . MOHS SURGERY  01/10/2016   [facility]  . TONSILLECTOMY AND ADENOIDECTOMY  1960's    Prior to Admission medications   Medication Sig Start Date End Date Taking? Authorizing Provider  ondansetron (ZOFRAN ODT) 4 MG disintegrating tablet Take 1 tablet (4 mg total) by mouth every 8 (eight) hours as needed for nausea or vomiting. 06/28/17   Lavonia Drafts, MD  oxyCODONE-acetaminophen (PERCOCET) 5-325 MG tablet Take 1-2 tablets by mouth every 8 (eight) hours as needed. 06/24/17   Earleen Newport, MD  tamsulosin (FLOMAX) 0.4 MG CAPS capsule Take 1 capsule (0.4 mg total) by mouth daily. 06/28/17   Lavonia Drafts, MD     Allergies Patient has no known allergies.  Family History  Problem Relation Age of Onset  . Heart failure Mother   . CVA Father   . Lung cancer Brother   . Liver cancer Maternal Grandmother   . Cancer Paternal Grandmother   . Cancer Paternal Grandfather     Social History Social History   Tobacco Use  . Smoking status: Former Smoker    Packs/day: 2.00    Years: 20.00    Pack years: 40.00    Types: Cigarettes    Last attempt to quit: 06/18/1974    Years since quitting: 43.0  . Smokeless tobacco: Never  Used  . Tobacco comment: Quit in the 70s  Substance Use Topics  . Alcohol use: No    Alcohol/week: 0.0 oz  . Drug use: No    Review of Systems  Constitutional: No fever/chills Eyes: No visual changes.  ENT: No sore throat. Cardiovascular: Denies chest pain. Respiratory: Denies shortness of breath. Gastrointestinal: As above.  No nausea, no vomiting.   Genitourinary: No dysuria, positive dark urine Musculoskeletal: Negative for back pain. Skin: Negative for rash. Neurological: Negative for headaches   ____________________________________________   PHYSICAL  EXAM:  VITAL SIGNS: ED Triage Vitals [06/28/17 1003]  Enc Vitals Group     BP (!) 141/88     Pulse Rate 65     Resp 20     Temp 97.6 F (36.4 C)     Temp Source Oral     SpO2 97 %     Weight 106.6 kg (235 lb)     Height 1.727 m (5\' 8" )     Head Circumference      Peak Flow      Pain Score 5     Pain Loc      Pain Edu?      Excl. in [place]?     Constitutional: Alert and oriented. No acute distress. Pleasant and interactive Eyes: Conjunctivae are normal.   Nose: No congestion/rhinnorhea. Mouth/Throat: Mucous membranes are moist.    Cardiovascular: Normal rate, regular rhythm. Grossly normal heart sounds.  Good peripheral circulation. Respiratory: Normal respiratory effort.  No retractions. Lungs CTAB. Gastrointestinal: Soft and nontender. No distention.  No CVA tenderness. Genitourinary: deferred Musculoskeletal:  Warm and well perfused.  Aircast on left foot Neurologic:  Normal speech and language. No gross focal neurologic deficits are appreciated.  Skin:  Skin is warm, dry and intact. No rash noted. Psychiatric: Mood and affect are normal. Speech and behavior are normal.  ____________________________________________   LABS (all labs ordered are listed, but only abnormal results are displayed)  Labs Reviewed  URINALYSIS, COMPLETE (UACMP) WITH MICROSCOPIC - Abnormal; Notable for the following components:      Result Value   Specific Gravity, Urine >1.030 (*)    Hgb urine dipstick MODERATE (*)    Squamous Epithelial / LPF 0-5 (*)    All other components within normal limits  BASIC METABOLIC PANEL - Abnormal; Notable for the following components:   Glucose, Bld 111 (*)    BUN 27 (*)    Creatinine, Ser 1.59 (*)    GFR calc non Af Amer 42 (*)    GFR calc Af Amer 49 (*)    All other components within normal limits  CBC   ____________________________________________  EKG   ____________________________________________  RADIOLOGY  CT scan demonstrates 3 mm  UVJ ____________________________________________   PROCEDURES  Procedure(s) performed: No  Procedures   Critical Care performed: No ____________________________________________   INITIAL IMPRESSION / ASSESSMENT AND PLAN / ED COURSE  Pertinent labs & imaging results that were available during my care of the patient were reviewed by me and considered in my medical decision making (see chart for details).  Patient presents with right-sided flank pain with "dark urine".  History of kidney stones this feels somewhat similar although he was surprised that was relieved by ibuprofen.  CT scan does demonstrate 3 mm stone, no evidence of infection.  Will continue to treat with NSAIDs, Flomax, Zofran.  Outpatient follow-up as needed.    ____________________________________________   FINAL CLINICAL IMPRESSION(S) / ED DIAGNOSES  Final diagnoses:  Ureterolithiasis  Note:  This document was prepared using Dragon voice recognition software and may include unintentional dictation errors.    Lavonia Drafts, MD 06/28/17 [phone]

## 2017-06-28 NOTE — ED Triage Notes (Signed)
Pt reports right flank pain since last night at 5pm, states he has noticed his urine is dark in color. Pt states he was on oxycodone s/p fall Monday but took last dose Wednesday, states he has hx of kidney stone in the past.   Pt states that he does have mild discomfort when urinating.

## 2017-07-09 DIAGNOSIS — S82842A Displaced bimalleolar fracture of left lower leg, initial encounter for closed fracture: Secondary | ICD-10-CM | POA: Diagnosis not present

## 2017-07-19 ENCOUNTER — Ambulatory Visit (INDEPENDENT_AMBULATORY_CARE_PROVIDER_SITE_OTHER): Payer: Medicare Other | Admitting: Family Medicine

## 2017-07-19 ENCOUNTER — Ambulatory Visit (INDEPENDENT_AMBULATORY_CARE_PROVIDER_SITE_OTHER): Payer: Medicare Other

## 2017-07-19 VITALS — BP 136/80 | HR 68 | Temp 97.7°F | Ht 70.0 in | Wt 252.6 lb

## 2017-07-19 DIAGNOSIS — Z0000 Encounter for general adult medical examination without abnormal findings: Secondary | ICD-10-CM | POA: Diagnosis not present

## 2017-07-19 DIAGNOSIS — E785 Hyperlipidemia, unspecified: Secondary | ICD-10-CM | POA: Diagnosis not present

## 2017-07-19 DIAGNOSIS — Z125 Encounter for screening for malignant neoplasm of prostate: Secondary | ICD-10-CM | POA: Diagnosis not present

## 2017-07-19 NOTE — Progress Notes (Signed)
Subjective:   Jonathan Greer is a 72 y.o. male who presents for Medicare Annual/Subsequent preventive examination.  Review of Systems:  N/A  Cardiac Risk Factors include: advanced age (>53men, >3 women);dyslipidemia     Objective:    Vitals: BP 136/80 (BP Location: Left Arm)   Pulse 68   Temp 97.7 F (36.5 C) (Oral)   Ht 5\' 10"  (1.778 m)   Wt 252 lb 9.6 oz (114.6 kg)   BMI 36.24 kg/m   Body mass index is 36.24 kg/m.  Advanced Directives 07/19/2017 06/28/2017 06/24/2017 04/17/2017 03/13/2017 01/15/2017 07/27/2016  Does Patient Have a Medical Advance Directive? No No No No No No No  Would patient like information on creating a medical advance directive? Yes (MAU/Ambulatory/Procedural Areas - Information given) No - Patient declined No - Patient declined No - Patient declined No - Patient declined No - Patient declined -    Tobacco Social History   Tobacco Use  Smoking Status Former Smoker  . Packs/day: 2.00  . Years: 20.00  . Pack years: 40.00  . Types: Cigarettes  . Last attempt to quit: 06/19/1975  . Years since quitting: 42.1  Smokeless Tobacco Former Systems developer  . Types: Chew  Tobacco Comment   Quit in the 3s     Counseling given: Not Answered Comment: Quit in the 70s   Clinical Intake:  Pre-visit preparation completed: Yes  Pain : No/denies pain Pain Score: 0-No pain     Nutritional Risks: None Diabetes: No  How often do you need to have someone help you when you read instructions, pamphlets, or other written materials from your doctor or pharmacy?: 1 - Never  Interpreter Needed?: No  Information entered by :: [facility], LPN  Past Medical History:  Diagnosis Date  . Basal cell carcinoma    nose  . Hyperlipemia 07/07/2015  . Hyperlipidemia   . Shingles 07/07/2015  . Wears dentures    Past Surgical History:  Procedure Laterality Date  . CATARACT EXTRACTION W/PHACO Left 03/13/2017   Procedure: CATARACT EXTRACTION PHACO AND INTRAOCULAR LENS PLACEMENT  ([place]) LEFT;  Surgeon: Leandrew Koyanagi, MD;  Location: [facility];  Service: Ophthalmology;  Laterality: Left;  IVA TOPICAL LEFT  . CATARACT EXTRACTION W/PHACO Right 04/17/2017   Procedure: CATARACT EXTRACTION PHACO AND INTRAOCULAR LENS PLACEMENT ([facility]);  Surgeon: Leandrew Koyanagi, MD;  Location: [facility];  Service: Ophthalmology;  Laterality: Right;  IVA TOPICAL RIGHT  . COLONOSCOPY WITH PROPOFOL N/A 07/27/2016   Procedure: COLONOSCOPY WITH PROPOFOL;  Surgeon: [name], MD;  Location: [facility] END[facility];  Service: Endoscopy;  Laterality: N/A;  . HERNIA REPAIR    . MOHS SURGERY  01/10/2016   [facility]  . TONSILLECTOMY AND ADENOIDECTOMY  1960's   Family History  Problem Relation Age of Onset  . Heart failure Mother   . CVA Father   . Lung cancer Brother   . Liver cancer Maternal Grandmother   . Cancer Paternal Grandmother   . Cancer Paternal Grandfather    Social History   Socioeconomic History  . Marital status: Married    Spouse name: None  . Number of children: 0  . Years of education: None  . Highest education level: 10th grade  Social Needs  . Financial resource strain: Not hard at all  . Food insecurity - worry: Never true  . Food insecurity - inability: Never true  . Transportation needs - medical: No  . Transportation needs - non-medical: No  Occupational History  . 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 Smoking status: Former Smoker    Packs/day: 2.00    Years: 20.00    Pack years: 40.00    Types: Cigarettes    Last attempt to quit: 06/19/1975    Years since quitting: 42.1  . Smokeless tobacco: Former Systems developer    Types: Chew  . Tobacco comment: Quit in the 47s  Substance and Sexual Activity  . Alcohol use: No    Alcohol/week: 0.0 oz  . Drug use: No  . Sexual activity: None  Other Topics Concern  . None  Social History Narrative   Working 5 days a week in  [employer]. (06/2016)    Outpatient Encounter Medications as of 07/19/2017  Medication Sig  . [DISCONTINUED] ondansetron (ZOFRAN ODT) 4 MG disintegrating tablet Take 1 tablet (4 mg total) by mouth every 8 (eight) hours as needed for nausea or vomiting.  . [DISCONTINUED] oxyCODONE-acetaminophen (PERCOCET) 5-325 MG tablet Take 1-2 tablets by mouth every 8 (eight) hours as needed.  . [DISCONTINUED] tamsulosin (FLOMAX) 0.4 MG CAPS capsule Take 1 capsule (0.4 mg total) by mouth daily.   No facility-administered encounter medications on file as of 07/19/2017.     Activities of Daily Living In your present state of health, do you have any difficulty performing the following activities: 07/19/2017 04/17/2017  Hearing? N N  Vision? N N  Difficulty concentrating or making decisions? N N  Walking or climbing stairs? Y N  Comment Due to broken ankle. -  Dressing or bathing? N N  Doing errands, shopping? N -  Preparing Food and eating ? N -  Using the Toilet? N -  In the past six months, have you accidently leaked urine? N -  Do you have problems with loss of bowel control? N -  Managing your Medications? N -  Managing your Finances? N -  Housekeeping or managing your Housekeeping? N -  Some recent data might be hidden    Patient Care Team: Birdie Sons, MD as PCP - General (Family Medicine) [place], [place] as Consulting Physician (Optometry)   Assessment:   This is a routine wellness examination for Jonathan Greer.  Exercise Activities and Dietary recommendations Current Exercise Habits: The patient does not participate in regular exercise at present, Exercise limited by: orthopedic condition(s)(currently has a broken ankle)  Goals    None      Fall Risk Fall Risk  07/19/2017 06/27/2016  Falls in the past year? Yes No  Number falls in past yr: 1 -  Comment fell off later -  Injury with Fall? Yes -  Comment broken left ankle -  Follow up Falls prevention discussed -   Is the  patient's home free of loose throw rugs in walkways, pet beds, electrical cords, etc?   yes      Grab bars in the bathroom? no      Handrails on the stairs?   yes      Adequate lighting?   yes  Timed Get Up and Go Performed: N/A  Depression Screen PHQ 2/9 Scores 07/19/2017 07/19/2017 06/27/2016  PHQ - 2 Score 0 0 0  PHQ- 9 Score 0 - -    Cognitive Function: Pt declined screening today.        Immunization History  Administered Date(s) Administered  . Influenza, High Dose Seasonal PF 06/27/2016  . Pneumococcal Conjugate-13 10/07/2014  . Pneumococcal Polysaccharide-23  03/18/2013  . Tdap 06/30/2010, 01/15/2017    Qualifies for Shingles Vaccine? Pt declines today.   Screening Tests Health Maintenance  Topic Date Due  . COLONOSCOPY  07/27/2021  . TETANUS/TDAP  01/16/2027  . INFLUENZA VACCINE  Completed  . Hepatitis C Screening  Completed  . PNA vac Low Risk Adult  Completed   Cancer Screenings: Lung: Low Dose CT Chest recommended if Age 17-80 years, 30 pack-year currently smoking OR have quit w/in 15years. Patient does not qualify. Colorectal: Up to date  Additional Screenings:  Hepatitis B/HIV/Syphillis: Pt declines today.  Hepatitis C Screening: Up to date    Plan:  I have personally reviewed and addressed the Medicare Annual Wellness questionnaire and have noted the following in the patient's chart:  A. Medical and social history B. Use of alcohol, tobacco or illicit drugs  C. Current medications and supplements D. Functional ability and status E.  Nutritional status F.  Physical activity G. Advance directives H. List of other physicians I.  Hospitalizations, surgeries, and ER visits in previous 12 months J.  [place] such as hearing and vision if needed, cognitive and depression L. Referrals and appointments - none  In addition, I have reviewed and discussed with patient certain preventive protocols, quality metrics, and best practice recommendations.  A written personalized care plan for preventive services as well as general preventive health recommendations were provided to patient.  See attached scanned questionnaire for additional information.   Signed,  Fabio Neighbors, LPN Nurse Health Advisor   Nurse Recommendations: None.

## 2017-07-19 NOTE — Progress Notes (Signed)
Patient: Jonathan Greer, Male    DOB: 1946/06/07, 72 y.o.   MRN: [id number] Visit Date: 07/19/2017  Today's Provider: Lelon Huh, MD   Chief Complaint  Patient presents with  . Annual Wellness Exam  . Hyperlipidemia   Subjective:   Patient saw McKenzie today at 9:15 am for AWV.    Complete Physical Jonathan Greer is a 73 y.o. male. He feels well. He reports exercising not regularly. He reports he is sleeping well.  He reports he fell off a ladder at home and broke left foot last months being treated at [facility]. Had been working 4 days a week, but now that he has not been able to work has been gaining weight. He is starting low carb diet this week  Wt Readings from Last 3 Encounters:  07/19/17 252 lb 9.6 oz (114.6 kg)  06/28/17 235 lb (106.6 kg)  06/24/17 235 lb (106.6 kg)        Lipid/Cholesterol, Follow-up:   Last seen for this 1 years ago.  Management since that visit includes; labs checked, recommended taking an aspirin 81 mg qd.  Last Lipid Panel:    Component Value Date/Time   CHOL 224 (H) 06/28/2016 0809   TRIG 66 06/28/2016 0809   HDL 68 06/28/2016 0809   CHOLHDL 3.3 06/28/2016 0809   LDLCALC 143 (H) 06/28/2016 0809    He reports good compliance with treatment. He is not having side effects.   Wt Readings from Last 3 Encounters:  07/19/17 252 lb 9.6 oz (114.6 kg)  06/28/17 235 lb (106.6 kg)  06/24/17 235 lb (106.6 kg)    Skin lesion of face Continues regular follow up [facility] for history of BCC    Review of Systems  Constitutional: Negative.   HENT: Positive for congestion.   Eyes: Negative.   Respiratory: Negative.   Cardiovascular: Negative.   Gastrointestinal: Negative.   Endocrine: Negative.   Genitourinary: Negative.   Musculoskeletal: Positive for arthralgias and gait problem.       Patient has a boot on his left foot due to a fall.   Skin: Negative.   Allergic/Immunologic: Negative.     Hematological: Negative.   Psychiatric/Behavioral: Negative.     Social History   Socioeconomic History  . Marital status: Married    Spouse name: Not on file  . Number of children: 0  . Years of education: Not on file  . Highest education level: 10th grade  Social Needs  . Financial resource strain: Not hard at all  . Food insecurity - worry: Never true  . Food insecurity - inability: Never true  . Transportation needs - medical: No  . Transportation needs - non-medical: No  Occupational History  . 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 Smoking status: Former Smoker    Packs/day: 2.00    Years: 20.00    Pack years: 40.00    Types: Cigarettes    Last attempt to quit: 06/19/1975    Years since quitting: 42.1  . Smokeless tobacco: Former Systems developer    Types: Chew  . Tobacco comment: Quit in the 52s  Substance and Sexual Activity  . Alcohol use: No    Alcohol/week: 0.0 oz  . Drug use: No  . Sexual activity: Not on file  Other Topics Concern  . Not on file  Social History Narrative   Working  5 days a week in [employer]. (06/2016)    Past Medical History:  Diagnosis Date  . Basal cell carcinoma    nose  . Hyperlipemia 07/07/2015  . Hyperlipidemia   . Shingles 07/07/2015  . Wears dentures      Patient Active Problem List   Diagnosis Date Noted  . Diverticulosis of large intestine without diverticulitis   . First degree hemorrhoids   . Benign neoplasm of transverse colon   . Hx of colonic polyps   . Benign neoplasm of ascending colon   . History of basal cell carcinoma of skin 01/17/2016  . 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 Hyperlipemia 07/07/2015  . 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 Kidney stone 07/07/2015  . Polyp of sigmoid colon 07/07/2015    Past Surgical History:  Procedure Laterality Date  . CATARACT EXTRACTION W/PHACO Left 03/13/2017   Procedure: CATARACT EXTRACTION PHACO  AND INTRAOCULAR LENS PLACEMENT ([place]) LEFT;  Surgeon: Leandrew Koyanagi, MD;  Location: [facility];  Service: Ophthalmology;  Laterality: Left;  IVA TOPICAL LEFT  . CATARACT EXTRACTION W/PHACO Right 04/17/2017   Procedure: CATARACT EXTRACTION PHACO AND INTRAOCULAR LENS PLACEMENT ([facility]);  Surgeon: Leandrew Koyanagi, MD;  Location: [facility];  Service: Ophthalmology;  Laterality: Right;  IVA TOPICAL RIGHT  . COLONOSCOPY WITH PROPOFOL N/A 07/27/2016   Procedure: COLONOSCOPY WITH PROPOFOL;  Surgeon: [name], MD;  Location: [facility];  Service: Endoscopy;  Laterality: N/A;  . HERNIA REPAIR    . MOHS SURGERY  01/10/2016   [facility] for [facility]  . TONSILLECTOMY AND ADENOIDECTOMY  73's    His family history includes CVA in his father; Cancer in his paternal grandfather and paternal grandmother; Heart failure in his mother; Liver cancer in his maternal grandmother; Lung cancer in his brother.       Patient Care Team: Birdie Sons, MD as PCP - General (Family Medicine) [place], [place] as Consulting Physician (Optometry) [name], MD as Consulting Physician (Gastroenterology) Tamsen Meek, MD as Referring Physician (Dermatology)     Objective:   Vitals:  BP  136/80 (BP Location: Left Arm)     Pulse  68     Temp  97.7 F (36.5 C) (Oral)     Ht  5\' 10"  (1.778 m)     Wt  252 lb 9.6 oz (114.6 kg)      BMI  36.24 kg/m      Physical Exam   General Appearance:    Alert, cooperative, no distress, appears stated age  Head:    Normocephalic, without obvious abnormality, atraumatic  Eyes:    PERRL, conjunctiva/corneas clear, EOM's intact, fundi    benign, both eyes       Ears:    Normal TM's and external ear canals, both ears  Nose:   Nares normal, septum midline, mucosa normal, no drainage   or sinus tenderness  Throat:   Lips, mucosa, and tongue normal; teeth and gums normal  Neck:   Supple, symmetrical, trachea midline, no  adenopathy;       thyroid:  No enlargement/tenderness/nodules; no carotid   bruit or JVD  Back:     Symmetric, no curvature, ROM normal, no CVA tenderness  Lungs:     Clear to auscultation bilaterally, respirations unlabored  Chest wall:    No tenderness or deformity  Heart:    Regular rate and rhythm, S1 and S2 normal, no murmur, rub   or gallop  Abdomen:  Soft, non-tender, bowel sounds active all four quadrants,    no masses, no organomegaly  Genitalia:    deferred  Rectal:    deferred  Pulses:   2+ and symmetric all extremities  Skin:   Skin color, texture, turgor normal, no rashes or lesions  Lymph nodes:   Cervical, supraclavicular, and axillary nodes normal  Neurologic:   CNII-XII intact. Normal strength, sensation and reflexes      throughout    Activities of Daily Living In your present state of health, do you have any difficulty performing the following activities: 07/19/2017 04/17/2017  Hearing? N N  Vision? N N  Difficulty concentrating or making decisions? N N  Walking or climbing stairs? Y N  Comment Due to broken ankle. -  Dressing or bathing? N N  Doing errands, shopping? N -  Preparing Food and eating ? N -  Using the Toilet? N -  In the past six months, have you accidently leaked urine? N -  Do you have problems with loss of bowel control? N -  Managing your Medications? N -  Managing your Finances? N -  Housekeeping or managing your Housekeeping? N -  Some recent data might be hidden    Fall Risk Assessment Fall Risk  07/19/2017 06/27/2016  Falls in the past year? Yes No  Number falls in past yr: 1 -  Comment fell off later -  Injury with Fall? Yes -  Comment broken left ankle -  Follow up Falls prevention discussed -     Depression Screen PHQ 2/9 Scores 07/19/2017 07/19/2017 06/27/2016  PHQ - 2 Score 0 0 0  PHQ- 9 Score 0 - -    Cognitive Function: Pt declined screening today     Assessment & Plan:    Annual Physical Reviewed patient's Family  Medical History Reviewed and updated list of patient's medical providers Assessment of cognitive impairment was done Assessed patient's functional ability Established a written schedule for health screening [place] Completed and Reviewed  Exercise Activities and Dietary recommendations Goals    None      Immunization History  Administered Date(s) Administered  . Influenza, High Dose Seasonal PF 06/27/2016  . Pneumococcal Conjugate-13 10/07/2014  . Pneumococcal Polysaccharide-23 03/18/2013  . Tdap 06/30/2010, 01/15/2017    Health Maintenance  Topic Date Due  . COLONOSCOPY  07/27/2021  . TETANUS/TDAP  01/16/2027  . INFLUENZA VACCINE  Completed  . Hepatitis C Screening  Completed  . PNA vac Low Risk Adult  Completed     Discussed health benefits of physical activity, and encouraged him to engage in regular exercise appropriate for his age and condition.    ------------------------------------------------------------------------------------------------------------  1. Annual physical exam Generally doing well. Agree with low carb diet.  - Lipid panel - Comprehensive metabolic panel - PSA  2. Hyperlipidemia, unspecified hyperlipidemia type Diet controlled.  - Lipid panel - Comprehensive metabolic panel  3. Prostate cancer screening  - PSA   Lelon Huh, MD  [facility]

## 2017-07-19 NOTE — Patient Instructions (Addendum)
 The CDC recommends two doses of Shingrix (the shingles vaccine) separated by 2 to 6 months for adults age 72 years and older. I recommend checking with your insurance plan regarding coverage for this vaccine.    Preventive Care 65 Years and Older, Male Preventive care refers to lifestyle choices and visits with your health care provider that can promote health and wellness. What does preventive care include?  A yearly physical exam. This is also called an annual well check.  Dental exams once or twice a year.  Routine eye exams. Ask your health care provider how often you should have your eyes checked.  Personal lifestyle choices, including: ? Daily care of your teeth and gums. ? Regular physical activity. ? Eating a healthy diet. ? Avoiding tobacco and drug use. ? Limiting alcohol use. ? Practicing safe sex. ? Taking low doses of aspirin every day. ? Taking vitamin and mineral supplements as recommended by your health care provider. What happens during an annual well check? The services and screenings done by your health care provider during your annual well check will depend on your age, overall health, lifestyle risk factors, and family history of disease. Counseling Your health care provider may ask you questions about your:  Alcohol use.  Tobacco use.  Drug use.  Emotional well-being.  Home and relationship well-being.  Sexual activity.  Eating habits.  History of falls.  Memory and ability to understand (cognition).  Work and work environment.  Screening You may have the following tests or measurements:  Height, weight, and BMI.  Blood pressure.  Lipid and cholesterol levels. These may be checked every 5 years, or more frequently if you are over 72 years old.  Skin check.  Lung cancer screening. You may have this screening every year starting at age 55 if you have a 30-pack-year history of smoking and currently smoke or have quit within the past 15  years.  Fecal occult blood test (FOBT) of the stool. You may have this test every year starting at age 72.  Flexible sigmoidoscopy or colonoscopy. You may have a sigmoidoscopy every 5 years or a colonoscopy every 10 years starting at age 72.  Prostate cancer screening. Recommendations will vary depending on your family history and other risks.  Hepatitis C blood test.  Hepatitis B blood test.  Sexually transmitted disease (STD) testing.  Diabetes screening. This is done by checking your blood sugar (glucose) after you have not eaten for a while (fasting). You may have this done every 1-3 years.  Abdominal aortic aneurysm (AAA) screening. You may need this if you are a current or former smoker.  Osteoporosis. You may be screened starting at age 70 if you are at high risk.  Talk with your health care provider about your test results, treatment options, and if necessary, the need for more tests. Vaccines Your health care provider may recommend certain vaccines, such as:  Influenza vaccine. This is recommended every year.  Tetanus, diphtheria, and acellular pertussis (Tdap, Td) vaccine. You may need a Td booster every 10 years.  Varicella vaccine. You may need this if you have not been vaccinated.  Zoster vaccine. You may need this after age 60.  Measles, mumps, and rubella (MMR) vaccine. You may need at least one dose of MMR if you were born in 1957 or later. You may also need a second dose.  Pneumococcal 13-valent conjugate (PCV13) vaccine. One dose is recommended after age 65.  Pneumococcal polysaccharide (PPSV23) vaccine. One dose is   is recommended after age 27.  Meningococcal vaccine. You may need this if you have certain conditions.  Hepatitis A vaccine. You may need this if you have certain conditions or if you travel or work in places where you may be exposed to hepatitis A.  Hepatitis B vaccine. You may need this if you have certain conditions or if you travel or work in  places where you may be exposed to hepatitis B.  Haemophilus influenzae type b (Hib) vaccine. You may need this if you have certain risk factors.  Talk to your health care provider about which screenings and vaccines you need and how often you need them. This information is not intended to replace advice given to you by your health care provider. Make sure you discuss any questions you have with your health care provider. Document Released: 07/01/2015 Document Revised: 02/22/2016 Document Reviewed: 04/05/2015 Elsevier Interactive Patient Education  [employer].

## 2017-07-19 NOTE — Patient Instructions (Signed)
[name] , Thank you for taking time to come for your Medicare Wellness Visit. I appreciate your ongoing commitment to your health goals. Please review the following plan we discussed and let me know if I can assist you in the future.   Screening recommendations/referrals: Colonoscopy: Up to date Recommended yearly ophthalmology/optometry visit for glaucoma screening and checkup Recommended yearly dental visit for hygiene and checkup  Vaccinations: Influenza vaccine: Up to date Pneumococcal vaccine: Up to date Tdap vaccine: Up to date Shingles vaccine: Pt declines today.     Advanced directives: Advance directive discussed with you today. I have provided a copy for you to complete at home and have notarized. Once this is complete please bring a copy in to our office so we can scan it into your chart.  Conditions/risks identified: Fall risk prevention; Obesity- Recommend to continue with current diet plan of cutting out all foods that are white (focusing on carbs and sugars).   Next appointment: 10:00 AM today  Preventive Care 65 Years and Older, Male Preventive care refers to lifestyle choices and visits with your health care provider that can promote health and wellness. What does preventive care include?  A yearly physical exam. This is also called an annual well check.  Dental exams once or twice a year.  Routine eye exams. Ask your health care provider how often you should have your eyes checked.  Personal lifestyle choices, including:  Daily care of your teeth and gums.  Regular physical activity.  Eating a healthy diet.  Avoiding tobacco and drug use.  Limiting alcohol use.  Practicing safe sex.  Taking low doses of aspirin every day.  Taking vitamin and mineral supplements as recommended by your health care provider. What happens during an annual well check? The services and screenings done by your health care provider during your annual well check will  depend on your age, overall health, lifestyle risk factors, and family history of disease. Counseling  Your health care provider may ask you questions about your:  Alcohol use.  Tobacco use.  Drug use.  Emotional well-being.  Home and relationship well-being.  Sexual activity.  Eating habits.  History of falls.  Memory and ability to understand (cognition).  Work and work Statistician. Screening  You may have the following tests or measurements:  Height, weight, and BMI.  Blood pressure.  Lipid and cholesterol levels. These may be checked every 5 years, or more frequently if you are over 67 years old.  Skin check.  Lung cancer screening. You may have this screening every year starting at age 4 if you have a 30-pack-year history of smoking and currently smoke or have quit within the past 15 years.  Fecal occult blood test (FOBT) of the stool. You may have this test every year starting at age 68.  Flexible sigmoidoscopy or colonoscopy. You may have a sigmoidoscopy every 5 years or a colonoscopy every 10 years starting at age 3.  Prostate cancer screening. Recommendations will vary depending on your family history and other risks.  Hepatitis C blood test.  Hepatitis B blood test.  Sexually transmitted disease (STD) testing.  Diabetes screening. This is done by checking your blood sugar (glucose) after you have not eaten for a while (fasting). You may have this done every 1-3 years.  Abdominal aortic aneurysm (AAA) screening. You may need this if you are a current or former smoker.  Osteoporosis. You may be screened starting at age 58 if you are at high risk.  Talk with your health care provider about your test results, treatment options, and if necessary, the need for more tests. Vaccines  Your health care provider may recommend certain vaccines, such as:  Influenza vaccine. This is recommended every year.  Tetanus, diphtheria, and acellular pertussis (Tdap,  Td) vaccine. You may need a Td booster every 10 years.  Zoster vaccine. You may need this after age 26.  Pneumococcal 13-valent conjugate (PCV13) vaccine. One dose is recommended after age 42.  Pneumococcal polysaccharide (PPSV23) vaccine. One dose is recommended after age 56. Talk to your health care provider about which screenings and vaccines you need and how often you need them. This information is not intended to replace advice given to you by your health care provider. Make sure you discuss any questions you have with your health care provider. Document Released: 07/01/2015 Document Revised: 02/22/2016 Document Reviewed: 04/05/2015 Elsevier Interactive Patient Education  2017 [place] Prevention in the Home Falls can cause injuries. They can happen to people of all ages. There are many things you can do to make your home safe and to help prevent falls. What can I do on the outside of my home?  Regularly fix the edges of walkways and driveways and fix any cracks.  Remove anything that might make you trip as you walk through a door, such as a raised step or threshold.  Trim any bushes or trees on the path to your home.  Use bright outdoor lighting.  Clear any walking paths of anything that might make someone trip, such as rocks or tools.  Regularly check to see if handrails are loose or broken. Make sure that both sides of any steps have handrails.  Any raised decks and porches should have guardrails on the edges.  Have any leaves, snow, or ice cleared regularly.  Use sand or salt on walking paths during winter.  Clean up any spills in your garage right away. This includes oil or grease spills. What can I do in the bathroom?  Use night lights.  Install grab bars by the toilet and in the tub and shower. Do not use towel bars as grab bars.  Use non-skid mats or decals in the tub or shower.  If you need to sit down in the shower, use a plastic, non-slip  stool.  Keep the floor dry. Clean up any water that spills on the floor as soon as it happens.  Remove soap buildup in the tub or shower regularly.  Attach bath mats securely with double-sided non-slip rug tape.  Do not have throw rugs and other things on the floor that can make you trip. What can I do in the bedroom?  Use night lights.  Make sure that you have a light by your bed that is easy to reach.  Do not use any sheets or blankets that are too big for your bed. They should not hang down onto the floor.  Have a firm chair that has side arms. You can use this for support while you get dressed.  Do not have throw rugs and other things on the floor that can make you trip. What can I do in the kitchen?  Clean up any spills right away.  Avoid walking on wet floors.  Keep items that you use a lot in easy-to-reach places.  If you need to reach something above you, use a strong step stool that has a grab bar.  Keep electrical cords out of the way.  Do not use floor polish or wax that makes floors slippery. If you must use wax, use non-skid floor wax.  Do not have throw rugs and other things on the floor that can make you trip. What can I do with my stairs?  Do not leave any items on the stairs.  Make sure that there are handrails on both sides of the stairs and use them. Fix handrails that are broken or loose. Make sure that handrails are as long as the stairways.  Check any carpeting to make sure that it is firmly attached to the stairs. Fix any carpet that is loose or worn.  Avoid having throw rugs at the top or bottom of the stairs. If you do have throw rugs, attach them to the floor with carpet tape.  Make sure that you have a light switch at the top of the stairs and the bottom of the stairs. If you do not have them, ask someone to add them for you. What else can I do to help prevent falls?  Wear shoes that:  Do not have high heels.  Have rubber bottoms.  Are  comfortable and fit you well.  Are closed at the toe. Do not wear sandals.  If you use a stepladder:  Make sure that it is fully opened. Do not climb a closed stepladder.  Make sure that both sides of the stepladder are locked into place.  Ask someone to hold it for you, if possible.  Clearly mark and make sure that you can see:  Any grab bars or handrails.  First and last steps.  Where the edge of each step is.  Use tools that help you move around (mobility aids) if they are needed. These include:  Canes.  Walkers.  Scooters.  Crutches.  Turn on the lights when you go into a dark area. Replace any light bulbs as soon as they burn out.  Set up your furniture so you have a clear path. Avoid moving your furniture around.  If any of your floors are uneven, fix them.  If there are any pets around you, be aware of where they are.  Review your medicines with your doctor. Some medicines can make you feel dizzy. This can increase your chance of falling. Ask your doctor what other things that you can do to help prevent falls. This information is not intended to replace advice given to you by your health care provider. Make sure you discuss any questions you have with your health care provider. Document Released: 03/31/2009 Document Revised: 11/10/2015 Document Reviewed: 07/09/2014 Elsevier Interactive Patient Education  2017 [employer].

## 2017-07-20 LAB — COMPREHENSIVE METABOLIC PANEL
A/G RATIO: 1.5 (ref 1.2–2.2)
ALT: 27 IU/L (ref 0–44)
AST: 28 IU/L (ref 0–40)
Albumin: 4.5 g/dL (ref 3.5–4.8)
Alkaline Phosphatase: 53 IU/L (ref 39–117)
BILIRUBIN TOTAL: 0.8 mg/dL (ref 0.0–1.2)
BUN / CREAT RATIO: 15 (ref 10–24)
BUN: 17 mg/dL (ref 8–27)
CHLORIDE: 104 mmol/L (ref 96–106)
CO2: 16 mmol/L — ABNORMAL LOW (ref 20–29)
Calcium: 10 mg/dL (ref 8.6–10.2)
Creatinine, Ser: 1.14 mg/dL (ref 0.76–1.27)
GFR calc non Af Amer: 64 mL/min/{1.73_m2} (ref >59)
GFR, EST AFRICAN AMERICAN: 74 mL/min/{1.73_m2} (ref >59)
Globulin, Total: 3 g/dL (ref 1.5–4.5)
Glucose: 82 mg/dL (ref 65–99)
POTASSIUM: 5.5 mmol/L — AB (ref 3.5–5.2)
SODIUM: 142 mmol/L (ref 134–144)
Total Protein: 7.5 g/dL (ref 6.0–8.5)

## 2017-07-20 LAB — LIPID PANEL
Chol/HDL Ratio: 3.3 ratio (ref 0.0–5.0)
Cholesterol, Total: 223 mg/dL — ABNORMAL HIGH (ref 100–199)
HDL: 67 mg/dL (ref >39)
LDL Calculated: 131 mg/dL — ABNORMAL HIGH (ref 0–99)
TRIGLYCERIDES: 126 mg/dL (ref 0–149)
VLDL Cholesterol Cal: 25 mg/dL (ref 5–40)

## 2017-07-20 LAB — PSA: Prostate Specific Ag, Serum: 0.6 ng/mL (ref 0.0–4.0)

## 2017-07-22 ENCOUNTER — Telehealth: Payer: Self-pay

## 2017-07-22 NOTE — Telephone Encounter (Signed)
-----   Message from Birdie Sons, MD sent at 07/20/2017  9:42 PM EST ----- Cholesterol about the same at 22. Normal PSA, blood sugar, kidney and liver function. Check yearly.

## 2017-07-22 NOTE — Telephone Encounter (Signed)
Tried calling patient and mailbox was full. Will try again later.

## 2017-07-23 DIAGNOSIS — S82842A Displaced bimalleolar fracture of left lower leg, initial encounter for closed fracture: Secondary | ICD-10-CM | POA: Diagnosis not present

## 2017-07-23 NOTE — Telephone Encounter (Signed)
Patient advised as below.  

## 2017-07-24 NOTE — Telephone Encounter (Signed)
Visit complete.

## 2017-08-06 DIAGNOSIS — S82842A Displaced bimalleolar fracture of left lower leg, initial encounter for closed fracture: Secondary | ICD-10-CM | POA: Diagnosis not present

## 2017-09-02 ENCOUNTER — Telehealth: Payer: Self-pay | Admitting: *Deleted

## 2017-09-02 NOTE — Telephone Encounter (Signed)
Patient wife came in office and states that the patient is experiencing knee pain and he went to the pharmacy and the pharmacist suggested he try Diclofenac for the knee pain. Patient is wondering if the Dr can prescribe this medication. Patient pharmacy is [facility] on [address]. If you have any questions you can call patient wife her contact # is [phone]. Please advise. Thank you

## 2017-09-02 NOTE — Telephone Encounter (Signed)
Please review. Thanks!  

## 2017-09-03 ENCOUNTER — Telehealth: Payer: Self-pay | Admitting: Family Medicine

## 2017-09-03 DIAGNOSIS — M2240 Chondromalacia patellae, unspecified knee: Secondary | ICD-10-CM

## 2017-09-03 MED ORDER — DICLOFENAC SODIUM 1 % TD GEL: 4.0000 g | Freq: Four times a day (QID) | TRANSDERMAL | 5 refills | Status: DC

## 2017-09-03 NOTE — Telephone Encounter (Signed)
Patient's wife called back again today to see if Dr. Caryn Section would call in the Diclofenac for pts knees.

## 2017-09-03 NOTE — Telephone Encounter (Signed)
Is he talking about diclofenac tablets or gel. The tablet is hard on stomach and kidneys and cannot take every day. If he is talking about the gel we can send in a prescription.

## 2017-09-03 NOTE — Telephone Encounter (Signed)
Tried returning call. No answer, will try again later.

## 2017-09-03 NOTE — Telephone Encounter (Signed)
Patient's wife stated they would like to get the gel sent in tonight if possible.

## 2017-09-03 NOTE — Telephone Encounter (Signed)
See previous message. Please advise?

## 2017-09-04 NOTE — Telephone Encounter (Signed)
Patient's wife states that the pharmacy sent them a text stating that this medication needs authorization from Dr. Caryn Section.

## 2017-09-04 NOTE — Telephone Encounter (Signed)
Per pharmacy, will need to do a PA. Pharmacy has faxed PA to office.

## 2017-09-05 NOTE — Telephone Encounter (Signed)
PA request received and started.

## 2017-09-06 NOTE — Telephone Encounter (Signed)
PA was approved. Pharmacy was advised.

## 2017-09-23 DIAGNOSIS — C44319 Basal cell carcinoma of skin of other parts of face: Secondary | ICD-10-CM | POA: Diagnosis not present

## 2017-09-23 DIAGNOSIS — C44311 Basal cell carcinoma of skin of nose: Secondary | ICD-10-CM | POA: Diagnosis not present

## 2018-01-03 ENCOUNTER — Other Ambulatory Visit: Payer: Self-pay

## 2018-01-03 ENCOUNTER — Emergency Department
Admission: EM | Admit: 2018-01-03 | Discharge: 2018-01-04 | Disposition: A | Discharge status: Home / Self Care | Payer: Medicare Other | Attending: Emergency Medicine | Admitting: Emergency Medicine

## 2018-01-03 DIAGNOSIS — Z79899 Other long term (current) drug therapy: Secondary | ICD-10-CM | POA: Insufficient documentation

## 2018-01-03 DIAGNOSIS — Z87442 Personal history of urinary calculi: Secondary | ICD-10-CM | POA: Diagnosis not present

## 2018-01-03 DIAGNOSIS — M545 Low back pain: Secondary | ICD-10-CM | POA: Insufficient documentation

## 2018-01-03 DIAGNOSIS — Z87891 Personal history of nicotine dependence: Secondary | ICD-10-CM | POA: Diagnosis not present

## 2018-01-03 DIAGNOSIS — R110 Nausea: Secondary | ICD-10-CM | POA: Diagnosis not present

## 2018-01-03 DIAGNOSIS — Z85828 Personal history of other malignant neoplasm of skin: Secondary | ICD-10-CM | POA: Diagnosis not present

## 2018-01-03 DIAGNOSIS — R350 Frequency of micturition: Secondary | ICD-10-CM | POA: Diagnosis not present

## 2018-01-03 DIAGNOSIS — R319 Hematuria, unspecified: Secondary | ICD-10-CM | POA: Insufficient documentation

## 2018-01-03 DIAGNOSIS — M549 Dorsalgia, unspecified: Secondary | ICD-10-CM | POA: Diagnosis not present

## 2018-01-03 LAB — URINALYSIS, COMPLETE (UACMP) WITH MICROSCOPIC
BILIRUBIN URINE: NEGATIVE
Bacteria, UA: NONE SEEN
Glucose, UA: NEGATIVE mg/dL
KETONES UR: NEGATIVE mg/dL
LEUKOCYTES UA: NEGATIVE
Nitrite: NEGATIVE
PH: 5 (ref 5.0–8.0)
Protein, ur: NEGATIVE mg/dL
SQUAMOUS EPITHELIAL / LPF: NONE SEEN (ref 0–5)
Specific Gravity, Urine: 1.019 (ref 1.005–1.030)

## 2018-01-03 MED ORDER — IBUPROFEN 600 MG PO TABS: 600.0000 mg | ORAL_TABLET | Freq: Three times a day (TID) | ORAL | 0 refills | Status: DC | PRN

## 2018-01-03 MED ORDER — PHENAZOPYRIDINE HCL 200 MG PO TABS
200.0000 mg | ORAL_TABLET | Freq: Once | ORAL | Status: AC
  Administered 2018-01-04: 200 mg via ORAL
  Filled 2018-01-03: qty 1

## 2018-01-03 MED ORDER — TAMSULOSIN HCL 0.4 MG PO CAPS: 0.4000 mg | ORAL_CAPSULE | Freq: Every day | ORAL | 0 refills | Status: DC

## 2018-01-03 MED ORDER — KETOROLAC TROMETHAMINE 30 MG/ML IJ SOLN
30.0000 mg | Freq: Once | INTRAMUSCULAR | Status: AC
  Administered 2018-01-04: 30 mg via INTRAMUSCULAR
  Filled 2018-01-03: qty 1

## 2018-01-03 MED ORDER — HYDROCODONE-ACETAMINOPHEN 5-325 MG PO TABS: 1.0000 | ORAL_TABLET | Freq: Four times a day (QID) | ORAL | 0 refills | Status: DC | PRN

## 2018-01-03 NOTE — ED Notes (Signed)
Patient attempting to void 

## 2018-01-03 NOTE — ED Provider Notes (Signed)
[facility] Emergency Department Provider Note  ____________________________________________   First MD Initiated Contact with Patient 01/03/18 2302     (approximate)  I have reviewed the triage vital signs and the nursing notes.   HISTORY  Chief Complaint Urinary Frequency   HPI Jonathan Greer is a 72 y.o. male who self presents the hospital 1 day of urinary frequency and dark discoloration to his urine.  Some mild left back pain.  No fevers or chills.  No testicular pain.  No trauma.  He says he has had 8 kidney stones in the past and this feels similar.  He has never seen a urologist.  He denies penile discharge.  He does not take any aspirin Plavix or blood thinning medications.  His symptoms came on suddenly and have been intermittent.  Worsened by urination and improved when not urinating.    Past Medical History:  Diagnosis Date  . Basal cell carcinoma    nose  . Hyperlipemia 07/07/2015  . Hyperlipidemia   . Shingles 07/07/2015  . Wears dentures     Patient Active Problem List   Diagnosis Date Noted  . Bimalleolar fracture of left ankle 06/25/2017  . Diverticulosis of large intestine without diverticulitis   . First degree hemorrhoids   . Benign neoplasm of transverse colon   . Hx of colonic polyps   . Benign neoplasm of ascending colon   . History of basal cell carcinoma of skin 01/17/2016  . 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 Hyperlipemia 07/07/2015  . 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 Kidney stone 07/07/2015  . Polyp of sigmoid colon 07/07/2015    Past Surgical History:  Procedure Laterality Date  . CATARACT EXTRACTION W/PHACO Left 03/13/2017   Procedure: CATARACT EXTRACTION PHACO AND INTRAOCULAR LENS PLACEMENT ([place]) LEFT;  Surgeon: Leandrew Koyanagi, MD;  Location: [facility];  Service: Ophthalmology;  Laterality: Left;  IVA TOPICAL LEFT  . CATARACT EXTRACTION W/PHACO Right 04/17/2017   Procedure:  CATARACT EXTRACTION PHACO AND INTRAOCULAR LENS PLACEMENT ([facility]);  Surgeon: Leandrew Koyanagi, MD;  Location: [facility];  Service: Ophthalmology;  Laterality: Right;  IVA TOPICAL RIGHT  . COLONOSCOPY WITH PROPOFOL N/A 07/27/2016   Procedure: COLONOSCOPY WITH PROPOFOL;  Surgeon: [name], MD;  Location: [facility] ENDOSCOPY;  Service: Endoscopy;  Laterality: N/A;  . HERNIA REPAIR    . MOHS SURGERY  01/10/2016   [facility] for [facility]  . TONSILLECTOMY AND ADENOIDECTOMY  1960's    Prior to Admission medications   Medication Sig Start Date End Date Taking? Authorizing Provider  diclofenac sodium (VOLTAREN) 1 % GEL Apply 4 g topically 4 (four) times daily. 09/03/17   Birdie Sons, MD  HYDROcodone-acetaminophen (NORCO) 5-325 MG tablet Take 1 tablet by mouth every 6 (six) hours as needed for up to 7 doses for severe pain. 01/03/18   Darel Hong, MD  ibuprofen (ADVIL,MOTRIN) 600 MG tablet Take 1 tablet (600 mg total) by mouth every 8 (eight) hours as needed. 01/03/18   Darel Hong, MD  tamsulosin (FLOMAX) 0.4 MG CAPS capsule Take 1 capsule (0.4 mg total) by mouth daily. 01/03/18   Darel Hong, MD    Allergies Patient has no known allergies.  Family History  Problem Relation Age of Onset  . Heart failure Mother   . CVA Father   . Lung cancer Brother   . Liver cancer Maternal Grandmother   . Cancer Paternal Grandmother   . Cancer Paternal Grandfather     Social History  Social History   Tobacco Use  . Smoking status: Former Smoker    Packs/day: 2.00    Years: 20.00    Pack years: 40.00    Types: Cigarettes    Last attempt to quit: 06/19/1975    Years since quitting: 42.5  . Smokeless tobacco: Former Systems developer    Types: Chew  . Tobacco comment: Quit in the 70s  Substance Use Topics  . Alcohol use: No    Alcohol/week: 0.0 oz  . Drug use: No    Review of Systems Constitutional: No fever/chills ENT: No sore throat. Cardiovascular: Denies chest pain. Respiratory: Denies  shortness of breath. Gastrointestinal: No abdominal pain.  Positive for nausea, no vomiting.   GU: Positive for urinary frequency Musculoskeletal: Positive for back pain. Neurological: Negative for headaches   ____________________________________________   PHYSICAL EXAM:  VITAL SIGNS: ED Triage Vitals [01/03/18 2259]  Enc Vitals Group     BP (!) 143/97     Pulse Rate 62     Resp 16     Temp 97.9 F (36.6 C)     Temp Source Oral     SpO2 99 %     Weight 240 lb (108.9 kg)     Height 5\' 9"  (1.753 m)     Head Circumference      Peak Flow      Pain Score 8     Pain Loc      Pain Edu?      Excl. in [place]?     Constitutional: Alert and oriented x4 appears somewhat uncomfortable fidgeting on bed although nontoxic no diaphoresis Head: Atraumatic. Nose: No congestion/rhinnorhea. Mouth/Throat: No trismus Neck: No stridor.   Cardiovascular: Regular rate and rhythm Respiratory: Normal respiratory effort.  No retractions. Gastrointestinal: Soft nontender no costovertebral tenderness Neurologic:  Normal speech and language. No gross focal neurologic deficits are appreciated.  Skin:  Skin is warm, dry and intact. No rash noted.    ____________________________________________  LABS (all labs ordered are listed, but only abnormal results are displayed)  Labs Reviewed  URINALYSIS, COMPLETE (UACMP) WITH MICROSCOPIC - Abnormal; Notable for the following components:      Result Value   Color, Urine YELLOW (*)    APPearance CLEAR (*)    Hgb urine dipstick LARGE (*)    RBC / HPF >50 (*)    All other components within normal limits  URINE CULTURE    Urinalysis reviewed by me with hematuria and no evidence of infection __________________________________________  EKG   ____________________________________________  RADIOLOGY   ____________________________________________   DIFFERENTIAL includes but not limited to  Kidney stone, bladder cancer, epididymitis, orchitis,  prostatitis   PROCEDURES  Procedure(s) performed: no  Procedures  Critical Care performed: no  ____________________________________________   INITIAL IMPRESSION / ASSESSMENT AND PLAN / ED COURSE  Pertinent labs & imaging results that were available during my care of the patient were reviewed by me and considered in my medical decision making (see chart for details).   The patient arrives hemodynamically stable and relatively well-appearing.  He has no bacteria no nitrites no leukocytes on his urinalysis no costovertebral tenderness no fevers or chills and no evidence of infection.  He does have a history of kidney stones in the past but has never seen a urologist as he is always passed them on his own.  I discussed the importance of follow-up with urology given his history of previous stones and the fact that hematuria at his age could actually represent bladder  cancer.  He is not on any blood thinning medication and is not obstructed.  He is driving his grandson home so I cannot give him any opioids at this point we will give him a single dose of IM Toradol and discharged home with Norco ibuprofen and Flomax.  Strict return precautions have been given and the patient verbalizes understanding and agreement with the plan.      ____________________________________________   FINAL CLINICAL IMPRESSION(S) / ED DIAGNOSES  Final diagnoses:  Hematuria, unspecified type      NEW MEDICATIONS STARTED DURING THIS VISIT:  New Prescriptions   HYDROCODONE-ACETAMINOPHEN (NORCO) 5-325 MG TABLET    Take 1 tablet by mouth every 6 (six) hours as needed for up to 7 doses for severe pain.   IBUPROFEN (ADVIL,MOTRIN) 600 MG TABLET    Take 1 tablet (600 mg total) by mouth every 8 (eight) hours as needed.   TAMSULOSIN (FLOMAX) 0.4 MG CAPS CAPSULE    Take 1 capsule (0.4 mg total) by mouth daily.     Note:  This document was prepared using Dragon voice recognition software and may include  unintentional dictation errors.      Darel Hong, MD 01/03/18 2358

## 2018-01-03 NOTE — ED Triage Notes (Signed)
Patient reports urinary frequency and dysuria that began this morning.

## 2018-01-03 NOTE — Discharge Instructions (Signed)
Please make an appointment to follow-up with the urologist this coming week for recheck and return to the emergency department sooner for any concerns.  It was a pleasure to take care of you today, and thank you for coming to our emergency department.  If you have any questions or concerns before leaving please ask the nurse to grab me and I'm more than happy to go through your aftercare instructions again.  If you were prescribed any opioid pain medication today such as Norco, Vicodin, Percocet, morphine, hydrocodone, or oxycodone please make sure you do not drive when you are taking this medication as it can alter your ability to drive safely.  If you have any concerns once you are home that you are not improving or are in fact getting worse before you can make it to your follow-up appointment, please do not hesitate to call 911 and come back for further evaluation.  Darel Hong, MD  Results for orders placed or performed during the hospital encounter of 01/03/18  Urinalysis, Complete w Microscopic  Result Value Ref Range   Color, Urine YELLOW (A) YELLOW   APPearance CLEAR (A) CLEAR   Specific Gravity, Urine 1.019 1.005 - 1.030   pH 5.0 5.0 - 8.0   Glucose, UA NEGATIVE NEGATIVE mg/dL   Hgb urine dipstick LARGE (A) NEGATIVE   Bilirubin Urine NEGATIVE NEGATIVE   Ketones, ur NEGATIVE NEGATIVE mg/dL   Protein, ur NEGATIVE NEGATIVE mg/dL   Nitrite NEGATIVE NEGATIVE   Leukocytes, UA NEGATIVE NEGATIVE   RBC / HPF >50 (H) 0 - 5 RBC/hpf   WBC, UA 0-5 0 - 5 WBC/hpf   Bacteria, UA NONE SEEN NONE SEEN   Squamous Epithelial / LPF NONE SEEN 0 - 5   Mucus PRESENT

## 2018-01-05 LAB — URINE CULTURE: Culture: <10000 — AB

## 2018-01-13 NOTE — Progress Notes (Signed)
01/15/2018 10:44 AM   [name] Aug 22, 1945 [id number]  Referring provider: Birdie Sons, [place], [place] [address]  Chief Complaint  Patient presents with  . Hematuria    HPI: Patient is a 72 year old Caucasian male who presents today as a referral from [facility] ED for gross hematuria.    He presented to the ED on 01/03/2018 for urinary frequency and dark urine x 1 day.  UA in the ED was positive for > 50 RBC's.  Urine culture was positive for < 10,000 colonies.    He does have a history of nephrolithiasis.  He states he was having difficulty passing his urine, dysuria and lower back pain.  He is currently on tamsulosin 0.4 mg.    He does not have a prior history of recurrent urinary tract infections, trauma to the genitourinary tract, BPH or malignancies of the genitourinary tract.   He does not have a family medical history of nephrolithiasis, malignancies of the genitourinary tract or hematuria.   Today, she/he are having/not having symptoms of frequent urination, urgency, dysuria, nocturia, incontinence, hesitancy, intermittency, straining to urinate or a weak urinary stream.  Patient denies any gross hematuria, dysuria or suprapubic/flank pain.  Patient denies any fevers, chills, nausea or vomiting.  His UA today is negative.    CT renal stone study in 06/2017 noted both adrenal glands appear normal. There is a 3 mm obstructing calculus at the right ureterovesical junction, most obvious on the reformatted images. There are tiny bilateral renal calculi. In the upper pole of the left kidney is a 10 mm lesion measuring 71 HU (image 29), likely a hemorrhagic cyst based on density. There is mild right-sided hydronephrosis and perinephric soft tissue stranding. There are probable renal sinus cysts bilaterally. The bladder appears unremarkable.  The prostate gland and seminal vesicles appear unremarkable.  He is a former smoker, with a 1 ppd  history.  Quit 42 years ago.  He is not around second hand smoke.  He has not worked with chemicals.  He has a high BMI.     PMH: Past Medical History:  Diagnosis Date  . Basal cell carcinoma    nose  . Hyperlipemia 07/07/2015  . Hyperlipidemia   . Shingles 07/07/2015  . Wears dentures     Surgical History: Past Surgical History:  Procedure Laterality Date  . CATARACT EXTRACTION W/PHACO Left 03/13/2017   Procedure: CATARACT EXTRACTION PHACO AND INTRAOCULAR LENS PLACEMENT ([place]) LEFT;  Surgeon: Leandrew Koyanagi, MD;  Location: [place];  Service: Ophthalmology;  Laterality: Left;  IVA TOPICAL LEFT  . CATARACT EXTRACTION W/PHACO Right 04/17/2017   Procedure: CATARACT EXTRACTION PHACO AND INTRAOCULAR LENS PLACEMENT ([facility]);  Surgeon: Leandrew Koyanagi, MD;  Location: [place];  Service: Ophthalmology;  Laterality: Right;  IVA TOPICAL RIGHT  . COLONOSCOPY WITH PROPOFOL N/A 07/27/2016   Procedure: COLONOSCOPY WITH PROPOFOL;  Surgeon: [name], MD;  Location: [facility] END[facility]COPY;  Service: Endoscopy;  Laterality: N/A;  . HERNIA REPAIR    . MOHS SURGERY  01/10/2016   [facility] for [facility]  . TONSILLECTOMY AND ADENOIDECTOMY  1960's    Home Medications:  Allergies as of 01/15/2018   No Known Allergies     Medication List        Accurate as of 01/15/18 10:44 AM. Always use your most recent med list.          diclofenac sodium 1 % Gel Commonly known as:  VOLTAREN Apply 4 g  topically 4 (four) times daily.   HYDROcodone-acetaminophen 5-325 MG tablet Commonly known as:  NORCO Take 1 tablet by mouth every 6 (six) hours as needed for up to 7 doses for severe pain.   ibuprofen 600 MG tablet Commonly known as:  ADVIL,MOTRIN Take 1 tablet (600 mg total) by mouth every 8 (eight) hours as needed.   tamsulosin 0.4 MG Caps capsule Commonly known as:  FLOMAX Take 1 capsule (0.4 mg total) by mouth daily.       Allergies: No Known Allergies  Family History: Family  History  Problem Relation Age of Onset  . Heart failure Mother   . CVA Father   . Lung cancer Brother   . Liver cancer Maternal Grandmother   . Cancer Paternal Grandmother   . Cancer Paternal Grandfather   . Bladder Cancer Neg Hx   . Prostate cancer Neg Hx   . Kidney cancer Neg Hx     Social History:  reports that he quit smoking about 42 years ago. His smoking use included cigarettes. He has a 40.00 pack-year smoking history. He has quit using smokeless tobacco. His smokeless tobacco use included chew. He reports that he does not drink alcohol or use drugs.  ROS: UROLOGY Frequent Urination?: No Hard to postpone urination?: No Burning/pain with urination?: Yes Get up at night to urinate?: No Leakage of urine?: No Urine stream starts and stops?: No Trouble starting stream?: No Do you have to strain to urinate?: No Blood in urine?: Yes Urinary tract infection?: Yes Sexually transmitted disease?: No Injury to kidneys or bladder?: No Painful intercourse?: No Weak stream?: No Erection problems?: No Penile pain?: No  Gastrointestinal Nausea?: No Vomiting?: No Indigestion/heartburn?: No Diarrhea?: No Constipation?: Yes  Constitutional Fever: No Night sweats?: No Weight loss?: No Fatigue?: No  Skin Skin rash/lesions?: No Itching?: Yes  Eyes Blurred vision?: No Double vision?: No  Ears/Nose/Throat Sore throat?: No Sinus problems?: No  Hematologic/Lymphatic Swollen glands?: No Easy bruising?: No  Cardiovascular Leg swelling?: No Chest pain?: No  Respiratory Cough?: No Shortness of breath?: No  Endocrine Excessive thirst?: No  Musculoskeletal Back pain?: No Joint pain?: Yes  Neurological Headaches?: No Dizziness?: No  Psychologic Depression?: No Anxiety?: No  Physical Exam: BP 131/76 (BP Location: Left Arm, Patient Position: Sitting, Cuff Size: Large)   Pulse 65   Ht 5\' 9"  (1.753 m)   Wt 243 lb (110.2 kg)   BMI 35.88 kg/m     Constitutional:  Well nourished. Alert and oriented, No acute distress. HEENT: [place] AT, moist mucus membranes.  Trachea midline, no masses. Cardiovascular: No clubbing, cyanosis, or edema. Respiratory: Normal respiratory effort, no increased work of breathing. GI: Abdomen is soft, non tender, non distended, no abdominal masses. Liver and spleen not palpable.  No hernias appreciated.  Stool sample for occult testing is not indicated.   GU: No CVA tenderness.  No bladder fullness or masses.  Patient with uncircumcised phallus.  Foreskin easily retracted Urethral meatus is patent.  No penile discharge. No penile lesions or rashes. Balanitis is present.  Scrotum without lesions, cysts, rashes and/or edema.  Testicles are located scrotally bilaterally. No masses are appreciated in the testicles. Left and right epididymis are normal. Rectal: Patient with  normal sphincter tone. Anus and perineum without scarring or rashes. No rectal masses are appreciated. Prostate is approximately 55 grams, no nodules are appreciated. Seminal vesicles are normal. Skin: No rashes, bruises or suspicious lesions. Lymph: No cervical or inguinal adenopathy. Neurologic: Grossly intact, no  focal deficits, moving all 4 extremities. Psychiatric: Normal mood and affect.  Laboratory Data: Lab Results  Component Value Date   WBC 9.1 06/28/2017   HGB 16.0 06/28/2017   HCT 47.3 06/28/2017   MCV 91.7 06/28/2017   PLT 210 06/28/2017    Lab Results  Component Value Date   CREATININE 1.14 07/19/2017    Lab Results  Component Value Date   PSA 0.5 10/08/2014    No results found for: TESTOSTERONE  No results found for: HGBA1C  Lab Results  Component Value Date   TSH 1.970 06/28/2016       Component Value Date/Time   CHOL 223 (H) 07/19/2017 1027   HDL 67 07/19/2017 1027   CHOLHDL 3.3 07/19/2017 1027   LDLCALC 131 (H) 07/19/2017 1027    Lab Results  Component Value Date   AST 28 07/19/2017   Lab Results   Component Value Date   ALT 27 07/19/2017   No components found for: ALKALINEPHOPHATASE No components found for: BILIRUBINTOTAL  No results found for: ESTRADIOL   Urinalysis Negative.  See Epic and HPI.   I have reviewed the labs.  Pertinent Imaging: CLINICAL DATA:  Right flank pain for 1 day. Dark colored urine. History of kidney stones.  EXAM: CT ABDOMEN AND PELVIS WITHOUT CONTRAST  TECHNIQUE: Multidetector CT imaging of the abdomen and pelvis was performed following the standard protocol without IV contrast.  COMPARISON:  Abdominopelvic CT 02/18/2008  FINDINGS: Lower chest: Clear lung bases. No significant pleural or pericardial effusion.  Hepatobiliary: The liver demonstrates diffusely decreased density consistent with steatosis. This has slightly improved compared with the prior study. No focal abnormalities are seen. No evidence of gallstones, gallbladder wall thickening or biliary dilatation.  Pancreas: Unremarkable. No pancreatic ductal dilatation or surrounding inflammatory changes.  Spleen: Normal in size without focal abnormality.  Adrenals/Urinary Tract: Both adrenal glands appear normal. There is a 3 mm obstructing calculus at the right ureterovesical junction, most obvious on the reformatted images. There are tiny bilateral renal calculi. In the upper pole of the left kidney is a 10 mm lesion measuring 71 HU (image 29), likely a hemorrhagic cyst based on density. There is mild right-sided hydronephrosis and perinephric soft tissue stranding. There are probable renal sinus cysts bilaterally. The bladder appears unremarkable.  Stomach/Bowel: No evidence of bowel wall thickening, distention or surrounding inflammatory change. Mild sigmoid colon diverticular changes without surrounding inflammation.  Vascular/Lymphatic: There are no enlarged abdominal or pelvic lymph nodes. Mild aortic and branch vessel atherosclerosis.  Reproductive: The  prostate gland and seminal vesicles appear unremarkable.  Other: No evidence of abdominal wall mass or hernia. No ascites.  Musculoskeletal: No acute or significant osseous findings.  IMPRESSION: 1. Obstructing 3 mm calculus at the right ureterovesical junction. 2. Small nonobstructing bilateral renal calculi. 3. Probable hemorrhagic cyst in the upper pole of the left kidney. 4. Sigmoid diverticulosis without evidence of acute inflammation. 5. Hepatic steatosis.   Electronically Signed   By: Richardean Sale M.D.   On: 06/28/2017 12:50   Assessment & Plan:    1. Gross hematuria Explained to the patient that there are a number of causes that can be associated with blood in the urine, such as stones, BPH, UTI's, damage to the urinary tract and/or cancer. At this time, I felt that the patient warranted further urologic evaluation.   The AUA guidelines state that a CT urogram is the preferred imaging study to evaluate hematuria. I explained to the patient that a contrast  material will be injected into a vein and that in rare instances, an allergic reaction can result and may even life threatening   The patient denies any allergies to contrast, iodine and/or seafood and is not taking metformin. Following the imaging study,  I've recommended a cystoscopy. I described how this is performed, typically in an office setting with a flexible cystoscope. We described the risks, benefits, and possible side effects, the most common of which is a minor amount of blood in the urine and/or burning which usually resolves in 24 to 48 hours.   The patient had the opportunity to ask questions which were answered. Based upon this discussion, the patient is willing to proceed. Therefore, I've ordered: a CT Urogram and cystoscopy.   - The patient will return following all of the above for discussion of the results.  UA negative Urine culture pending BUN + creatinine pending   2. Balanitis Nystatin  cream bid sent to is pharmacy     Return for CT Urogram report and cystoscopy.  These notes generated with voice recognition software. I apologize for typographical errors.  Zara Council, PA-C  [facility] [address] [place]  [place], [place] [address] [phone]

## 2018-01-15 ENCOUNTER — Ambulatory Visit: Payer: Medicare Other | Admitting: Urology

## 2018-01-15 ENCOUNTER — Encounter: Payer: Self-pay | Admitting: Urology

## 2018-01-15 VITALS — BP 131/76 | HR 65 | Ht 69.0 in | Wt 243.0 lb

## 2018-01-15 DIAGNOSIS — N481 Balanitis: Secondary | ICD-10-CM

## 2018-01-15 DIAGNOSIS — R310 Gross hematuria: Secondary | ICD-10-CM | POA: Diagnosis not present

## 2018-01-15 LAB — MICROSCOPIC EXAMINATION
Bacteria, UA: NONE SEEN
RBC, UA: NONE SEEN /hpf (ref 0–2)

## 2018-01-15 LAB — URINALYSIS, COMPLETE
Bilirubin, UA: NEGATIVE
Glucose, UA: NEGATIVE
KETONES UA: NEGATIVE
Leukocytes, UA: NEGATIVE
NITRITE UA: NEGATIVE
Protein, UA: NEGATIVE
Specific Gravity, UA: >1.03 — ABNORMAL HIGH (ref 1.005–1.030)
Urobilinogen, Ur: 0.2 mg/dL (ref 0.2–1.0)
pH, UA: 5.5 (ref 5.0–7.5)

## 2018-01-15 MED ORDER — NYSTATIN 100000 UNIT/GM EX CREA: 1.0000 "application " | TOPICAL_CREAM | Freq: Two times a day (BID) | CUTANEOUS | 0 refills | Status: DC

## 2018-01-16 LAB — BUN+CREAT
BUN/Creatinine Ratio: 12 (ref 10–24)
BUN: 14 mg/dL (ref 8–27)
CREATININE: 1.17 mg/dL (ref 0.76–1.27)
GFR calc Af Amer: 72 mL/min/{1.73_m2} (ref >59)
GFR calc non Af Amer: 62 mL/min/{1.73_m2} (ref >59)

## 2018-01-18 LAB — CULTURE, URINE COMPREHENSIVE

## 2018-01-22 IMAGING — DX DG FINGER THUMB 2+V*L*
3 series · 3 of 3 positions shown · non-contrast
Comparison: No recent prior.

CLINICAL DATA: Nail and left thumb.  Pain.

EXAM:
LEFT THUMB 2+V

[finger ap]
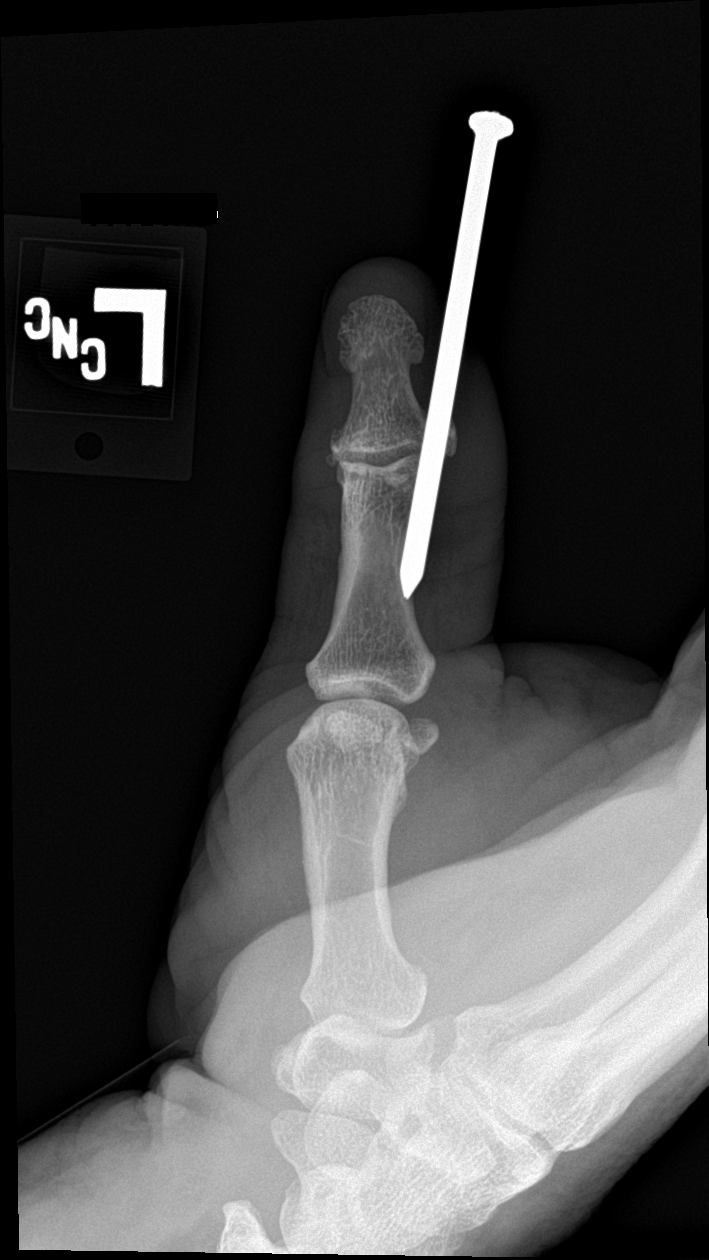

[finger obl]
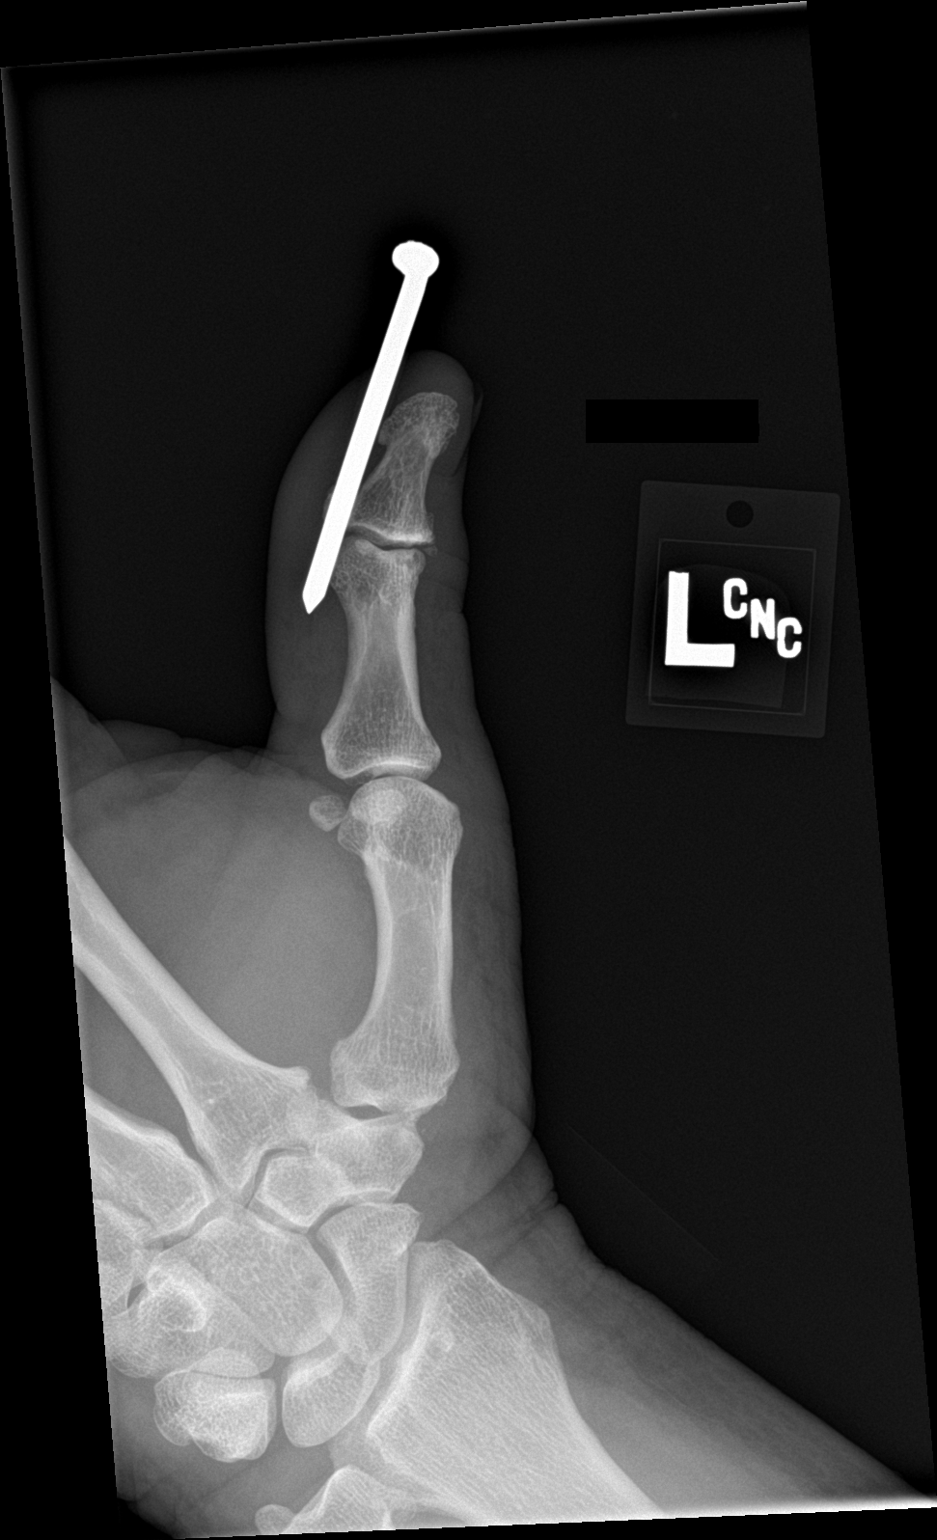

[finger lat]
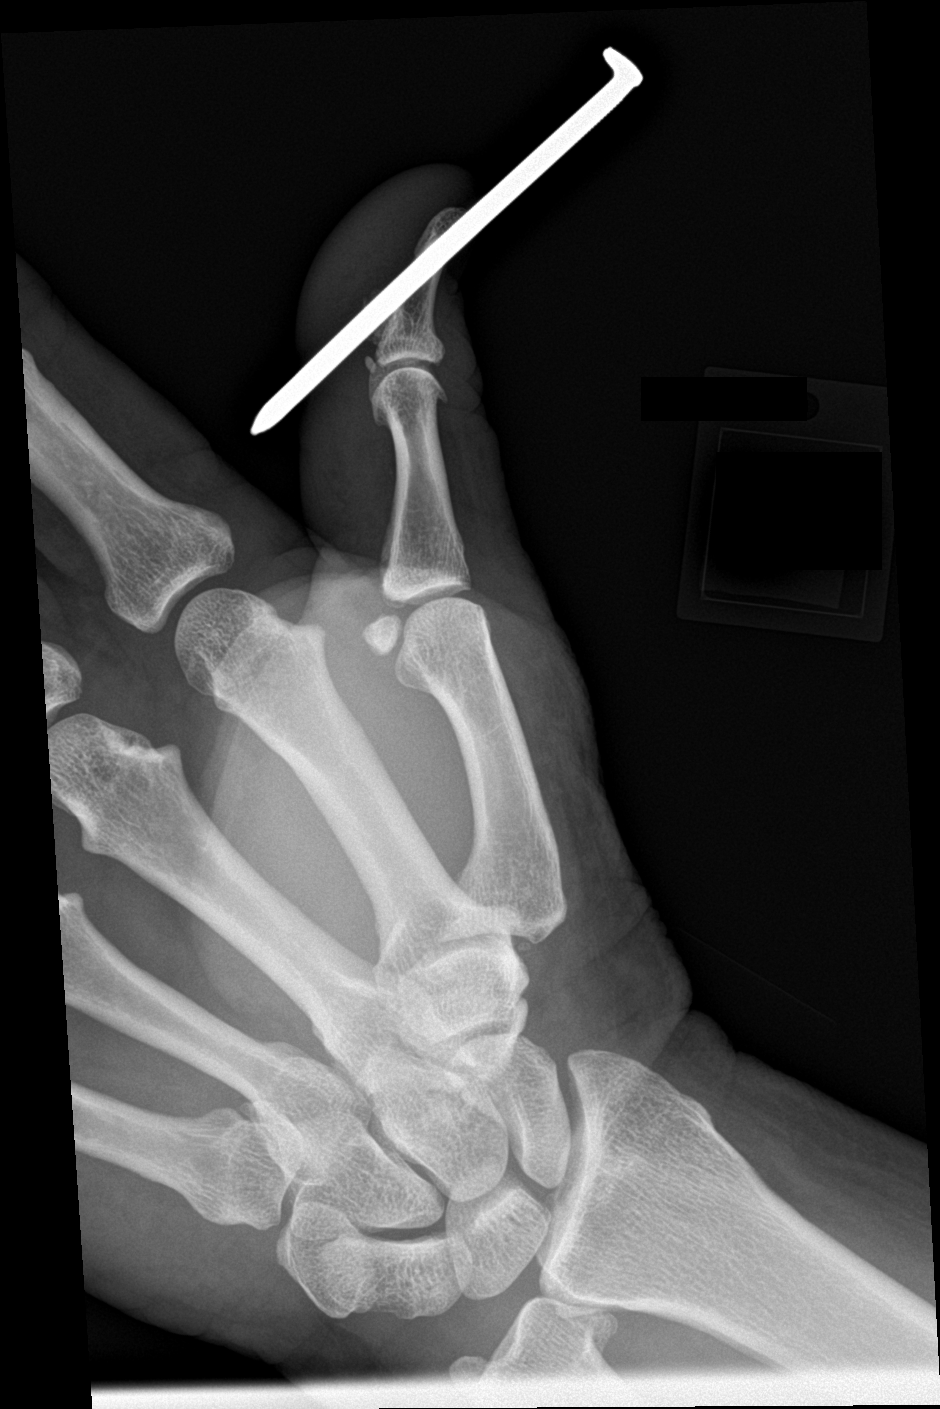

[3 of 3 positions shown; findings below may reference images not displayed]

FINDINGS: A nail is noted within the soft tissues of the anterior ulnar aspect
of the left thumb. Definite bony involvement is not identified. No
acute fracture. Diffuse degenerative change.
IMPRESSION: A nail is noted in the soft tissues of the thumb. Definite bony
involvement is not demonstrated. Follow-up imaging following removal
suggested .

## 2018-02-19 ENCOUNTER — Ambulatory Visit
Admission: RE | Admit: 2018-02-19 | Discharge: 2018-02-19 | Disposition: A | Discharge status: Home / Self Care | Payer: Medicare Other | Source: Ambulatory Visit | Attending: Urology | Admitting: Urology

## 2018-02-19 DIAGNOSIS — R310 Gross hematuria: Secondary | ICD-10-CM

## 2018-02-19 DIAGNOSIS — N289 Disorder of kidney and ureter, unspecified: Secondary | ICD-10-CM | POA: Diagnosis not present

## 2018-02-19 DIAGNOSIS — N200 Calculus of kidney: Secondary | ICD-10-CM | POA: Insufficient documentation

## 2018-02-19 MED ORDER — IOPAMIDOL (ISOVUE-300) INJECTION 61%
125.0000 mL | Freq: Once | INTRAVENOUS | Status: AC | PRN
  Administered 2018-02-19: 125 mL via INTRAVENOUS

## 2018-02-24 ENCOUNTER — Ambulatory Visit: Payer: Medicare Other | Admitting: Urology

## 2018-02-24 ENCOUNTER — Encounter: Payer: Self-pay | Admitting: Urology

## 2018-02-24 VITALS — BP 153/94 | HR 56 | Ht 69.0 in | Wt 239.0 lb

## 2018-02-24 DIAGNOSIS — N281 Cyst of kidney, acquired: Secondary | ICD-10-CM

## 2018-02-24 DIAGNOSIS — R310 Gross hematuria: Secondary | ICD-10-CM | POA: Diagnosis not present

## 2018-02-24 MED ORDER — LIDOCAINE HCL URETHRAL/MUCOSAL 2 % EX GEL: 1.0000 "application " | Freq: Once | CUTANEOUS | Status: DC

## 2018-02-24 NOTE — Progress Notes (Signed)
   02/24/18  CC:  Chief Complaint  Patient presents with  . Cysto    HPI: 72 year old male who saw Zara Council after an episode of gross hematuria.  CT urogram shows punctate nephrolithiasis, simple renal cysts and a probable proteinaceous cyst.  A 87-month follow-up renal mass protocol MRI was recommended.  Blood pressure (!) 153/94, pulse (!) 56, height 5\' 9"  (1.753 m), weight 239 lb (108.4 kg). NED. A&Ox3.   No respiratory distress   Abd soft, NT, ND Normal phallus with bilateral descended testicles  Cystoscopy Procedure Note  Patient identification was confirmed, informed consent was obtained, and patient was prepped using Betadine solution.  Lidocaine jelly was administered per urethral meatus.    Preoperative abx where received prior to procedure.     Pre-Procedure: - Inspection reveals a normal caliber ureteral meatus.  Procedure: The flexible cystoscope was introduced without difficulty - No urethral strictures/lesions are present. - Moderate enlargement prostate  - Mild elevation bladder neck - Bilateral ureteral orifices identified - Bladder mucosa  reveals no ulcers, tumors, or lesions - No bladder stones - No trabeculation  Retroflexion shows no intravesical median lobe   Post-Procedure: - Patient tolerated the procedure well  Assessment/ Plan: No significant abnormalities identified on cystoscopy.  A six-month follow-up renal mass protocol MRI was recommended.   John Giovanni, MD

## 2018-02-25 LAB — URINALYSIS, COMPLETE
Bilirubin, UA: NEGATIVE
GLUCOSE, UA: NEGATIVE
KETONES UA: NEGATIVE
LEUKOCYTES UA: NEGATIVE
Nitrite, UA: NEGATIVE
Protein, UA: NEGATIVE
RBC, UA: NEGATIVE
Specific Gravity, UA: 1.025 (ref 1.005–1.030)
Urobilinogen, Ur: 0.2 mg/dL (ref 0.2–1.0)
pH, UA: 5.5 (ref 5.0–7.5)

## 2018-04-15 DIAGNOSIS — H524 Presbyopia: Secondary | ICD-10-CM | POA: Diagnosis not present

## 2018-07-16 ENCOUNTER — Telehealth: Payer: Self-pay

## 2018-07-16 NOTE — Telephone Encounter (Signed)
LMTCB and schedule AWV for this year. -MM 

## 2018-08-07 NOTE — Telephone Encounter (Signed)
Scheduled AWV for 08/28/18 @ 1:20 PM. -MM

## 2018-08-12 ENCOUNTER — Ambulatory Visit: Payer: Self-pay

## 2018-08-26 ENCOUNTER — Encounter: Payer: Self-pay | Admitting: Urology

## 2018-08-26 ENCOUNTER — Ambulatory Visit: Payer: Medicare Other | Admitting: Urology

## 2018-08-28 ENCOUNTER — Ambulatory Visit (INDEPENDENT_AMBULATORY_CARE_PROVIDER_SITE_OTHER): Payer: Medicare Other

## 2018-08-28 ENCOUNTER — Other Ambulatory Visit: Payer: Self-pay

## 2018-08-28 VITALS — BP 126/72 | HR 64 | Temp 97.6°F | Ht 69.0 in | Wt 243.0 lb

## 2018-08-28 DIAGNOSIS — Z0000 Encounter for general adult medical examination without abnormal findings: Secondary | ICD-10-CM

## 2018-08-28 DIAGNOSIS — Z1211 Encounter for screening for malignant neoplasm of colon: Secondary | ICD-10-CM

## 2018-08-28 NOTE — Patient Instructions (Signed)
Jonathan Greer , Thank you for taking time to come for your Medicare Wellness Visit. I appreciate your ongoing commitment to your health goals. Please review the following plan we discussed and let me know if I can assist you in the future.   Screening recommendations/referrals: Colonoscopy: Ordered today. Pt aware that office with call to set up apt.  Recommended yearly ophthalmology/optometry visit for glaucoma screening and checkup Recommended yearly dental visit for hygiene and checkup  Vaccinations: Influenza vaccine: Up to date Pneumococcal vaccine: Completed series Tdap vaccine: Up to date, due 12/2026 Shingles vaccine: Pt declines today.     Advanced directives: Please bring a copy of your POA (Power of Attorney) and/or Living Will to your next appointment.   Conditions/risks identified: Obesity- continue to work on cutting out carbohydrates and sugar in diet and increase water intake to 6-8 8 oz glasses a day.   Next appointment: 10/17/18 @ 9:00 AM with Dr Caryn Section.   Preventive Care 40 Years and Older, Male Preventive care refers to lifestyle choices and visits with your health care provider that can promote health and wellness. What does preventive care include?  A yearly physical exam. This is also called an annual well check.  Dental exams once or twice a year.  Routine eye exams. Ask your health care provider how often you should have your eyes checked.  Personal lifestyle choices, including:  Daily care of your teeth and gums.  Regular physical activity.  Eating a healthy diet.  Avoiding tobacco and drug use.  Limiting alcohol use.  Practicing safe sex.  Taking low doses of aspirin every day.  Taking vitamin and mineral supplements as recommended by your health care provider. What happens during an annual well check? The services and screenings done by your health care provider during your annual well check will depend on your age, overall health, lifestyle  risk factors, and family history of disease. Counseling  Your health care provider may ask you questions about your:  Alcohol use.  Tobacco use.  Drug use.  Emotional well-being.  Home and relationship well-being.  Sexual activity.  Eating habits.  History of falls.  Memory and ability to understand (cognition).  Work and work Statistician. Screening  You may have the following tests or measurements:  Height, weight, and BMI.  Blood pressure.  Lipid and cholesterol levels. These may be checked every 5 years, or more frequently if you are over 17 years old.  Skin check.  Lung cancer screening. You may have this screening every year starting at age 28 if you have a 30-pack-year history of smoking and currently smoke or have quit within the past 15 years.  Fecal occult blood test (FOBT) of the stool. You may have this test every year starting at age 54.  Flexible sigmoidoscopy or colonoscopy. You may have a sigmoidoscopy every 5 years or a colonoscopy every 10 years starting at age 37.  Prostate cancer screening. Recommendations will vary depending on your family history and other risks.  Hepatitis C blood test.  Hepatitis B blood test.  Sexually transmitted disease (STD) testing.  Diabetes screening. This is done by checking your blood sugar (glucose) after you have not eaten for a while (fasting). You may have this done every 1-3 years.  Abdominal aortic aneurysm (AAA) screening. You may need this if you are a current or former smoker.  Osteoporosis. You may be screened starting at age 54 if you are at high risk. Talk with your health care provider about  your test results, treatment options, and if necessary, the need for more tests. Vaccines  Your health care provider may recommend certain vaccines, such as:  Influenza vaccine. This is recommended every year.  Tetanus, diphtheria, and acellular pertussis (Tdap, Td) vaccine. You may need a Td booster every 10  years.  Zoster vaccine. You may need this after age 46.  Pneumococcal 13-valent conjugate (PCV13) vaccine. One dose is recommended after age 79.  Pneumococcal polysaccharide (PPSV23) vaccine. One dose is recommended after age 52. Talk to your health care provider about which screenings and vaccines you need and how often you need them. This information is not intended to replace advice given to you by your health care provider. Make sure you discuss any questions you have with your health care provider. Document Released: 07/01/2015 Document Revised: 02/22/2016 Document Reviewed: 04/05/2015 Elsevier Interactive Patient Education  2017 [place] Prevention in the Home Falls can cause injuries. They can happen to people of all ages. There are many things you can do to make your home safe and to help prevent falls. What can I do on the outside of my home?  Regularly fix the edges of walkways and driveways and fix any cracks.  Remove anything that might make you trip as you walk through a door, such as a raised step or threshold.  Trim any bushes or trees on the path to your home.  Use bright outdoor lighting.  Clear any walking paths of anything that might make someone trip, such as rocks or tools.  Regularly check to see if handrails are loose or broken. Make sure that both sides of any steps have handrails.  Any raised decks and porches should have guardrails on the edges.  Have any leaves, snow, or ice cleared regularly.  Use sand or salt on walking paths during winter.  Clean up any spills in your garage right away. This includes oil or grease spills. What can I do in the bathroom?  Use night lights.  Install grab bars by the toilet and in the tub and shower. Do not use towel bars as grab bars.  Use non-skid mats or decals in the tub or shower.  If you need to sit down in the shower, use a plastic, non-slip stool.  Keep the floor dry. Clean up any water that  spills on the floor as soon as it happens.  Remove soap buildup in the tub or shower regularly.  Attach bath mats securely with double-sided non-slip rug tape.  Do not have throw rugs and other things on the floor that can make you trip. What can I do in the bedroom?  Use night lights.  Make sure that you have a light by your bed that is easy to reach.  Do not use any sheets or blankets that are too big for your bed. They should not hang down onto the floor.  Have a firm chair that has side arms. You can use this for support while you get dressed.  Do not have throw rugs and other things on the floor that can make you trip. What can I do in the kitchen?  Clean up any spills right away.  Avoid walking on wet floors.  Keep items that you use a lot in easy-to-reach places.  If you need to reach something above you, use a strong step stool that has a grab bar.  Keep electrical cords out of the way.  Do not use floor polish or wax  that makes floors slippery. If you must use wax, use non-skid floor wax.  Do not have throw rugs and other things on the floor that can make you trip. What can I do with my stairs?  Do not leave any items on the stairs.  Make sure that there are handrails on both sides of the stairs and use them. Fix handrails that are broken or loose. Make sure that handrails are as long as the stairways.  Check any carpeting to make sure that it is firmly attached to the stairs. Fix any carpet that is loose or worn.  Avoid having throw rugs at the top or bottom of the stairs. If you do have throw rugs, attach them to the floor with carpet tape.  Make sure that you have a light switch at the top of the stairs and the bottom of the stairs. If you do not have them, ask someone to add them for you. What else can I do to help prevent falls?  Wear shoes that:  Do not have high heels.  Have rubber bottoms.  Are comfortable and fit you well.  Are closed at the  toe. Do not wear sandals.  If you use a stepladder:  Make sure that it is fully opened. Do not climb a closed stepladder.  Make sure that both sides of the stepladder are locked into place.  Ask someone to hold it for you, if possible.  Clearly mark and make sure that you can see:  Any grab bars or handrails.  First and last steps.  Where the edge of each step is.  Use tools that help you move around (mobility aids) if they are needed. These include:  Canes.  Walkers.  Scooters.  Crutches.  Turn on the lights when you go into a dark area. Replace any light bulbs as soon as they burn out.  Set up your furniture so you have a clear path. Avoid moving your furniture around.  If any of your floors are uneven, fix them.  If there are any pets around you, be aware of where they are.  Review your medicines with your doctor. Some medicines can make you feel dizzy. This can increase your chance of falling. Ask your doctor what other things that you can do to help prevent falls. This information is not intended to replace advice given to you by your health care provider. Make sure you discuss any questions you have with your health care provider. Document Released: 03/31/2009 Document Revised: 11/10/2015 Document Reviewed: 07/09/2014 Elsevier Interactive Patient Education  2017 [employer].

## 2018-08-28 NOTE — Progress Notes (Signed)
Subjective:   Jonathan Greer is a 73 y.o. male who presents for Medicare Annual/Subsequent preventive examination.  Review of Systems:  N/A  Cardiac Risk Factors include: advanced age (>89men, >36 women);obesity (BMI >30kg/m2);male gender     Objective:    Vitals: BP 126/72 (BP Location: Right Arm)   Pulse 64   Temp 97.6 F (36.4 C) (Oral)   Ht 5\' 9"  (1.753 m)   Wt 243 lb (110.2 kg)   BMI 35.88 kg/m   Body mass index is 35.88 kg/m.  Advanced Directives 08/28/2018 01/03/2018 07/19/2017 06/28/2017 06/24/2017 04/17/2017 03/13/2017  Does Patient Have a Medical Advance Directive? Yes No No No No No No  Type of Paramedic of [place];Living will - - - - - -  Copy of [place] in Chart? No - copy requested - - - - - -  Would patient like information on creating a medical advance directive? - - Yes (MAU/Ambulatory/Procedural Areas - Information given) No - Patient declined No - Patient declined No - Patient declined No - Patient declined    Tobacco Social History   Tobacco Use  Smoking Status Former Smoker  . Packs/day: 2.00  . Years: 20.00  . Pack years: 40.00  . Types: Cigarettes  . Last attempt to quit: 06/19/1975  . Years since quitting: 43.2  Smokeless Tobacco Former Systems developer  . Types: Chew  Tobacco Comment   Quit in the 7s     Counseling given: Not Answered Comment: Quit in the 70s   Clinical Intake:  Pre-visit preparation completed: Yes  Pain : No/denies pain Pain Score: 0-No pain     Nutritional Status: BMI > 30  Obese Nutritional Risks: None Diabetes: No  How often do you need to have someone help you when you read instructions, pamphlets, or other written materials from your doctor or pharmacy?: 1 - Never  Interpreter Needed?: No  Information entered by :: [facility], LPN  Past Medical History:  Diagnosis Date  . Basal cell carcinoma    nose  . Hyperlipemia 07/07/2015  . Hyperlipidemia   . Shingles  07/07/2015  . Wears dentures    Past Surgical History:  Procedure Laterality Date  . CATARACT EXTRACTION W/PHACO Left 03/13/2017   Procedure: CATARACT EXTRACTION PHACO AND INTRAOCULAR LENS PLACEMENT ([place]) LEFT;  Surgeon: Leandrew Koyanagi, MD;  Location: [facility] [place];  Service: Ophthalmology;  Laterality: Left;  IVA TOPICAL LEFT  . CATARACT EXTRACTION W/PHACO Right 04/17/2017   Procedure: CATARACT EXTRACTION PHACO AND INTRAOCULAR LENS PLACEMENT (IOC);  Surgeon: Leandrew Koyanagi, MD;  Location: [place];  Service: Ophthalmology;  Laterality: Right;  IVA TOPICAL RIGHT  . COLONOSCOPY WITH PROPOFOL N/A 07/27/2016   Procedure: COLONOSCOPY WITH PROPOFOL;  Surgeon: [name], MD;  Location: [facility];  Service: Endoscopy;  Laterality: N/A;  . HERNIA REPAIR    . MOHS SURGERY  01/10/2016   [facility]  . TONSILLECTOMY AND ADENOIDECTOMY  1960's   Family History  Problem Relation Age of Onset  . Heart failure Mother   . CVA Father   . Lung cancer Brother   . Liver cancer Maternal Grandmother   . Cancer Paternal Grandmother   . Cancer Paternal Grandfather   . Bladder Cancer Neg Hx   . Prostate cancer Neg Hx   . Kidney cancer Neg Hx    Social History   Socioeconomic History  . Marital status: Married    Spouse name: Not on file  . Number of  children: 0  . Years of education: Not on file  . Highest education level: 10th grade  Occupational History  . 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 Financial resource strain: Not hard at all  . Food insecurity:    Worry: Never true    Inability: Never true  . Transportation needs:    Medical: No    Non-medical: No  Tobacco Use  . Smoking status: Former Smoker    Packs/day: 2.00    Years: 20.00    Pack years: 40.00    Types: Cigarettes    Last attempt to quit: 06/19/1975    Years since quitting: 43.2  . Smokeless tobacco: Former Systems developer     Types: Chew  . Tobacco comment: Quit in the 23s  Substance and Sexual Activity  . Alcohol use: No    Alcohol/week: 0.0 standard drinks  . Drug use: No  . Sexual activity: Not on file  Lifestyle  . Physical activity:    Days per week: Not on file    Minutes per session: Not on file  . Stress: Not at all  Relationships  . Social connections:    Talks on phone: Patient refused    Gets together: Patient refused    Attends religious service: Patient refused    Active member of club or organization: Patient refused    Attends meetings of clubs or organizations: Patient refused    Relationship status: Patient refused  Other Topics Concern  . Not on file  Social History Narrative   Working 5 days a week in [employer] (06/2016)    Outpatient Encounter Medications as of 08/28/2018  Medication Sig  . ibuprofen (ADVIL,MOTRIN) 600 MG tablet Take 1 tablet (600 mg total) by mouth every 8 (eight) hours as needed.  . diclofenac sodium (VOLTAREN) 1 % GEL Apply 4 g topically 4 (four) times daily. (Patient not taking: Reported on 08/28/2018)  . HYDROcodone-acetaminophen (NORCO) 5-325 MG tablet Take 1 tablet by mouth every 6 (six) hours as needed for up to 7 doses for severe pain. (Patient not taking: Reported on 02/24/2018)  . nystatin cream (MYCOSTATIN) Apply 1 application topically 2 (two) times daily. (Patient not taking: Reported on 08/28/2018)  . tamsulosin (FLOMAX) 0.4 MG CAPS capsule Take 1 capsule (0.4 mg total) by mouth daily. (Patient not taking: Reported on 08/28/2018)   Facility-Administered Encounter Medications as of 08/28/2018  Medication  . lidocaine (XYLOCAINE) 2 % jelly 1 application    Activities of Daily Living In your present state of health, do you have any difficulty performing the following activities: 08/28/2018  Hearing? Y  Comment Does not wear hearing aids.   Vision? N  Difficulty concentrating or making decisions? N  Walking or climbing stairs? N  Dressing or bathing?  N  Doing errands, shopping? N  Preparing Food and eating ? N  Using the Toilet? N  In the past six months, have you accidently leaked urine? N  Do you have problems with loss of bowel control? N  Managing your Medications? N  Managing your Finances? N  Housekeeping or managing your Housekeeping? N  Some recent data might be hidden    Patient Care Team: Birdie Sons, MD as PCP - General (Family Medicine) [place], [place] as Consulting Physician (Optometry) [name], MD as Consulting Physician (Gastroenterology) Tamsen Meek, MD as Referring Physician (Dermatology)   Assessment:   This  is a routine wellness examination for [place].  Exercise Activities and Dietary recommendations Current Exercise Habits: The patient has a physically strenuous job, but has no regular exercise apart from work., Exercise limited by: None identified  Goals    . Diet      Recommend to continue with current diet plan of cutting out all foods that are white (focusing on carbs and sugars).     Marland Kitchen DIET - INCREASE WATER INTAKE     Recommend to drink at least 6-8 8oz glasses of water per day.       Fall Risk: Fall Risk  08/28/2018 07/19/2017 06/27/2016  Falls in the past year? 0 Yes No  Number falls in past yr: - 1 -  Comment - fell off later -  Injury with Fall? - Yes -  Comment - broken left ankle -  Follow up - Falls prevention discussed -    FALL RISK PREVENTION PERTAINING TO THE HOME:  Any stairs in or around the home? No  If so, are there any without handrails? N/A  Home free of loose throw rugs in walkways, pet beds, electrical cords, etc? Yes  Adequate lighting in your home to reduce risk of falls? Yes   ASSISTIVE DEVICES UTILIZED TO PREVENT FALLS:  Life alert? No  Use of a cane, walker or w/c? No  Grab bars in the bathroom? No  Shower chair or bench in shower? No  Elevated toilet seat or a handicapped toilet? No   TIMED UP AND GO:  Was the test performed? No .     Depression Screen PHQ 2/9 Scores 08/28/2018 08/28/2018 07/19/2017 07/19/2017  PHQ - 2 Score 0 0 0 0  PHQ- 9 Score 0 - 0 -    Cognitive Function     6CIT Screen 08/28/2018  What Year? 0 points  What month? 0 points  What time? 0 points  Count back from 20 0 points  Months in reverse 0 points  Repeat phrase 0 points  Total Score 0    Immunization History  Administered Date(s) Administered  . Influenza, High Dose Seasonal PF 06/27/2016, 04/26/2017, 05/02/2018  . Pneumococcal Conjugate-13 10/07/2014  . Pneumococcal Polysaccharide-23 03/18/2013  . Tdap 06/30/2010, 01/15/2017    Qualifies for Shingles Vaccine? Yes . Due for Shingrix. Education has been provided regarding the importance of this vaccine. Pt has been advised to call insurance company to determine out of pocket expense. Advised may also receive vaccine at local pharmacy or Health Dept. Verbalized acceptance and understanding.  Tdap: Up to date  Flu Vaccine: Up to date  Pneumococcal Vaccine: Up to date  Screening Tests Health Maintenance  Topic Date Due  . COLONOSCOPY  07/27/2017  . TETANUS/TDAP  01/16/2027  . INFLUENZA VACCINE  Completed  . Hepatitis C Screening  Completed  . PNA vac Low Risk Adult  Completed   Cancer Screenings:  Colorectal Screening: Completed 07/27/16. Repeat every 6 months; Referral to GI placed today. Pt aware the office will call re: appt.  Lung Cancer Screening: (Low Dose CT Chest recommended if Age 72-80 years, 30 pack-year currently smoking OR have quit w/in 15years.) does not qualify.   Additional Screening:  Hepatitis C Screening: Up to date  Vision Screening: Recommended annual ophthalmology exams for early detection of glaucoma and other disorders of the eye.  Dental Screening: Recommended annual dental exams for proper oral hygiene  Community Resource Referral:  CRR required this visit?  No        Plan:  I have personally reviewed and addressed the Medicare Annual  Wellness questionnaire and have noted the following in the patient's chart:  A. Medical and social history B. Use of alcohol, tobacco or illicit drugs  C. Current medications and supplements D. Functional ability and status E.  Nutritional status F.  Physical activity G. Advance directives H. List of other physicians I.  Hospitalizations, surgeries, and ER visits in previous 12 months J.  [place] such as hearing and vision if needed, cognitive and depression L. Referrals and appointments   In addition, I have reviewed and discussed with patient certain preventive protocols, quality metrics, and best practice recommendations. A written personalized care plan for preventive services as well as general preventive health recommendations were provided to patient.   [place] Score, [place]  2/50/5397 Nurse Health Advisor   Nurse Notes: None.

## 2018-09-02 ENCOUNTER — Telehealth: Payer: Self-pay | Admitting: Urology

## 2018-09-02 ENCOUNTER — Encounter: Payer: Self-pay | Admitting: Urology

## 2018-09-02 NOTE — Telephone Encounter (Signed)
Certified letter sent 09/02/2018

## 2018-09-29 ENCOUNTER — Encounter: Payer: Self-pay | Admitting: *Deleted

## 2018-10-17 ENCOUNTER — Encounter: Payer: Self-pay | Admitting: Family Medicine

## 2018-12-29 ENCOUNTER — Telehealth: Payer: Self-pay | Admitting: Family Medicine

## 2018-12-29 NOTE — Chronic Care Management (AMB) (Signed)
°  Chronic Care Management   Outreach Note  12/29/2018 Name: Jonathan Greer MRN: [id number] DOB: [birth year]  Referred by: Birdie Sons, MD Reason for referral : Chronic Care Management (Initial CCM outreach was unsuccessful. )   An unsuccessful telephone outreach was attempted today. The patient was referred to the case management team by for assistance with chronic care management and care coordination.   Follow Up Plan: The care management team will reach out to the patient again over the next 7 days.   [place]  [email or web address]   ??[phone]

## 2018-12-31 ENCOUNTER — Encounter: Payer: Self-pay | Admitting: Family Medicine

## 2019-01-07 NOTE — Chronic Care Management (AMB) (Signed)
°  Chronic Care Management   Outreach Note  01/07/2019 Name: AHAD COLARUSSO MRN: [id number] DOB: [birth year]  Referred by: Birdie Sons, MD Reason for referral : Chronic Care Management (Initial CCM outreach was unsuccessful. )   A second unsuccessful telephone outreach was attempted today. The patient was referred to the case management team for assistance with chronic care management and care coordination.   Follow Up Plan: The care management team will reach out to the patient again over the next 7 days.   [place]  [email or web address]   ??[phone]

## 2019-01-14 NOTE — Chronic Care Management (AMB) (Signed)
°  Chronic Care Management   Outreach Note  01/14/2019 Name: Jonathan Greer MRN: [id number] DOB: [birth year]  Referred by: Birdie Sons, MD Reason for referral : Chronic Care Management (Initial CCM outreach was unsuccessful. ), Chronic Care Management (Second CCM outreach was unsuccessful. ), and Chronic Care Management (Third CCM outreach was unsuccessful. )   Third unsuccessful telephone outreach was attempted today. The patient was referred to the case management team for assistance with chronic care management and care coordination. The patient's primary care provider has been notified of our unsuccessful attempts to make or maintain contact with the patient. The care management team is pleased to engage with this patient at any time in the future should he/she be interested in assistance from the care management team.   Follow Up Plan: The care management team is available to follow up with the patient after provider conversation with the patient regarding recommendation for care management engagement and subsequent re-referral to the care management team.   [place]  [email or web address]   ??[phone]

## 2019-03-10 ENCOUNTER — Telehealth: Payer: Self-pay

## 2019-03-10 NOTE — Telephone Encounter (Signed)
[id number] 

## 2019-03-10 NOTE — Telephone Encounter (Signed)
OK, will discuss at o.v. on 10-2. In the meantime. He probably has diabetes and needs to avoid all sweets and starchy foods.

## 2019-03-10 NOTE — Telephone Encounter (Signed)
Jonathan Greer, [place] with [employer] called to let us know that a A1c was done for patient and that it was 7.0. Patient was given a copy of result and was advised to call the office.

## 2019-03-20 ENCOUNTER — Ambulatory Visit
Admission: RE | Admit: 2019-03-20 | Discharge: 2019-03-20 | Disposition: A | Discharge status: Home / Self Care | Payer: Medicare Other | Attending: Family Medicine | Admitting: Family Medicine

## 2019-03-20 ENCOUNTER — Other Ambulatory Visit: Payer: Self-pay

## 2019-03-20 ENCOUNTER — Encounter: Payer: Self-pay | Admitting: Family Medicine

## 2019-03-20 ENCOUNTER — Ambulatory Visit
Admission: RE | Admit: 2019-03-20 | Discharge: 2019-03-20 | Disposition: A | Discharge status: Home / Self Care | Payer: Medicare Other | Source: Ambulatory Visit | Attending: Family Medicine | Admitting: Family Medicine

## 2019-03-20 ENCOUNTER — Ambulatory Visit (INDEPENDENT_AMBULATORY_CARE_PROVIDER_SITE_OTHER): Payer: Medicare Other | Admitting: Family Medicine

## 2019-03-20 VITALS — BP 120/70 | HR 87 | Temp 97.5°F | Resp 16 | Ht 69.0 in | Wt 244.0 lb

## 2019-03-20 DIAGNOSIS — R739 Hyperglycemia, unspecified: Secondary | ICD-10-CM

## 2019-03-20 DIAGNOSIS — E785 Hyperlipidemia, unspecified: Secondary | ICD-10-CM | POA: Diagnosis not present

## 2019-03-20 DIAGNOSIS — M5410 Radiculopathy, site unspecified: Secondary | ICD-10-CM

## 2019-03-20 DIAGNOSIS — Z0000 Encounter for general adult medical examination without abnormal findings: Secondary | ICD-10-CM | POA: Diagnosis not present

## 2019-03-20 DIAGNOSIS — Z125 Encounter for screening for malignant neoplasm of prostate: Secondary | ICD-10-CM

## 2019-03-20 DIAGNOSIS — Z23 Encounter for immunization: Secondary | ICD-10-CM

## 2019-03-20 DIAGNOSIS — M5136 Other intervertebral disc degeneration, lumbar region: Secondary | ICD-10-CM | POA: Diagnosis not present

## 2019-03-20 DIAGNOSIS — B356 Tinea cruris: Secondary | ICD-10-CM

## 2019-03-20 MED ORDER — KETOCONAZOLE 2 % EX CREA: 1.0000 "application " | TOPICAL_CREAM | Freq: Two times a day (BID) | CUTANEOUS | 0 refills | Status: DC

## 2019-03-20 NOTE — Patient Instructions (Addendum)
.   Please review the attached list of medications and notify my office if there are any errors.   . Please bring all of your medications to every appointment so we can make sure that our medication list is the same as yours.   

## 2019-03-20 NOTE — Progress Notes (Signed)
Patient: Jonathan Greer, Male    DOB: 02-Jul-1945, 73 y.o.   MRN: [id number] Visit Date: 03/20/2019  Today's Provider: Lelon Huh, MD   Chief Complaint  Patient presents with  . Annual Exam   Subjective:     Complete Physical Jonathan Greer is a 73 y.o. male. He feels fairly well. He reports no regular  exercising. He reports he is sleeping fairly well. Patient recently had a visit with a [facility] nurse on 03/09/2019 for his annual home visit, and was told that his HgbA1C was 7.0.   -----------------------------------------------------------  Lipid/Cholesterol, Follow-up:   Last seen for this 1 years ago.  Management changes since that visit include none; diet controlled. . Last Lipid Panel:    Component Value Date/Time   CHOL 223 (H) 07/19/2017 1027   TRIG 126 07/19/2017 1027   HDL 67 07/19/2017 1027   CHOLHDL 3.3 07/19/2017 1027   LDLCALC 131 (H) 07/19/2017 1027    Risk factors for vascular disease include hypercholesterolemia  He reports fair compliance with treatment. He is not having side effects.  Current symptoms include none and have been stable. Weight trend: stable Prior visit with dietician: no Current diet: regular diet Current exercise: none  Wt Readings from Last 3 Encounters:  03/20/19 244 lb (110.7 kg)  08/28/18 243 lb (110.2 kg)  02/24/18 239 lb (108.4 kg)    ------------------------------------------------------------------- Complains that for the last 10 years has pain down lateral aspect of left leg when standing for prolonged periods. No back pain.    Review of Systems  Constitutional: Negative for appetite change, chills, fatigue and fever.  HENT: Negative for congestion, ear pain, hearing loss, nosebleeds and trouble swallowing.   Eyes: Negative for pain and visual disturbance.  Respiratory: Negative for cough, chest tightness and shortness of breath.   Cardiovascular: Negative for chest pain, palpitations and leg swelling.   Gastrointestinal: Negative for abdominal pain, blood in stool, constipation, diarrhea, nausea and vomiting.  Endocrine: Negative for polydipsia, polyphagia and polyuria.  Genitourinary: Negative for dysuria and flank pain.  Musculoskeletal: Negative for arthralgias, back pain, joint swelling, myalgias and neck stiffness.  Skin: Negative for color change, rash and wound.  Neurological: Negative for dizziness, tremors, seizures, speech difficulty, weakness, light-headedness and headaches.  Psychiatric/Behavioral: Negative for behavioral problems, confusion, decreased concentration, dysphoric mood and sleep disturbance. The patient is not nervous/anxious.   All other systems reviewed and are negative.   Social History   Socioeconomic History  . Marital status: Married    Spouse name: Not on file  . Number of children: 0  . Years of education: Not on file  . Highest education level: 10th grade  Occupational History  . 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 Financial resource strain: Not hard at all  . Food insecurity    Worry: Never true    Inability: Never true  . Transportation needs    Medical: No    Non-medical: No  Tobacco Use  . Smoking status: Former Smoker    Packs/day: 2.00    Years: 20.00    Pack years: 40.00    Types: Cigarettes    Quit date: 06/19/1975    Years since quitting: 43.7  . Smokeless tobacco: Former Systems developer    Types: Chew  . Tobacco comment: Quit in the 65s  Substance and Sexual Activity  . Alcohol use: No  Alcohol/week: 0.0 standard drinks  . Drug use: No  . Sexual activity: Not on file  Lifestyle  . Physical activity    Days per week: Not on file    Minutes per session: Not on file  . Stress: Not at all  Relationships  . Social Herbalist on phone: Patient refused    Gets together: Patient refused    Attends religious service: Patient refused    Active member of  club or organization: Patient refused    Attends meetings of clubs or organizations: Patient refused    Relationship status: Patient refused  . Intimate partner violence    Fear of current or ex partner: Patient refused    Emotionally abused: Patient refused    Physically abused: Patient refused    Forced sexual activity: Patient refused  Other Topics Concern  . Not on file  Social History Narrative   Working 5 days a week in [employer] (06/2016)    Past Medical History:  Diagnosis Date  . Basal cell carcinoma    nose  . Hyperlipemia 07/07/2015  . Hyperlipidemia   . Shingles 07/07/2015  . Wears dentures      Patient Active Problem List   Diagnosis Date Noted  . Gross hematuria 02/24/2018  . Complex renal cyst 02/24/2018  . Bimalleolar fracture of left ankle 06/25/2017  . Diverticulosis of large intestine without diverticulitis   . First degree hemorrhoids   . Benign neoplasm of transverse colon   . Hx of colonic polyps   . Benign neoplasm of ascending colon   . History of basal cell carcinoma of skin 01/17/2016  . 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 Hyperlipemia 07/07/2015  . 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 Kidney stone 07/07/2015  . Polyp of sigmoid colon 07/07/2015    Past Surgical History:  Procedure Laterality Date  . CATARACT EXTRACTION W/PHACO Left 03/13/2017   Procedure: CATARACT EXTRACTION PHACO AND INTRAOCULAR LENS PLACEMENT ([place]) LEFT;  Surgeon: Leandrew Koyanagi, MD;  Location: [place];  Service: Ophthalmology;  Laterality: Left;  IVA TOPICAL LEFT  . CATARACT EXTRACTION W/PHACO Right 04/17/2017   Procedure: CATARACT EXTRACTION PHACO AND INTRAOCULAR LENS PLACEMENT ([facility]);  Surgeon: Leandrew Koyanagi, MD;  Location: [place];  Service: Ophthalmology;  Laterality: Right;  IVA TOPICAL RIGHT  . COLONOSCOPY WITH PROPOFOL N/A 07/27/2016   Procedure: COLONOSCOPY WITH PROPOFOL;  Surgeon: [name], MD;  Location: [facility]  ENDOSCOPY;  Service: Endoscopy;  Laterality: N/A;  . HERNIA REPAIR    . MOHS SURGERY  01/10/2016   [facility] for [facility]  . TONSILLECTOMY AND ADENOIDECTOMY  22's    His family history includes CVA in his father; Cancer in his paternal grandfather and paternal grandmother; Heart failure in his mother; Liver cancer in his maternal grandmother; Lung cancer in his brother. There is no history of Bladder Cancer, Prostate cancer, or Kidney cancer.   Current Outpatient Medications:  .  tamsulosin (FLOMAX) 0.4 MG CAPS capsule, Take 1 capsule (0.4 mg total) by mouth daily. (Patient not taking: Reported on 03/20/2019), Disp: 30 capsule, Rfl: 0  Patient Care Team: Birdie Sons, MD as PCP - General (Family Medicine) Pa, [place] as Consulting Physician (Optometry) [name], MD as Consulting Physician (Gastroenterology) Lacinda Axon Tedra Coupe, MD as Referring Physician (Dermatology)     Objective:    Vitals: BP 120/70 (BP Location: Left Arm, Patient Position: Sitting, Cuff Size: Large)   Pulse 87   Temp (!) 97.5 F (36.4  C) (Temporal)   Resp 16   Ht 5\' 9"  (1.753 m)   Wt 244 lb (110.7 kg)   SpO2 97% Comment: room air  BMI 36.03 kg/m   Physical Exam   General Appearance:    Obese male. Alert, cooperative, in no acute distress, appears stated age  Head:    Normocephalic, without obvious abnormality, atraumatic  Eyes:    PERRL, conjunctiva/corneas clear, EOM's intact, fundi    benign, both eyes       Ears:    Normal TM's and external ear canals, both ears  Nose:   Nares normal, septum midline, mucosa normal, no drainage   or sinus tenderness  Throat:   Lips, mucosa, and tongue normal; teeth and gums normal  Neck:   Supple, symmetrical, trachea midline, no adenopathy;       thyroid:  No enlargement/tenderness/nodules; no carotid   bruit or JVD  Back:     Symmetric, no curvature, ROM normal, no CVA tenderness  Lungs:     Clear to auscultation bilaterally, respirations unlabored   Chest wall:    No tenderness or deformity  Heart:    Normal heart rate. Normal rhythm. No murmurs, rubs, or gallops.  S1 and S2 normal  Abdomen:     Soft, non-tender, bowel sounds active all four quadrants,    no masses, no organomegaly  Genitalia:    deferred  Rectal:    deferred  Extremities:   All extremities are intact. No cyanosis or edema. No spine tenderness. No leg tenderness.   Pulses:   2+ and symmetric all extremities  Skin:   Skin color, texture, turgor normal, no rashes or lesions  Lymph nodes:   Cervical, supraclavicular, and axillary nodes normal  Neurologic:   CNII-XII intact. Normal strength, sensation and reflexes      throughout    Activities of Daily Living In your present state of health, do you have any difficulty performing the following activities: 08/28/2018  Hearing? Y  Comment Does not wear hearing aids.   Vision? N  Difficulty concentrating or making decisions? N  Walking or climbing stairs? N  Dressing or bathing? N  Doing errands, shopping? N  Preparing Food and eating ? N  Using the Toilet? N  In the past six months, have you accidently leaked urine? N  Do you have problems with loss of bowel control? N  Managing your Medications? N  Managing your Finances? N  Housekeeping or managing your Housekeeping? N  Some recent data might be hidden    Fall Risk Assessment Fall Risk  08/28/2018 07/19/2017 06/27/2016  Falls in the past year? 0 Yes No  Number falls in past yr: - 1 -  Comment - fell off later -  Injury with Fall? - Yes -  Comment - broken left ankle -  Follow up - Falls prevention discussed -     Depression Screen PHQ 2/9 Scores 08/28/2018 08/28/2018 07/19/2017 07/19/2017  PHQ - 2 Score 0 0 0 0  PHQ- 9 Score 0 - 0 -    6CIT Screen 08/28/2018  What Year? 0 points  What month? 0 points  What time? 0 points  Count back from 20 0 points  Months in reverse 0 points  Repeat phrase 0 points  Total Score 0       Assessment & Plan:     Annual Physical Reviewed patient's Family Medical History Reviewed and updated list of patient's medical providers Assessment of cognitive impairment was done Assessed  patient's functional ability Established a written schedule for health screening [place] Completed and Reviewed  Exercise Activities and Dietary recommendations Goals    . Diet      Recommend to continue with current diet plan of cutting out all foods that are white (focusing on carbs and sugars).     Marland Kitchen DIET - INCREASE WATER INTAKE     Recommend to drink at least 6-8 8oz glasses of water per day.       Immunization History  Administered Date(s) Administered  . Influenza, High Dose Seasonal PF 06/27/2016, 04/26/2017, 05/02/2018  . Pneumococcal Conjugate-13 10/07/2014  . Pneumococcal Polysaccharide-23 03/18/2013  . Tdap 06/30/2010, 01/15/2017    Health Maintenance  Topic Date Due  . COLONOSCOPY  07/27/2017  . INFLUENZA VACCINE  01/17/2019  . TETANUS/TDAP  01/16/2027  . Hepatitis C Screening  Completed  . PNA vac Low Risk Adult  Completed     Discussed health benefits of physical activity, and encouraged him to engage in regular exercise appropriate for his age and condition.    ------------------------------------------------------------------------------------------------------------  1. Annual physical exam Counseled regarding prudent diet and regular exercise.    2. Hyperlipidemia, unspecified hyperlipidemia type Diet controlled.  - Comprehensive metabolic panel - Lipid panel  3. Need for influenza vaccination  - Flu Vaccine QUAD High Dose(Fluad)   4. Hyperglycemia Recent home a1c was 7.0   - Hemoglobin A1c  5. Radiculopathy, unspecified spinal region  - DG Lumbar Spine Complete; Future   6. Prostate cancer screening  - PSA   7 tINEA CRURIS He reports frequent itching in inguinal area partially relieved by OTC HC 1%, but never clears completely.  -  ketoconazole (NIZORAL) 2 % cream; Apply 1 application topically 2 (two) times daily.  Dispense: 15 g; Refill: 0   Lelon Huh, MD  [facility]

## 2019-03-21 LAB — PSA: Prostate Specific Ag, Serum: 0.5 ng/mL (ref 0.0–4.0)

## 2019-03-21 LAB — COMPREHENSIVE METABOLIC PANEL
ALT: 59 IU/L — ABNORMAL HIGH (ref 0–44)
AST: 53 IU/L — ABNORMAL HIGH (ref 0–40)
Albumin/Globulin Ratio: 1.5 (ref 1.2–2.2)
Albumin: 4.6 g/dL (ref 3.7–4.7)
Alkaline Phosphatase: 52 IU/L (ref 39–117)
BUN/Creatinine Ratio: 14 (ref 10–24)
BUN: 17 mg/dL (ref 8–27)
Bilirubin Total: 1 mg/dL (ref 0.0–1.2)
CO2: 22 mmol/L (ref 20–29)
Calcium: 10.1 mg/dL (ref 8.6–10.2)
Chloride: 105 mmol/L (ref 96–106)
Creatinine, Ser: 1.19 mg/dL (ref 0.76–1.27)
GFR calc Af Amer: 70 mL/min/{1.73_m2} (ref >59)
GFR calc non Af Amer: 60 mL/min/{1.73_m2} (ref >59)
Globulin, Total: 3 g/dL (ref 1.5–4.5)
Glucose: 85 mg/dL (ref 65–99)
Potassium: 4.3 mmol/L (ref 3.5–5.2)
Sodium: 141 mmol/L (ref 134–144)
Total Protein: 7.6 g/dL (ref 6.0–8.5)

## 2019-03-21 LAB — LIPID PANEL
Chol/HDL Ratio: 3.2 ratio (ref 0.0–5.0)
Cholesterol, Total: 201 mg/dL — ABNORMAL HIGH (ref 100–199)
HDL: 62 mg/dL (ref >39)
LDL Chol Calc (NIH): 126 mg/dL — ABNORMAL HIGH (ref 0–99)
Triglycerides: 69 mg/dL (ref 0–149)
VLDL Cholesterol Cal: 13 mg/dL (ref 5–40)

## 2019-03-21 LAB — HEMOGLOBIN A1C
Est. average glucose Bld gHb Est-mCnc: 128 mg/dL
Hgb A1c MFr Bld: 6.1 % — ABNORMAL HIGH (ref 4.8–5.6)

## 2019-03-22 ENCOUNTER — Encounter: Payer: Self-pay | Admitting: Family Medicine

## 2019-03-22 DIAGNOSIS — K760 Fatty (change of) liver, not elsewhere classified: Secondary | ICD-10-CM | POA: Insufficient documentation

## 2019-03-24 ENCOUNTER — Telehealth: Payer: Self-pay

## 2019-03-24 NOTE — Telephone Encounter (Signed)
-----   Message from Birdie Sons, MD sent at 03/22/2019  9:13 AM EDT ----- Also, need to schedule follow up in about 5 months to check a1c and liver functions.

## 2019-03-24 NOTE — Telephone Encounter (Signed)
Patient has been advised and will give [place] a call back to let us know if he will see Dr. Sharlet Salina. KW

## 2019-03-24 NOTE — Telephone Encounter (Signed)
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 Not any cure for this. Sometimes epidural injections help. If it is very bothersome to him we can refer to Dr. Sharlet Salina

## 2019-03-24 NOTE — Telephone Encounter (Signed)
[id number]  Notes recorded by Birdie Sons, MD on 03/22/2019 at 9:11 AM EDT  Sugar is borderline for diabetes. Liver functions are mildly elevated. Need to strictly avoid all sweets and white starchy foods. Need to avoid or at least limit alcohol to no more than 2 servings in a day

## 2019-03-27 NOTE — Telephone Encounter (Signed)
Advised patient's wife of results. Appt was scheduled.

## 2019-08-25 NOTE — Progress Notes (Signed)
Patient: Jonathan Greer Male    DOB: [birth year]   74 y.o.   MRN: [id number] Visit Date: 08/26/2019  Today's Provider: Lelon Huh, MD   Chief Complaint  Patient presents with  . Hyperglycemia   Subjective:     HPI  Hyperglycemia, Follow-up:   Lab Results  Component Value Date   HGBA1C 6.1 (H) 03/20/2019   GLUCOSE 85 03/20/2019   GLUCOSE 82 07/19/2017   GLUCOSE 111 (H) 06/28/2017    Last seen for for this 5 months ago.  Management since then includes advising patient to strictly avoid all sweets and white starchy foods. Current symptoms include none and have been stable.  Weight trend: increasing steadily Prior visit with dietician: no Current diet: well balanced Current exercise: none  Pertinent Labs:    Component Value Date/Time   CHOL 201 (H) 03/20/2019 1450   TRIG 69 03/20/2019 1450   CHOLHDL 3.2 03/20/2019 1450   CREATININE 1.19 03/20/2019 1450    Wt Readings from Last 3 Encounters:  08/26/19 260 lb (117.9 kg)  03/20/19 244 lb (110.7 kg)  08/28/18 243 lb (110.2 kg)    Follow up for Elevated liver Functions:  The patient was last seen for this 5 months ago. Changes made at last visit include counseling patient to avoid or at least limit alcohol to no more than 2 servings in a day.  He reports good compliance with treatment. He feels that condition is Unchanged. He is not having side effects.   ------------------------------------------------------------------------------------  No Known Allergies   Current Outpatient Medications:  .  tamsulosin (FLOMAX) 0.4 MG CAPS capsule, Take 1 capsule (0.4 mg total) by mouth daily. (Patient not taking: Reported on 08/26/2019), Disp: 30 capsule, Rfl: 0  Review of Systems  Constitutional: Negative for appetite change, chills and fever.  Respiratory: Negative for chest tightness, shortness of breath and wheezing.   Cardiovascular: Negative for chest pain and palpitations.  Gastrointestinal:  Negative for abdominal pain, nausea and vomiting.    Social History   Tobacco Use  . Smoking status: Former Smoker    Packs/day: 2.00    Years: 20.00    Pack years: 40.00    Types: Cigarettes    Quit date: 06/19/1975    Years since quitting: 44.2  . Smokeless tobacco: Former Systems developer    Types: Chew  . Tobacco comment: Quit in the 70s  Substance Use Topics  . Alcohol use: No    Alcohol/week: 0.0 standard drinks      Objective:   BP (!) 146/80 (Cuff Size: Large)   Pulse 60   Temp (!) 96.9 F (36.1 C) (Temporal)   Resp 16   Wt 260 lb (117.9 kg)   SpO2 97% Comment: room air  BMI 38.40 kg/m  Vitals:   08/26/19 0810 08/26/19 0812  BP: (!) 144/90 (!) 146/80  Pulse: 60   Resp: 16   Temp: (!) 96.9 F (36.1 C)   TempSrc: Temporal   SpO2: 97%   Weight: 260 lb (117.9 kg)   Body mass index is 38.4 kg/m.   Physical Exam   General: Appearance:    Obese male in no acute distress  Eyes:    PERRL, conjunctiva/corneas clear, EOM's intact       Lungs:     Clear to auscultation bilaterally, respirations unlabored  Heart:    Normal heart rate. Normal rhythm. No murmurs, rubs, or gallops.   MS:   All extremities are intact.   Neurologic:  Awake, alert, oriented x 3. No apparent focal neurological           defect.        Results for orders placed or performed in visit on 08/26/19  POCT HgB A1C  Result Value Ref Range   Hemoglobin A1C 6.3 (A) 4.0 - 5.6 %   Est. average glucose Bld gHb Est-mCnc 134        Assessment & Plan    1. Prediabetes Stable, controlled with diet which he report improving.   2. Elevated transaminase level Likely secondary to hepatic steatosis.  - Comprehensive metabolic panel - CBC  3. Hepatic steatosis   4. Morbid obesity ([place]) Working on diet and expecting to increase exercise in the coming months.   5. Hx of colonic polyps Overdue for follow up colonoscopy  - Ambulatory referral to gastroenterology for colonoscopy  6. Colon cancer  screening  - Ambulatory referral to gastroenterology for colonoscopy  Future Appointments  Date Time Provider Jonathan  08/31/2019  8:40 AM [facility]-NURSE HEALTH ADVISOR [facility]      The entirety of the information documented in the History of Present Illness, Review of Systems and Physical Exam were personally obtained by me. Portions of this information were initially documented by Meyer Cory, CMA and reviewed by me for thoroughness and accuracy.    Lelon Huh, MD  [facility]

## 2019-08-25 NOTE — Patient Instructions (Signed)
.   Please review the attached list of medications and notify my office if there are any errors.   . Please bring all of your medications to every appointment so we can make sure that our medication list is the same as yours.   

## 2019-08-26 ENCOUNTER — Telehealth: Payer: Self-pay

## 2019-08-26 ENCOUNTER — Ambulatory Visit (INDEPENDENT_AMBULATORY_CARE_PROVIDER_SITE_OTHER): Payer: Medicare Other | Admitting: Family Medicine

## 2019-08-26 ENCOUNTER — Other Ambulatory Visit: Payer: Self-pay

## 2019-08-26 ENCOUNTER — Encounter: Payer: Self-pay | Admitting: Family Medicine

## 2019-08-26 VITALS — BP 146/80 | HR 60 | Temp 96.9°F | Resp 16 | Wt 260.0 lb

## 2019-08-26 DIAGNOSIS — Z860100 Personal history of colon polyps, unspecified: Secondary | ICD-10-CM

## 2019-08-26 DIAGNOSIS — R7401 Elevation of levels of liver transaminase levels: Secondary | ICD-10-CM | POA: Diagnosis not present

## 2019-08-26 DIAGNOSIS — R7303 Prediabetes: Secondary | ICD-10-CM | POA: Diagnosis not present

## 2019-08-26 DIAGNOSIS — Z1211 Encounter for screening for malignant neoplasm of colon: Secondary | ICD-10-CM

## 2019-08-26 DIAGNOSIS — K760 Fatty (change of) liver, not elsewhere classified: Secondary | ICD-10-CM | POA: Diagnosis not present

## 2019-08-26 LAB — POCT GLYCOSYLATED HEMOGLOBIN (HGB A1C)
Est. average glucose Bld gHb Est-mCnc: 134
Hemoglobin A1C: 6.3 % — AB (ref 4.0–5.6)

## 2019-08-26 NOTE — Telephone Encounter (Signed)
Gastroenterology Pre-Procedure Review  Request Date: Monday 09/14/19 Requesting Physician: Dr. Vicente Males  PATIENT REVIEW QUESTIONS: The patient responded to the following health history questions as indicated:    1. Are you having any GI issues? no 2. Do you have a personal history of Polyps? no 3. Do you have a family history of Colon Cancer or Polyps? no 4. Diabetes Mellitus? no 5. Joint replacements in the past 12 months?no 6. Major health problems in the past 3 months?no 7. Any artificial heart valves, MVP, or defibrillator?no    MEDICATIONS & ALLERGIES:    Patient reports the following regarding taking any anticoagulation/antiplatelet therapy:   Plavix, Coumadin, Eliquis, Xarelto, Lovenox, Pradaxa, Brilinta, or Effient? no Aspirin? no  Patient confirms/reports the following medications:  Current Outpatient Medications  Medication Sig Dispense Refill  . tamsulosin (FLOMAX) 0.4 MG CAPS capsule Take 1 capsule (0.4 mg total) by mouth daily. (Patient not taking: Reported on 08/26/2019) 30 capsule 0   No current facility-administered medications for this visit.    Patient confirms/reports the following allergies:  No Known Allergies  No orders of the defined types were placed in this encounter.   AUTHORIZATION INFORMATION Primary Insurance: 1D#: Group #:  Secondary Insurance: 1D#: Group #:  SCHEDULE INFORMATION: Date: Monday 09/14/19 Time: Location:[facility]

## 2019-08-27 ENCOUNTER — Telehealth: Payer: Self-pay

## 2019-08-27 LAB — COMPREHENSIVE METABOLIC PANEL
ALT: 55 IU/L — ABNORMAL HIGH (ref 0–44)
AST: 40 IU/L (ref 0–40)
Albumin/Globulin Ratio: 1.6 (ref 1.2–2.2)
Albumin: 4.7 g/dL (ref 3.7–4.7)
Alkaline Phosphatase: 54 IU/L (ref 39–117)
BUN/Creatinine Ratio: 13 (ref 10–24)
BUN: 16 mg/dL (ref 8–27)
Bilirubin Total: 0.8 mg/dL (ref 0.0–1.2)
CO2: 21 mmol/L (ref 20–29)
Calcium: 10.2 mg/dL (ref 8.6–10.2)
Chloride: 102 mmol/L (ref 96–106)
Creatinine, Ser: 1.19 mg/dL (ref 0.76–1.27)
GFR calc Af Amer: 70 mL/min/{1.73_m2} (ref >59)
GFR calc non Af Amer: 60 mL/min/{1.73_m2} (ref >59)
Globulin, Total: 2.9 g/dL (ref 1.5–4.5)
Glucose: 107 mg/dL — ABNORMAL HIGH (ref 65–99)
Potassium: 4.6 mmol/L (ref 3.5–5.2)
Sodium: 140 mmol/L (ref 134–144)
Total Protein: 7.6 g/dL (ref 6.0–8.5)

## 2019-08-27 LAB — CBC
Hematocrit: 46.5 % (ref 37.5–51.0)
Hemoglobin: 15.9 g/dL (ref 13.0–17.7)
MCH: 30.8 pg (ref 26.6–33.0)
MCHC: 34.2 g/dL (ref 31.5–35.7)
MCV: 90 fL (ref 79–97)
Platelets: 191 10*3/uL (ref 150–450)
RBC: 5.16 x10E6/uL (ref 4.14–5.80)
RDW: 13.2 % (ref 11.6–15.4)
WBC: 6 10*3/uL (ref 3.4–10.8)

## 2019-08-27 NOTE — Telephone Encounter (Signed)
Reviewed physician's note with the patient and his wife. Scheduled follow up for elevated A1c for 09/11/19@8 :40a.  Will need other follow up scheduled at appointment listed above.

## 2019-08-27 NOTE — Progress Notes (Signed)
Subjective:   Jonathan Greer is a 74 y.o. male who presents for Medicare Annual/Subsequent preventive examination.    This visit is being conducted through telemedicine due to the COVID-19 pandemic. This patient has given me verbal consent via doximity to conduct this visit, patient states they are participating from their home address. Some vital signs may be absent or patient reported.    Patient identification: identified by name, DOB, and current address  Review of Systems:  N/A  Cardiac Risk Factors include: advanced age (>7men, >57 women);dyslipidemia;male gender     Objective:    Vitals: There were no vitals taken for this visit.  There is no height or weight on file to calculate BMI. Unable to obtain vitals due to visit being conducted via telephonically.   Advanced Directives 08/31/2019 08/28/2018 01/03/2018 07/19/2017 06/28/2017 06/24/2017 04/17/2017  Does Patient Have a Medical Advance Directive? No Yes No No No No No  Type of Advance Directive - Healthcare Power of [place];Living will - - - - -  Copy of [place] in Chart? - No - copy requested - - - - -  Would patient like information on creating a medical advance directive? No - Patient declined - - Yes (MAU/Ambulatory/Procedural Areas - Information given) No - Patient declined No - Patient declined No - Patient declined    Tobacco Social History   Tobacco Use  Smoking Status Former Smoker  . Packs/day: 2.00  . Years: 20.00  . Pack years: 40.00  . Types: Cigarettes  . Quit date: 06/19/1975  . Years since quitting: 44.2  Smokeless Tobacco Former Systems developer  . Types: Chew  Tobacco Comment   Quit in the 27s     Counseling given: Not Answered Comment: Quit in the 70s   Clinical Intake:  Pre-visit preparation completed: Yes  Pain : No/denies pain Pain Score: 0-No pain     Nutritional Risks: None Diabetes: No  How often do you need to have someone help you when you read instructions,  pamphlets, or other written materials from your doctor or pharmacy?: 1 - Never  Interpreter Needed?: No  Information entered by :: [employer], LPN  Past Medical History:  Diagnosis Date  . Basal cell carcinoma    nose  . Hyperlipemia 07/07/2015  . Hyperlipidemia   . Shingles 07/07/2015  . Wears dentures    Past Surgical History:  Procedure Laterality Date  . CATARACT EXTRACTION W/PHACO Left 03/13/2017   Procedure: CATARACT EXTRACTION PHACO AND INTRAOCULAR LENS PLACEMENT ([place]) LEFT;  Surgeon: Leandrew Koyanagi, MD;  Location: [facility];  Service: Ophthalmology;  Laterality: Left;  IVA TOPICAL LEFT  . CATARACT EXTRACTION W/PHACO Right 04/17/2017   Procedure: CATARACT EXTRACTION PHACO AND INTRAOCULAR LENS PLACEMENT ([facility]);  Surgeon: Leandrew Koyanagi, MD;  Location: [facility];  Service: Ophthalmology;  Laterality: Right;  IVA TOPICAL RIGHT  . COLONOSCOPY WITH PROPOFOL N/A 07/27/2016   Procedure: COLONOSCOPY WITH PROPOFOL;  Surgeon: [name], MD;  Location: [facility] END[facility]COPY;  Service: Endoscopy;  Laterality: N/A;  . HERNIA REPAIR    . MOHS SURGERY  01/10/2016   [facility]  . TONSILLECTOMY AND ADENOIDECTOMY  1960's   Family History  Problem Relation Age of Onset  . Heart failure Mother   . CVA Father   . Lung cancer Brother   . Liver cancer Maternal Grandmother   . Cancer Paternal Grandmother   . Cancer Paternal Grandfather   . Bladder Cancer Neg Hx   . Prostate cancer Neg  Hx   . Kidney cancer Neg Hx    Social History   Socioeconomic History  . Marital status: Married    Spouse name: Not on file  . Number of children: 0  . Years of education: Not on file  . Highest education level: 10th grade  Occupational History  . Occupation: Retired  Tobacco Use  . Smoking status: Former Smoker    Packs/day: 2.00    Years: 20.00    Pack years: 40.00    Types: Cigarettes    Quit date: 06/19/1975    Years since quitting: 44.2  . Smokeless tobacco: Former  Systems developer    Types: Chew  . Tobacco comment: Quit in the 45s  Substance and Sexual Activity  . Alcohol use: No    Alcohol/week: 0.0 standard drinks  . Drug use: No  . Sexual activity: Not on file  Other Topics Concern  . Not on file  Social History Narrative   Working 5 days a week in [employer]. (06/2016)   Social Determinants of Health   Financial Resource Strain: Low Risk   . Difficulty of Paying Living Expenses: Not hard at all  Food Insecurity: No Food Insecurity  . Worried About Charity fundraiser in the Last Year: Never true  . Ran Out of Food in the Last Year: Never true  Transportation Needs: No Transportation Needs  . Lack of Transportation (Medical): No  . Lack of Transportation (Non-Medical): No  Physical Activity: Inactive  . Days of Exercise per Week: 0 days  . Minutes of Exercise per Session: 0 min  Stress: No Stress Concern Present  . Feeling of Stress : Not at all  Social Connections: Somewhat Isolated  . Frequency of Communication with Friends and Family: Once a week  . Frequency of Social Gatherings with Friends and Family: Once a week  . Attends Religious Services: More than 4 times per year  . Active Member of Clubs or Organizations: No  . Attends Archivist Meetings: Never  . Marital Status: Married    Outpatient Encounter Medications as of 08/31/2019  Medication Sig  . ibuprofen (ADVIL) 200 MG tablet Take 200 mg by mouth every 6 (six) hours as needed.  . tamsulosin (FLOMAX) 0.4 MG CAPS capsule Take 1 capsule (0.4 mg total) by mouth daily. (Patient not taking: Reported on 08/26/2019)   No facility-administered encounter medications on file as of 08/31/2019.    Activities of Daily Living In your present state of health, do you have any difficulty performing the following activities: 08/31/2019  Hearing? Y  Comment Does not wear hearing aids. Declines referral to audiologist.  Vision? N  Difficulty concentrating or making decisions? N  Walking  or climbing stairs? N  Dressing or bathing? N  Doing errands, shopping? N  Preparing Food and eating ? N  Using the Toilet? N  In the past six months, have you accidently leaked urine? N  Do you have problems with loss of bowel control? N  Managing your Medications? N  Managing your Finances? N  Housekeeping or managing your Housekeeping? N  Some recent data might be hidden    Patient Care Team: Birdie Sons, MD as PCP - General (Family Medicine) [place], [place] as Consulting Physician (Optometry) [name], MD as Consulting Physician (Gastroenterology) Tamsen Meek, MD as Referring Physician (Dermatology)   Assessment:   This is a routine wellness examination for B[place].  Exercise Activities and Dietary recommendations Current Exercise Habits: The patient  does not participate in regular exercise at present, Exercise limited by: None identified  Goals    . Diet      Recommend to continue with current diet plan of cutting out all foods that are white (focusing on carbs and sugars).     Marland Kitchen DIET - INCREASE WATER INTAKE     Recommend to drink at least 6-8 8oz glasses of water per day.       Fall Risk: Fall Risk  08/31/2019 08/26/2019 08/28/2018 07/19/2017 06/27/2016  Falls in the past year? 0 0 0 Yes No  Number falls in past yr: 0 0 - 1 -  Comment - - - fell off later -  Injury with Fall? 0 0 - Yes -  Comment - - - broken left ankle -  Follow up - Falls evaluation completed - Falls prevention discussed -    FALL RISK PREVENTION PERTAINING TO THE HOME:  Any stairs in or around the home? No  If so, are there any without handrails? N/A  Home free of loose throw rugs in walkways, pet beds, electrical cords, etc? Yes  Adequate lighting in your home to reduce risk of falls? Yes   ASSISTIVE DEVICES UTILIZED TO PREVENT FALLS:  Life alert? No  Use of a cane, walker or w/c? No  Grab bars in the bathroom? No  Shower chair or bench in shower? No  Elevated  toilet seat or a handicapped toilet? No   TIMED UP AND GO:  Was the test performed? No .    Depression Screen PHQ 2/9 Scores 08/31/2019 08/28/2018 08/28/2018 07/19/2017  PHQ - 2 Score 0 0 0 0  PHQ- 9 Score - 0 - 0    Cognitive Function: Declined today.      6CIT Screen 08/28/2018  What Year? 0 points  What month? 0 points  What time? 0 points  Count back from 20 0 points  Months in reverse 0 points  Repeat phrase 0 points  Total Score 0    Immunization History  Administered Date(s) Administered  . Fluad Quad(high Dose 65+) 03/20/2019  . Influenza, High Dose Seasonal PF 06/27/2016, 04/26/2017, 05/02/2018  . Pneumococcal Conjugate-13 10/07/2014  . Pneumococcal Polysaccharide-23 03/18/2013  . Tdap 06/30/2010, 01/15/2017    Qualifies for Shingles Vaccine? Yes . Due for Shingrix. Pt has been advised to call insurance company to determine out of pocket expense. Advised may also receive vaccine at local pharmacy or Health Dept. Verbalized acceptance and understanding.  Tdap: Up to date  Flu Vaccine: Up to date  Pneumococcal Vaccine: Completed series  Screening Tests Health Maintenance  Topic Date Due  . COLONOSCOPY  07/27/2017  . TETANUS/TDAP  01/16/2027  . INFLUENZA VACCINE  Completed  . Hepatitis C Screening  Completed  . PNA vac Low Risk Adult  Completed   Cancer Screenings:  Colorectal Screening: Completed 07/27/16. Repeat every year. Colonoscopy scheduled for 09/14/19.  Lung Cancer Screening: (Low Dose CT Chest recommended if Age 73-80 years, 30 pack-year currently smoking OR have quit w/in 15years.) does not qualify.   Additional Screening:  Hepatitis C Screening: Up to date  Vision Screening: Recommended annual ophthalmology exams for early detection of glaucoma and other disorders of the eye.  Dental Screening: Recommended annual dental exams for proper oral hygiene  Community Resource Referral:  CRR required this visit?  No        Plan:  I have  personally reviewed and addressed the Medicare Annual Wellness questionnaire and have noted the following  in the patient's chart:  A. Medical and social history B. Use of alcohol, tobacco or illicit drugs  C. Current medications and supplements D. Functional ability and status E.  Nutritional status F.  Physical activity G. Advance directives H. List of other physicians I.  Hospitalizations, surgeries, and ER visits in previous 12 months J.  [place] such as hearing and vision if needed, cognitive and depression L. Referrals and appointments   In addition, I have reviewed and discussed with patient certain preventive protocols, quality metrics, and best practice recommendations. A written personalized care plan for preventive services as well as general preventive health recommendations were provided to patient.   Glendora Score, LPN  [id number] Nurse Health Advisor   Nurse Notes: Colonoscopy is scheduled for 09/14/19.

## 2019-08-27 NOTE — Telephone Encounter (Signed)
I called patient back and rescheduled follow up appointment for 12/07/2019 at 8:20am.

## 2019-08-27 NOTE — Telephone Encounter (Signed)
LMTCB, okay for PEC to advise and schedule follow up appointment.

## 2019-08-27 NOTE — Telephone Encounter (Signed)
-----   Message from Birdie Sons, MD sent at 08/27/2019  7:43 AM EST ----- One liver function still elevated, but better. Otherwise labs normal. Continue current medications.  Schedule follow up for diabetes. Schedule follow up 3 months.

## 2019-08-31 ENCOUNTER — Other Ambulatory Visit: Payer: Self-pay

## 2019-08-31 ENCOUNTER — Ambulatory Visit (INDEPENDENT_AMBULATORY_CARE_PROVIDER_SITE_OTHER): Payer: Medicare Other

## 2019-08-31 DIAGNOSIS — Z0000 Encounter for general adult medical examination without abnormal findings: Secondary | ICD-10-CM | POA: Diagnosis not present

## 2019-08-31 NOTE — Patient Instructions (Signed)
Jonathan Greer , Thank you for taking time to come for your Medicare Wellness Visit. I appreciate your ongoing commitment to your health goals. Please review the following plan we discussed and let me know if I can assist you in the future.   Screening recommendations/referrals: Colonoscopy: Currently due. Scheduled for 09/14/19. Recommended yearly ophthalmology/optometry visit for glaucoma screening and checkup Recommended yearly dental visit for hygiene and checkup  Vaccinations: Influenza vaccine: Up to date Pneumococcal vaccine: Completed series Tdap vaccine: Up to date Shingles vaccine: Pt declines today.     Advanced directives: Advance directive discussed with you today. Even though you declined this today please call our office should you change your mind and we can give you the proper paperwork for you to fill out.  Conditions/risks identified: Recommend to continue to increase water intake a cut back on carbohydrates and sugars in diet.   Next appointment: 12/07/19 @ 8:20 AM with Dr Caryn Section  Preventive Care 34 Years and Older, Male Preventive care refers to lifestyle choices and visits with your health care provider that can promote health and wellness. What does preventive care include?  A yearly physical exam. This is also called an annual well check.  Dental exams once or twice a year.  Routine eye exams. Ask your health care provider how often you should have your eyes checked.  Personal lifestyle choices, including:  Daily care of your teeth and gums.  Regular physical activity.  Eating a healthy diet.  Avoiding tobacco and drug use.  Limiting alcohol use.  Practicing safe sex.  Taking low doses of aspirin every day.  Taking vitamin and mineral supplements as recommended by your health care provider. What happens during an annual well check? The services and screenings done by your health care provider during your annual well check will depend on your age,  overall health, lifestyle risk factors, and family history of disease. Counseling  Your health care provider may ask you questions about your:  Alcohol use.  Tobacco use.  Drug use.  Emotional well-being.  Home and relationship well-being.  Sexual activity.  Eating habits.  History of falls.  Memory and ability to understand (cognition).  Work and work Statistician. Screening  You may have the following tests or measurements:  Height, weight, and BMI.  Blood pressure.  Lipid and cholesterol levels. These may be checked every 5 years, or more frequently if you are over 6 years old.  Skin check.  Lung cancer screening. You may have this screening every year starting at age 87 if you have a 30-pack-year history of smoking and currently smoke or have quit within the past 15 years.  Fecal occult blood test (FOBT) of the stool. You may have this test every year starting at age 57.  Flexible sigmoidoscopy or colonoscopy. You may have a sigmoidoscopy every 5 years or a colonoscopy every 10 years starting at age 59.  Prostate cancer screening. Recommendations will vary depending on your family history and other risks.  Hepatitis C blood test.  Hepatitis B blood test.  Sexually transmitted disease (STD) testing.  Diabetes screening. This is done by checking your blood sugar (glucose) after you have not eaten for a while (fasting). You may have this done every 1-3 years.  Abdominal aortic aneurysm (AAA) screening. You may need this if you are a current or former smoker.  Osteoporosis. You may be screened starting at age 31 if you are at high risk. Talk with your health care provider about your test  results, treatment options, and if necessary, the need for more tests. Vaccines  Your health care provider may recommend certain vaccines, such as:  Influenza vaccine. This is recommended every year.  Tetanus, diphtheria, and acellular pertussis (Tdap, Td) vaccine. You may  need a Td booster every 10 years.  Zoster vaccine. You may need this after age 55.  Pneumococcal 13-valent conjugate (PCV13) vaccine. One dose is recommended after age 89.  Pneumococcal polysaccharide (PPSV23) vaccine. One dose is recommended after age 44. Talk to your health care provider about which screenings and vaccines you need and how often you need them. This information is not intended to replace advice given to you by your health care provider. Make sure you discuss any questions you have with your health care provider. Document Released: 07/01/2015 Document Revised: 02/22/2016 Document Reviewed: 04/05/2015 Elsevier Interactive Patient Education  2017 [place] Prevention in the Home Falls can cause injuries. They can happen to people of all ages. There are many things you can do to make your home safe and to help prevent falls. What can I do on the outside of my home?  Regularly fix the edges of walkways and driveways and fix any cracks.  Remove anything that might make you trip as you walk through a door, such as a raised step or threshold.  Trim any bushes or trees on the path to your home.  Use bright outdoor lighting.  Clear any walking paths of anything that might make someone trip, such as rocks or tools.  Regularly check to see if handrails are loose or broken. Make sure that both sides of any steps have handrails.  Any raised decks and porches should have guardrails on the edges.  Have any leaves, snow, or ice cleared regularly.  Use sand or salt on walking paths during winter.  Clean up any spills in your garage right away. This includes oil or grease spills. What can I do in the bathroom?  Use night lights.  Install grab bars by the toilet and in the tub and shower. Do not use towel bars as grab bars.  Use non-skid mats or decals in the tub or shower.  If you need to sit down in the shower, use a plastic, non-slip stool.  Keep the floor  dry. Clean up any water that spills on the floor as soon as it happens.  Remove soap buildup in the tub or shower regularly.  Attach bath mats securely with double-sided non-slip rug tape.  Do not have throw rugs and other things on the floor that can make you trip. What can I do in the bedroom?  Use night lights.  Make sure that you have a light by your bed that is easy to reach.  Do not use any sheets or blankets that are too big for your bed. They should not hang down onto the floor.  Have a firm chair that has side arms. You can use this for support while you get dressed.  Do not have throw rugs and other things on the floor that can make you trip. What can I do in the kitchen?  Clean up any spills right away.  Avoid walking on wet floors.  Keep items that you use a lot in easy-to-reach places.  If you need to reach something above you, use a strong step stool that has a grab bar.  Keep electrical cords out of the way.  Do not use floor polish or wax that makes  floors slippery. If you must use wax, use non-skid floor wax.  Do not have throw rugs and other things on the floor that can make you trip. What can I do with my stairs?  Do not leave any items on the stairs.  Make sure that there are handrails on both sides of the stairs and use them. Fix handrails that are broken or loose. Make sure that handrails are as long as the stairways.  Check any carpeting to make sure that it is firmly attached to the stairs. Fix any carpet that is loose or worn.  Avoid having throw rugs at the top or bottom of the stairs. If you do have throw rugs, attach them to the floor with carpet tape.  Make sure that you have a light switch at the top of the stairs and the bottom of the stairs. If you do not have them, ask someone to add them for you. What else can I do to help prevent falls?  Wear shoes that:  Do not have high heels.  Have rubber bottoms.  Are comfortable and fit you  well.  Are closed at the toe. Do not wear sandals.  If you use a stepladder:  Make sure that it is fully opened. Do not climb a closed stepladder.  Make sure that both sides of the stepladder are locked into place.  Ask someone to hold it for you, if possible.  Clearly mark and make sure that you can see:  Any grab bars or handrails.  First and last steps.  Where the edge of each step is.  Use tools that help you move around (mobility aids) if they are needed. These include:  Canes.  Walkers.  Scooters.  Crutches.  Turn on the lights when you go into a dark area. Replace any light bulbs as soon as they burn out.  Set up your furniture so you have a clear path. Avoid moving your furniture around.  If any of your floors are uneven, fix them.  If there are any pets around you, be aware of where they are.  Review your medicines with your doctor. Some medicines can make you feel dizzy. This can increase your chance of falling. Ask your doctor what other things that you can do to help prevent falls. This information is not intended to replace advice given to you by your health care provider. Make sure you discuss any questions you have with your health care provider. Document Released: 03/31/2009 Document Revised: 11/10/2015 Document Reviewed: 07/09/2014 Elsevier Interactive Patient Education  2017 [employer].

## 2019-09-02 ENCOUNTER — Telehealth: Payer: Self-pay | Admitting: Gastroenterology

## 2019-09-02 NOTE — Telephone Encounter (Signed)
Pt wife left vm to cancel p[t procedure due to his insurance not covering it she would like a call back

## 2019-09-02 NOTE — Telephone Encounter (Signed)
Patients wife contacted office to cancel her husband's colonoscopy due to insurance will not cover it at this time.  Almyra Free in Endo has been asked to cancel colonoscopy for patient scheduled with Dr. Vicente Males on 09/14/19.  Referral updated to reflect cancellation and reason.  Thanks,  [place], [place]

## 2019-09-11 ENCOUNTER — Ambulatory Visit: Payer: Self-pay | Admitting: Family Medicine

## 2019-09-14 ENCOUNTER — Ambulatory Visit: Admit: 2019-09-14 | Payer: Medicare Other | Admitting: Gastroenterology

## 2019-09-14 SURGERY — COLONOSCOPY WITH PROPOFOL
Anesthesia: General

## 2019-12-04 NOTE — Progress Notes (Signed)
Established patient visit   Patient: Jonathan Greer   DOB: Apr 28, 1946   74 y.o. Male  MRN: [id number] Visit Date: 12/07/2019  Today's healthcare provider: Lelon Huh, MD   Chief Complaint  Patient presents with  . Pre-diabetes   Mertie Moores as a scribe for Lelon Huh, MD.,have documented all relevant documentation on the behalf of Lennox Dolberry, MD,as directed by  Lelon Huh, MD while in the presence of Lelon Huh, MD.   Subjective    HPI Prediabetes, Follow-up  Lab Results  Component Value Date   HGBA1C 6.3 (A) 08/26/2019   HGBA1C 6.1 (H) 03/20/2019   GLUCOSE 107 (H) 08/26/2019   GLUCOSE 85 03/20/2019   GLUCOSE 82 07/19/2017    Last seen for for this 3 months ago.  Management since that visit includes counseling patient to continue diet modification. Current symptoms include none  He has been much more active with the spring weather and frequently mowing lawns as below.  Prior visit with dietician: no Current diet: well balanced Current exercise: no regular exercise, but mows 5 yards on Fridays  Pertinent Labs:    Component Value Date/Time   CHOL 201 (H) 03/20/2019 1450   TRIG 69 03/20/2019 1450   CHOLHDL 3.2 03/20/2019 1450   CREATININE 1.19 08/26/2019 0836    Wt Readings from Last 3 Encounters:  12/07/19 255 lb 6.4 oz (115.8 kg)  08/26/19 260 lb (117.9 kg)  03/20/19 244 lb (110.7 kg)    -----------------------------------------------------------------------------------------   Medications: Outpatient Medications Prior to Visit  Medication Sig  . tamsulosin (FLOMAX) 0.4 MG CAPS capsule Take 1 capsule (0.4 mg total) by mouth daily. (Patient not taking: Reported on 08/26/2019)  . [DISCONTINUED] ibuprofen (ADVIL) 200 MG tablet Take 200 mg by mouth every 6 (six) hours as needed.   No facility-administered medications prior to visit.    Review of Systems  Constitutional: Negative for appetite change, chills and fever.    Respiratory: Negative for chest tightness, shortness of breath and wheezing.   Cardiovascular: Negative for chest pain and palpitations.  Gastrointestinal: Negative for abdominal pain, nausea and vomiting.     Objective    BP (!) 146/84 (BP Location: Right Arm, Patient Position: Sitting, Cuff Size: Large)   Pulse (!) 56   Temp (!) 96.9 F (36.1 C) (Temporal)   Wt 255 lb 6.4 oz (115.8 kg)   BMI 37.72 kg/m   Physical Exam   General appearance: Obese male, cooperative and in no acute distress Head: Normocephalic, without obvious abnormality, atraumatic Respiratory: Respirations even and unlabored, normal respiratory rate Extremities: All extremities are intact.  Skin: Skin color, texture, turgor normal. No rashes seen  Psych: Appropriate mood and affect. Neurologic: Mental status: Alert, oriented to person, place, and time, thought content appropriate.   Results for orders placed or performed in visit on 12/07/19  POCT HgB A1C  Result Value Ref Range   Hemoglobin A1C 6.0 (A) 4.0 - 5.6 %   Est. average glucose Bld gHb Est-mCnc 126     Assessment & Plan     1. Prediabetes Doing better with increased physical activity and weight loss. Counseled on limiting sugars and enriched starches.   2. Elevated BP without diagnosis of hypertension Counseled on salt and sodium dietary limits. Continue working on weight loss  Follow up for CPE in October.        The entirety of the information documented in the History of Present Illness, Review of Systems and Physical Exam were  personally obtained by me. Portions of this information were initially documented by the CMA and reviewed by me for thoroughness and accuracy.      Lelon Huh, MD  [facility] [phone] (phone) [phone] (fax)  [place]

## 2019-12-07 ENCOUNTER — Encounter: Payer: Self-pay | Admitting: Family Medicine

## 2019-12-07 ENCOUNTER — Other Ambulatory Visit: Payer: Self-pay

## 2019-12-07 ENCOUNTER — Ambulatory Visit (INDEPENDENT_AMBULATORY_CARE_PROVIDER_SITE_OTHER): Payer: Medicare Other | Admitting: Family Medicine

## 2019-12-07 VITALS — BP 146/84 | HR 56 | Temp 96.9°F | Wt 255.4 lb

## 2019-12-07 DIAGNOSIS — R030 Elevated blood-pressure reading, without diagnosis of hypertension: Secondary | ICD-10-CM | POA: Diagnosis not present

## 2019-12-07 DIAGNOSIS — R7303 Prediabetes: Secondary | ICD-10-CM | POA: Diagnosis not present

## 2019-12-07 LAB — POCT GLYCOSYLATED HEMOGLOBIN (HGB A1C)
Est. average glucose Bld gHb Est-mCnc: 126
Hemoglobin A1C: 6 % — AB (ref 4.0–5.6)

## 2019-12-07 NOTE — Patient Instructions (Addendum)
. Covid-19 vaccines: The Covid vaccines have been given to hundreds of millions of people in the [place] and found to be very effective and are as safe as any other vaccine.  The [employer] vaccine has been associated with very rare dangerous blood clots, but only in adult women under the age of 71.  The risk of dying from Covid infections is much higher than having a serious reaction to the vaccine.  I strongly recommend getting fully vaccinated against Covid-19.  I recommend that adult women under 60 get fully vaccinated, but the [place] vaccines may be safer for those women than the [employer] vaccine.     DASH Eating Plan DASH stands for "Dietary Approaches to Stop Hypertension." The DASH eating plan is a healthy eating plan that has been shown to reduce high blood pressure (hypertension). It may also reduce your risk for type 2 diabetes, heart disease, and stroke. The DASH eating plan may also help with weight loss. What are tips for following this plan?  General guidelines  Avoid eating more than 2,300 mg (milligrams) of salt (sodium) a day. If you have hypertension, you may need to reduce your sodium intake to 1,500 mg a day.  Limit alcohol intake to no more than 1 drink a day for nonpregnant women and 2 drinks a day for men. One drink equals 12 oz of beer, 5 oz of wine, or 1 oz of hard liquor.  Work with your health care provider to maintain a healthy body weight or to lose weight. Ask what an ideal weight is for you.  Get at least 30 minutes of exercise that causes your heart to beat faster (aerobic exercise) most days of the week. Activities may include walking, swimming, or biking.  Work with your health care provider or diet and nutrition specialist (dietitian) to adjust your eating plan to your individual calorie needs. Reading food labels   Check food labels for the amount of sodium per serving. Choose foods with less than 5 percent of the Daily  Value of sodium. Generally, foods with less than 300 mg of sodium per serving fit into this eating plan.  To find whole grains, look for the word "whole" as the first word in the ingredient list. Shopping  Buy products labeled as "low-sodium" or "no salt added."  Buy fresh foods. Avoid canned foods and premade or frozen meals. Cooking  Avoid adding salt when cooking. Use salt-free seasonings or herbs instead of table salt or sea salt. Check with your health care provider or pharmacist before using salt substitutes.  Do not fry foods. Cook foods using healthy methods such as baking, boiling, grilling, and broiling instead.  Cook with heart-healthy oils, such as olive, canola, soybean, or sunflower oil. Meal planning  Eat a balanced diet that includes: ? 5 or more servings of fruits and vegetables each day. At each meal, try to fill half of your plate with fruits and vegetables. ? Up to 6-8 servings of whole grains each day. ? Less than 6 oz of lean meat, poultry, or fish each day. A 3-oz serving of meat is about the same size as a deck of cards. One egg equals 1 oz. ? 2 servings of low-fat dairy each day. ? A serving of nuts, seeds, or beans 5 times each week. ? Heart-healthy fats. Healthy fats called Omega-3 fatty acids are found in foods such as flaxseeds and coldwater fish, like sardines, salmon, and mackerel.  Limit  how much you eat of the following: ? Canned or prepackaged foods. ? Food that is high in trans fat, such as fried foods. ? Food that is high in saturated fat, such as fatty meat. ? Sweets, desserts, sugary drinks, and other foods with added sugar. ? Full-fat dairy products.  Do not salt foods before eating.  Try to eat at least 2 vegetarian meals each week.  Eat more home-cooked food and less restaurant, buffet, and fast food.  When eating at a restaurant, ask that your food be prepared with less salt or no salt, if possible. What foods are recommended? The  items listed may not be a complete list. Talk with your dietitian about what dietary choices are best for you. Grains Whole-grain or whole-wheat bread. Whole-grain or whole-wheat pasta. Brown rice. [place]. Bulgur. Whole-grain and low-sodium cereals. Pita bread. Low-fat, low-sodium crackers. Whole-wheat flour tortillas. Vegetables Fresh or frozen vegetables (raw, steamed, roasted, or grilled). Low-sodium or reduced-sodium tomato and vegetable juice. Low-sodium or reduced-sodium tomato sauce and tomato paste. Low-sodium or reduced-sodium canned vegetables. Fruits All fresh, dried, or frozen fruit. Canned fruit in natural juice (without added sugar). Meat and other protein foods Skinless chicken or [place]. Ground chicken or [place]. Pork with fat trimmed off. Fish and seafood. Egg whites. Dried beans, peas, or lentils. Unsalted nuts, nut butters, and seeds. Unsalted canned beans. Lean cuts of beef with fat trimmed off. Low-sodium, lean deli meat. Dairy Low-fat (1%) or fat-free (skim) milk. Fat-free, low-fat, or reduced-fat cheeses. Nonfat, low-sodium ricotta or cottage cheese. Low-fat or nonfat yogurt. Low-fat, low-sodium cheese. Fats and oils Soft margarine without trans fats. Vegetable oil. Low-fat, reduced-fat, or light mayonnaise and salad dressings (reduced-sodium). Canola, safflower, olive, soybean, and sunflower oils. Avocado. Seasoning and other foods Herbs. Spices. Seasoning mixes without salt. Unsalted popcorn and pretzels. Fat-free sweets. What foods are not recommended? The items listed may not be a complete list. Talk with your dietitian about what dietary choices are best for you. Grains Baked goods made with fat, such as croissants, muffins, or some breads. Dry pasta or rice meal packs. Vegetables Creamed or fried vegetables. Vegetables in a cheese sauce. Regular canned vegetables (not low-sodium or reduced-sodium). Regular canned tomato sauce and paste (not low-sodium or  reduced-sodium). Regular tomato and vegetable juice (not low-sodium or reduced-sodium). Angie Fava. Olives. Fruits Canned fruit in a light or heavy syrup. Fried fruit. Fruit in cream or butter sauce. Meat and other protein foods Fatty cuts of meat. Ribs. Fried meat. Berniece Salines. Sausage. Bologna and other processed lunch meats. Salami. Fatback. Hotdogs. Bratwurst. Salted nuts and seeds. Canned beans with added salt. Canned or smoked fish. Whole eggs or egg yolks. Chicken or [place] with skin. Dairy Whole or 2% milk, cream, and half-and-half. Whole or full-fat cream cheese. Whole-fat or sweetened yogurt. Full-fat cheese. Nondairy creamers. Whipped toppings. Processed cheese and cheese spreads. Fats and oils Butter. Stick margarine. Lard. Shortening. Ghee. Bacon fat. Tropical oils, such as coconut, palm kernel, or palm oil. Seasoning and other foods Salted popcorn and pretzels. Onion salt, garlic salt, seasoned salt, table salt, and sea salt. Worcestershire sauce. Tartar sauce. Barbecue sauce. Teriyaki sauce. Soy sauce, including reduced-sodium. Steak sauce. Canned and packaged gravies. Fish sauce. Oyster sauce. Cocktail sauce. Horseradish that you find on the shelf. Ketchup. Mustard. Meat flavorings and tenderizers. Bouillon cubes. Hot sauce and Tabasco sauce. Premade or packaged marinades. Premade or packaged taco seasonings. Relishes. Regular salad dressings. Where to find more information:  National Heart, Lung, and Blood Institute:  https://wilson-eaton.com/  American Heart Association: www.heart.org Summary  The DASH eating plan is a healthy eating plan that has been shown to reduce high blood pressure (hypertension). It may also reduce your risk for type 2 diabetes, heart disease, and stroke.  With the DASH eating plan, you should limit salt (sodium) intake to 2,300 mg a day. If you have hypertension, you may need to reduce your sodium intake to 1,500 mg a day.  When on the DASH eating plan, aim to eat more  fresh fruits and vegetables, whole grains, lean proteins, low-fat dairy, and heart-healthy fats.  Work with your health care provider or diet and nutrition specialist (dietitian) to adjust your eating plan to your individual calorie needs. This information is not intended to replace advice given to you by your health care provider. Make sure you discuss any questions you have with your health care provider. Document Revised: 05/17/2017 Document Reviewed: 05/28/2016 Elsevier Patient Education  2020 [employer].

## 2020-03-21 ENCOUNTER — Other Ambulatory Visit: Payer: Self-pay

## 2020-03-21 ENCOUNTER — Ambulatory Visit (INDEPENDENT_AMBULATORY_CARE_PROVIDER_SITE_OTHER): Payer: Medicare Other | Admitting: Family Medicine

## 2020-03-21 ENCOUNTER — Encounter: Payer: Self-pay | Admitting: Family Medicine

## 2020-03-21 VITALS — BP 136/83 | HR 50 | Temp 97.8°F | Resp 16 | Wt 248.0 lb

## 2020-03-21 DIAGNOSIS — Z1211 Encounter for screening for malignant neoplasm of colon: Secondary | ICD-10-CM

## 2020-03-21 DIAGNOSIS — E785 Hyperlipidemia, unspecified: Secondary | ICD-10-CM | POA: Diagnosis not present

## 2020-03-21 DIAGNOSIS — K760 Fatty (change of) liver, not elsewhere classified: Secondary | ICD-10-CM

## 2020-03-21 DIAGNOSIS — Z86010 Personal history of colonic polyps: Secondary | ICD-10-CM

## 2020-03-21 DIAGNOSIS — Z23 Encounter for immunization: Secondary | ICD-10-CM | POA: Diagnosis not present

## 2020-03-21 DIAGNOSIS — Z0000 Encounter for general adult medical examination without abnormal findings: Secondary | ICD-10-CM

## 2020-03-21 DIAGNOSIS — R7303 Prediabetes: Secondary | ICD-10-CM

## 2020-03-21 DIAGNOSIS — Z125 Encounter for screening for malignant neoplasm of prostate: Secondary | ICD-10-CM

## 2020-03-21 NOTE — Addendum Note (Signed)
Addended by: Meyer Cory L on: 03/21/2020 10:03 AM   Modules accepted: Orders, SmartSet

## 2020-03-21 NOTE — Patient Instructions (Signed)
.    Please review the attached list of medications and notify my office if there are any errors.   . Please bring all of your medications to every appointment so we can make sure that our medication list is the same as yours.   . Covid-19 vaccines: The Covid vaccines have been given to hundreds of millions of people and found to be very effective and are as safe as any other vaccine.  The [employer] vaccine has been associated with very rare dangerous blood clots, but only in adult women under the age of 60.  The risk of dying from Covid infections is much higher than having a serious reaction to the vaccine.  I strongly recommend getting fully vaccinated against Covid-19.  I recommend that adult women under 60 get fully vaccinated, but the Moderna and Pfizer vaccines may be safer for those women than the [employer] vaccine.   

## 2020-03-21 NOTE — Progress Notes (Signed)
Complete physical exam   Patient: Jonathan Greer   DOB: 1946-02-02   74 y.o. Male  MRN: [id number] Visit Date: 03/21/2020  Today's healthcare provider: Lelon Huh, MD   Chief Complaint  Patient presents with  . Annual Exam  . Prediabetes  . Blood Pressure Check   Subjective    Jonathan Greer is a 74 y.o. male who presents today for a complete physical exam.  He reports consuming a general diet. Home exercise routine includes mowing lawns- cuts 6 yards weekly. He generally feels fairly well. He reports sleeping fairly well. He does not have additional problems to discuss today.   Had AWV with [facility] on 08/31/2019.  HPI  Prediabetes, Follow-up  Lab Results  Component Value Date   HGBA1C 6.0 (A) 12/07/2019   HGBA1C 6.3 (A) 08/26/2019   HGBA1C 6.1 (H) 03/20/2019   GLUCOSE 107 (H) 08/26/2019   GLUCOSE 85 03/20/2019   GLUCOSE 82 07/19/2017    Last seen for for this 3 months ago.  Management since that visit includes counseling patient on limiting sugars and enriched starches.  Current symptoms include none and have been stable.  Prior visit with dietician: no Current diet: well balanced Current exercise: mowing 6 lawns weekly  Pertinent Labs:    Component Value Date/Time   CHOL 201 (H) 03/20/2019 1450   TRIG 69 03/20/2019 1450   CHOLHDL 3.2 03/20/2019 1450   CREATININE 1.19 08/26/2019 0836    Wt Readings from Last 3 Encounters:  12/07/19 255 lb 6.4 oz (115.8 kg)  08/26/19 260 lb (117.9 kg)  03/20/19 244 lb (110.7 kg)    -----------------------------------------------------------------------------------------  Follow up for elevated blood pressure:  The patient was last seen for this 3 months ago. Management during that visit includes counseling patient on salt and sodium dietary limits. Continue working on weight loss.  He reports good compliance with treatment. Blood pressures are not checked at home. He feels that condition is  Unchanged. He is not having side effects.   -----------------------------------------------------------------------------------------   Past Medical History:  Diagnosis Date  . Basal cell carcinoma    nose  . Hyperlipemia 07/07/2015  . Hyperlipidemia   . Shingles 07/07/2015  . Wears dentures    Past Surgical History:  Procedure Laterality Date  . CATARACT EXTRACTION W/PHACO Left 03/13/2017   Procedure: CATARACT EXTRACTION PHACO AND INTRAOCULAR LENS PLACEMENT ([place]) LEFT;  Surgeon: Leandrew Koyanagi, MD;  Location: [facility];  Service: Ophthalmology;  Laterality: Left;  IVA TOPICAL LEFT  . CATARACT EXTRACTION W/PHACO Right 04/17/2017   Procedure: CATARACT EXTRACTION PHACO AND INTRAOCULAR LENS PLACEMENT ([facility]);  Surgeon: Leandrew Koyanagi, MD;  Location: [facility];  Service: Ophthalmology;  Laterality: Right;  IVA TOPICAL RIGHT  . COLONOSCOPY WITH PROPOFOL N/A 07/27/2016   Procedure: COLONOSCOPY WITH PROPOFOL;  Surgeon: [name], MD;  Location: [facility];  Service: Endoscopy;  Laterality: N/A;  . HERNIA REPAIR    . MOHS SURGERY  01/10/2016   [facility]  . TONSILLECTOMY AND ADENOIDECTOMY  1960's   Social History   Socioeconomic History  . Marital status: Married    Spouse name: Not on file  . Number of children: 0  . Years of education: Not on file  . Highest education level: 10th grade  Occupational History  . Occupation: Retired  Tobacco Use  . Smoking status: Former Smoker    Packs/day: 2.00    Years: 20.00    Pack years: 40.00    Types:  Cigarettes    Quit date: 06/19/1975    Years since quitting: 44.7  . Smokeless tobacco: Former Systems developer    Types: Chew  . Tobacco comment: Quit in the 5s  Vaping Use  . Vaping Use: Never used  Substance and Sexual Activity  . Alcohol use: No    Alcohol/week: 0.0 standard drinks  . Drug use: No  . Sexual activity: Not on file  Other Topics Concern  . Not on file  Social History Narrative   Working 5  days a week in [employer]. (06/2016)   Social Determinants of Health   Financial Resource Strain: Low Risk   . Difficulty of Paying Living Expenses: Not hard at all  Food Insecurity: No Food Insecurity  . Worried About Charity fundraiser in the Last Year: Never true  . Ran Out of Food in the Last Year: Never true  Transportation Needs: No Transportation Needs  . Lack of Transportation (Medical): No  . Lack of Transportation (Non-Medical): No  Physical Activity: Inactive  . Days of Exercise per Week: 0 days  . Minutes of Exercise per Session: 0 min  Stress: No Stress Concern Present  . Feeling of Stress : Not at all  Social Connections: Moderately Isolated  . Frequency of Communication with Friends and Family: Once a week  . Frequency of Social Gatherings with Friends and Family: Once a week  . Attends Religious Services: More than 4 times per year  . Active Member of Clubs or Organizations: No  . Attends Archivist Meetings: Never  . Marital Status: Married  Human resources officer Violence: Not At Risk  . Fear of Current or Ex-Partner: No  . Emotionally Abused: No  . Physically Abused: No  . Sexually Abused: No   Family Status  Relation Name Status  . Mother  Deceased at age 59  . Father  Deceased at age 43  . Brother  Deceased  . MGM  Deceased  . MGF  Deceased  . PGM  Deceased  . PGF  Deceased  . Neg Hx  (Not Specified)   Family History  Problem Relation Age of Onset  . Heart failure Mother   . CVA Father   . Lung cancer Brother   . Liver cancer Maternal Grandmother   . Cancer Paternal Grandmother   . Cancer Paternal Grandfather   . Bladder Cancer Neg Hx   . Prostate cancer Neg Hx   . Kidney cancer Neg Hx    No Known Allergies  Patient Care Team: Birdie Sons, MD as PCP - General (Family Medicine) Pa, Reid as Consulting Physician (Optometry) [name], MD as Consulting Physician (Gastroenterology) Tamsen Meek, MD as  Referring Physician (Dermatology)   Medications: Outpatient Medications Prior to Visit  Medication Sig  . tamsulosin (FLOMAX) 0.4 MG CAPS capsule Take 1 capsule (0.4 mg total) by mouth daily.   No facility-administered medications prior to visit.    Review of Systems  Constitutional: Negative for appetite change, chills, fatigue and fever.  HENT: Negative for congestion, ear pain, hearing loss, nosebleeds and trouble swallowing.   Eyes: Negative for pain and visual disturbance.  Respiratory: Negative for cough, chest tightness and shortness of breath.   Cardiovascular: Negative for chest pain, palpitations and leg swelling.  Gastrointestinal: Negative for abdominal pain, blood in stool, constipation, diarrhea, nausea and vomiting.  Endocrine: Negative for polydipsia, polyphagia and polyuria.  Genitourinary: Negative for dysuria and flank pain.  Musculoskeletal: Negative for arthralgias, back  pain, joint swelling, myalgias and neck stiffness.  Skin: Negative for color change, rash and wound.  Neurological: Negative for dizziness, tremors, seizures, speech difficulty, weakness, light-headedness and headaches.  Psychiatric/Behavioral: Negative for behavioral problems, confusion, decreased concentration, dysphoric mood and sleep disturbance. The patient is not nervous/anxious.   All other systems reviewed and are negative.     Objective    BP 136/83   Pulse (!) 50   Temp 97.8 F (36.6 C) (Oral)   Resp 16   Wt 248 lb (112.5 kg)   BMI 36.62 kg/m    Physical Exam   General Appearance:    Obese male. Alert, cooperative, in no acute distress, appears stated age  Head:    Normocephalic, without obvious abnormality, atraumatic  Eyes:    PERRL, conjunctiva/corneas clear, EOM's intact, fundi    benign, both eyes       Ears:    Normal TM's and external ear canals, both ears  Nose:   Nares normal, septum midline, mucosa normal, no drainage   or sinus tenderness  Throat:   Lips,  mucosa, and tongue normal; teeth and gums normal  Neck:   Supple, symmetrical, trachea midline, no adenopathy;       thyroid:  No enlargement/tenderness/nodules; no carotid   bruit or JVD  Back:     Symmetric, no curvature, ROM normal, no CVA tenderness  Lungs:     Clear to auscultation bilaterally, respirations unlabored  Chest wall:    No tenderness or deformity  Heart:    Bradycardic. Normal rhythm. No murmurs, rubs, or gallops.  S1 and S2 normal  Abdomen:     Soft, non-tender, bowel sounds active all four quadrants,    no masses, no organomegaly  Genitalia:    deferred  Rectal:    deferred  Extremities:   All extremities are intact. No cyanosis or edema  Pulses:   2+ and symmetric all extremities  Skin:   Skin color, texture, turgor normal, no rashes or lesions  Lymph nodes:   Cervical, supraclavicular, and axillary nodes normal  Neurologic:   CNII-XII intact. Normal strength, sensation and reflexes      throughout     Last depression screening scores PHQ 2/9 Scores 03/21/2020 08/31/2019 08/28/2018  PHQ - 2 Score 0 0 0  PHQ- 9 Score 0 - 0   Last fall risk screening Fall Risk  03/21/2020  Falls in the past year? 0  Number falls in past yr: 0  Comment -  Injury with Fall? 0  Comment -  Follow up Falls evaluation completed   Last Audit-C alcohol use screening Alcohol Use Disorder Test (AUDIT) 03/21/2020  1. How often do you have a drink containing alcohol? 0  2. How many drinks containing alcohol do you have on a typical day when you are drinking? 0  3. How often do you have six or more drinks on one occasion? 0  AUDIT-C Score 0  Alcohol Brief Interventions/Follow-up AUDIT Score <7 follow-up not indicated   A score of 3 or more in women, and 4 or more in men indicates increased risk for alcohol abuse, EXCEPT if all of the points are from question 1   No results found for any visits on 03/21/20.  Assessment & Plan    Routine Health Maintenance and Physical Exam  Exercise  Activities and Dietary recommendations Goals    . Diet      Recommend to continue with current diet plan of cutting out all foods that  are white (focusing on carbs and sugars).     Marland Kitchen DIET - INCREASE WATER INTAKE     Recommend to drink at least 6-8 8oz glasses of water per day.       Immunization History  Administered Date(s) Administered  . Fluad Quad(high Dose 65+) 03/20/2019  . Influenza, High Dose Seasonal PF 06/27/2016, 04/26/2017, 05/02/2018  . Pneumococcal Conjugate-13 10/07/2014  . Pneumococcal Polysaccharide-23 03/18/2013  . Tdap 06/30/2010, 01/15/2017    Health Maintenance  Topic Date Due  . COVID-19 Vaccine (1) Never done  . COLONOSCOPY  07/27/2017  . INFLUENZA VACCINE  01/17/2020  . TETANUS/TDAP  01/16/2027  . Hepatitis C Screening  Completed  . PNA vac Low Risk Adult  Completed    Discussed health benefits of physical activity, and encouraged him to engage in regular exercise appropriate for his age and condition.  1. Annual physical exam   2. Prediabetes  - Hemoglobin A1c  3. Morbid obesity ([place]) Counseled on regular exercise on low sugar/starch diet.   4. Hyperlipidemia, unspecified hyperlipidemia type  - CBC - Comprehensive metabolic panel - Lipid panel  5. Hepatic steatosis Likely secondary to obesity  6. Personal history of colonic polyps Multiple adenomas removed piecemeal on colonoscopy 07/2016 and was supposed to have had repeat colonoscopy August 2018 which has not been done yet.  - Ambulatory referral to gastroenterology for colonoscopy  7. Colon cancer screening  - Ambulatory referral to gastroenterology for colonoscopy  8. Prostate cancer screening PSA       The entirety of the information documented in the History of Present Illness, Review of Systems and Physical Exam were personally obtained by me. Portions of this information were initially documented by the CMA and reviewed by me for thoroughness and accuracy.      Lelon Huh, MD  [facility] [phone] (phone) [phone] (fax)  [place]

## 2020-03-22 ENCOUNTER — Telehealth: Payer: Self-pay

## 2020-03-22 ENCOUNTER — Ambulatory Visit: Payer: Self-pay | Admitting: *Deleted

## 2020-03-22 DIAGNOSIS — E785 Hyperlipidemia, unspecified: Secondary | ICD-10-CM

## 2020-03-22 LAB — CBC
Hematocrit: 47 % (ref 37.5–51.0)
Hemoglobin: 15.5 g/dL (ref 13.0–17.7)
MCH: 30.9 pg (ref 26.6–33.0)
MCHC: 33 g/dL (ref 31.5–35.7)
MCV: 94 fL (ref 79–97)
Platelets: 208 10*3/uL (ref 150–450)
RBC: 5.02 x10E6/uL (ref 4.14–5.80)
RDW: 13.2 % (ref 11.6–15.4)
WBC: 6.3 10*3/uL (ref 3.4–10.8)

## 2020-03-22 LAB — COMPREHENSIVE METABOLIC PANEL
ALT: 39 IU/L (ref 0–44)
AST: 36 IU/L (ref 0–40)
Albumin/Globulin Ratio: 1.7 (ref 1.2–2.2)
Albumin: 4.4 g/dL (ref 3.7–4.7)
Alkaline Phosphatase: 50 IU/L (ref 44–121)
BUN/Creatinine Ratio: 14 (ref 10–24)
BUN: 15 mg/dL (ref 8–27)
Bilirubin Total: 0.7 mg/dL (ref 0.0–1.2)
CO2: 21 mmol/L (ref 20–29)
Calcium: 9.8 mg/dL (ref 8.6–10.2)
Chloride: 103 mmol/L (ref 96–106)
Creatinine, Ser: 1.06 mg/dL (ref 0.76–1.27)
GFR calc Af Amer: 80 mL/min/{1.73_m2} (ref >59)
GFR calc non Af Amer: 69 mL/min/{1.73_m2} (ref >59)
Globulin, Total: 2.6 g/dL (ref 1.5–4.5)
Glucose: 95 mg/dL (ref 65–99)
Potassium: 4.2 mmol/L (ref 3.5–5.2)
Sodium: 139 mmol/L (ref 134–144)
Total Protein: 7 g/dL (ref 6.0–8.5)

## 2020-03-22 LAB — LIPID PANEL
Chol/HDL Ratio: 3.6 ratio (ref 0.0–5.0)
Cholesterol, Total: 209 mg/dL — ABNORMAL HIGH (ref 100–199)
HDL: 58 mg/dL (ref >39)
LDL Chol Calc (NIH): 130 mg/dL — ABNORMAL HIGH (ref 0–99)
Triglycerides: 116 mg/dL (ref 0–149)
VLDL Cholesterol Cal: 21 mg/dL (ref 5–40)

## 2020-03-22 LAB — HEMOGLOBIN A1C
Est. average glucose Bld gHb Est-mCnc: 128 mg/dL
Hgb A1c MFr Bld: 6.1 % — ABNORMAL HIGH (ref 4.8–5.6)

## 2020-03-22 MED ORDER — PRAVASTATIN SODIUM 40 MG PO TABS: 40.0000 mg | ORAL_TABLET | Freq: Every day | ORAL | 3 refills | Status: DC

## 2020-03-22 NOTE — Telephone Encounter (Signed)
Spoke with patients wife and advised her of labs. Follow up appointment scheduled. Prescription sent into pharmacy. Will call [facility] to have PSA added per Dr. Maralyn Sago request tomorrow.

## 2020-03-22 NOTE — Telephone Encounter (Signed)
Patient's wife calling to confirm which medication Dr. Caryn Section prescribed for patient , which is pravastatin 40 mg once daily . Reviewed with patient's wife pharmacy would notify when Rx is ready. Patient's wife verbalized understanding .

## 2020-03-22 NOTE — Telephone Encounter (Signed)
Pt given lab results per notes of Dr. Caryn Section on 03/22/20. Pt verbalized understanding.

## 2020-03-22 NOTE — Telephone Encounter (Signed)
LMTCB-if patient calls back ok for P[facility] nurse to give results °

## 2020-03-22 NOTE — Telephone Encounter (Signed)
-----   Message from Birdie Sons, MD sent at 03/22/2020  8:07 AM EDT ----- Please see below. ALSO, can you please have [facility] ADD PSA to labs drawn yesterday. Thanks!

## 2020-03-22 NOTE — Addendum Note (Signed)
Addended by: Randal Buba on: 03/22/2020 05:14 PM   Modules accepted: Orders

## 2020-03-23 NOTE — Telephone Encounter (Signed)
PSA was added yesterday (03/22/2020) at 12:03pm by Joesline per documentation on lab results.

## 2020-03-24 LAB — PSA: Prostate Specific Ag, Serum: 0.6 ng/mL (ref 0.0–4.0)

## 2020-03-24 LAB — SPECIMEN STATUS REPORT

## 2020-04-07 ENCOUNTER — Telehealth (INDEPENDENT_AMBULATORY_CARE_PROVIDER_SITE_OTHER): Payer: Self-pay | Admitting: Gastroenterology

## 2020-04-07 ENCOUNTER — Other Ambulatory Visit: Payer: Self-pay

## 2020-04-07 DIAGNOSIS — Z86010 Personal history of colonic polyps: Secondary | ICD-10-CM

## 2020-04-07 MED ORDER — NA SULFATE-K SULFATE-MG SULF 17.5-3.13-1.6 GM/177ML PO SOLN: 1.0000 | Freq: Once | ORAL | 0 refills | Status: AC

## 2020-04-07 NOTE — Progress Notes (Signed)
Gastroenterology Pre-Procedure Review  Request Date: Monday 05/02/20 Requesting Physician: Dr. Vicente Males  PATIENT REVIEW QUESTIONS: The patient responded to the following health history questions as indicated:    1. Are you having any GI issues? no 2. Do you have a personal history of Polyps? yes (2018 colonoscopy performed by Dr. Vicente Males) 3. Do you have a family history of Colon Cancer or Polyps? no 4. Diabetes Mellitus? no 5. Joint replacements in the past 12 months?no 6. Major health problems in the past 3 months?no 7. Any artificial heart valves, MVP, or defibrillator?no    MEDICATIONS & ALLERGIES:    Patient reports the following regarding taking any anticoagulation/antiplatelet therapy:   Plavix, Coumadin, Eliquis, Xarelto, Lovenox, Pradaxa, Brilinta, or Effient? no Aspirin? no  Patient confirms/reports the following medications:  Current Outpatient Medications  Medication Sig Dispense Refill  . pravastatin (PRAVACHOL) 40 MG tablet Take 1 tablet (40 mg total) by mouth daily. 30 tablet 3  . Na Sulfate-K Sulfate-Mg Sulf 17.5-3.13-1.6 GM/177ML SOLN Take 1 kit by mouth once for 1 dose. 354 mL 0  . tamsulosin (FLOMAX) 0.4 MG CAPS capsule Take 1 capsule (0.4 mg total) by mouth daily. (Patient not taking: Reported on 04/07/2020) 30 capsule 0   No current facility-administered medications for this visit.    Patient confirms/reports the following allergies:  No Known Allergies  No orders of the defined types were placed in this encounter.   AUTHORIZATION INFORMATION Primary Insurance: 1D#: Group #:  Secondary Insurance: 1D#: Group #:  SCHEDULE INFORMATION: Date: 05/02/20 Time: Location:[facility]

## 2020-04-28 ENCOUNTER — Other Ambulatory Visit: Payer: Self-pay

## 2020-04-28 ENCOUNTER — Other Ambulatory Visit
Admission: RE | Admit: 2020-04-28 | Discharge: 2020-04-28 | Disposition: A | Discharge status: Home / Self Care | Payer: Medicare Other | Source: Ambulatory Visit | Attending: Gastroenterology | Admitting: Gastroenterology

## 2020-04-28 DIAGNOSIS — Z20822 Contact with and (suspected) exposure to covid-19: Secondary | ICD-10-CM | POA: Diagnosis not present

## 2020-04-28 DIAGNOSIS — Z01812 Encounter for preprocedural laboratory examination: Secondary | ICD-10-CM | POA: Diagnosis not present

## 2020-04-28 LAB — SARS CORONAVIRUS 2 (TAT 6-24 HRS): SARS Coronavirus 2: NEGATIVE

## 2020-05-02 ENCOUNTER — Ambulatory Visit
Admission: RE | Admit: 2020-05-02 | Discharge: 2020-05-02 | Disposition: A | Discharge status: Home / Self Care | Payer: Medicare Other | Attending: Gastroenterology | Admitting: Gastroenterology

## 2020-05-02 ENCOUNTER — Other Ambulatory Visit: Payer: Self-pay

## 2020-05-02 ENCOUNTER — Encounter
Admission: RE | Disposition: A | Discharge status: Home / Self Care | Payer: Self-pay | Source: Home / Self Care | Attending: Gastroenterology

## 2020-05-02 ENCOUNTER — Encounter: Payer: Self-pay | Admitting: Gastroenterology

## 2020-05-02 ENCOUNTER — Ambulatory Visit: Payer: Medicare Other | Admitting: Registered Nurse

## 2020-05-02 DIAGNOSIS — K5790 Diverticulosis of intestine, part unspecified, without perforation or abscess without bleeding: Secondary | ICD-10-CM | POA: Diagnosis not present

## 2020-05-02 DIAGNOSIS — Z86010 Personal history of colonic polyps: Secondary | ICD-10-CM

## 2020-05-02 DIAGNOSIS — Z09 Encounter for follow-up examination after completed treatment for conditions other than malignant neoplasm: Secondary | ICD-10-CM | POA: Diagnosis not present

## 2020-05-02 DIAGNOSIS — Z79899 Other long term (current) drug therapy: Secondary | ICD-10-CM | POA: Diagnosis not present

## 2020-05-02 DIAGNOSIS — K5730 Diverticulosis of large intestine without perforation or abscess without bleeding: Secondary | ICD-10-CM | POA: Insufficient documentation

## 2020-05-02 DIAGNOSIS — D123 Benign neoplasm of transverse colon: Secondary | ICD-10-CM | POA: Diagnosis not present

## 2020-05-02 DIAGNOSIS — K635 Polyp of colon: Secondary | ICD-10-CM

## 2020-05-02 DIAGNOSIS — Z1211 Encounter for screening for malignant neoplasm of colon: Secondary | ICD-10-CM | POA: Diagnosis not present

## 2020-05-02 DIAGNOSIS — Z87891 Personal history of nicotine dependence: Secondary | ICD-10-CM | POA: Diagnosis not present

## 2020-05-02 HISTORY — PX: COLONOSCOPY WITH PROPOFOL: SHX5780

## 2020-05-02 SURGERY — COLONOSCOPY WITH PROPOFOL
Anesthesia: General

## 2020-05-02 MED ORDER — SODIUM CHLORIDE 0.9 % IV SOLN: INTRAVENOUS | Status: DC

## 2020-05-02 MED ORDER — PROPOFOL 10 MG/ML IV BOLUS
INTRAVENOUS | Status: DC | PRN
  Administered 2020-05-02: 90 mg via INTRAVENOUS

## 2020-05-02 MED ORDER — LIDOCAINE HCL (CARDIAC) PF 100 MG/5ML IV SOSY
PREFILLED_SYRINGE | INTRAVENOUS | Status: DC | PRN
  Administered 2020-05-02: 40 mg via INTRAVENOUS

## 2020-05-02 MED ORDER — LIDOCAINE HCL (PF) 2 % IJ SOLN
INTRAMUSCULAR | Status: AC
  Filled 2020-05-02: qty 5

## 2020-05-02 MED ORDER — EPHEDRINE 5 MG/ML INJ
INTRAVENOUS | Status: AC
  Filled 2020-05-02: qty 10

## 2020-05-02 MED ORDER — PROPOFOL 500 MG/50ML IV EMUL
INTRAVENOUS | Status: DC | PRN
  Administered 2020-05-02: 175 ug/kg/min via INTRAVENOUS

## 2020-05-02 MED ORDER — PROPOFOL 500 MG/50ML IV EMUL
INTRAVENOUS | Status: AC
  Filled 2020-05-02: qty 50

## 2020-05-02 NOTE — H&P (Signed)
[name] [address], [place], [place], [place], [address] [address], [place], [place], [place], [address] Phone: [phone]  Fax: [phone]  Primary Care Physician:  Birdie Sons, MD   Pre-Procedure History & Physical: HPI:  Jonathan Greer is a 74 y.o. male is here for an colonoscopy.   Past Medical History:  Diagnosis Date  . Basal cell carcinoma    nose  . Hyperlipemia 07/07/2015  . Hyperlipidemia   . Shingles 07/07/2015  . Wears dentures     Past Surgical History:  Procedure Laterality Date  . CATARACT EXTRACTION W/PHACO Left 03/13/2017   Procedure: CATARACT EXTRACTION PHACO AND INTRAOCULAR LENS PLACEMENT ([place]) LEFT;  Surgeon: Leandrew Koyanagi, MD;  Location: [facility];  Service: Ophthalmology;  Laterality: Left;  IVA TOPICAL LEFT  . CATARACT EXTRACTION W/PHACO Right 04/17/2017   Procedure: CATARACT EXTRACTION PHACO AND INTRAOCULAR LENS PLACEMENT ([facility]);  Surgeon: Leandrew Koyanagi, MD;  Location: [facility];  Service: Ophthalmology;  Laterality: Right;  IVA TOPICAL RIGHT  . COLONOSCOPY WITH PROPOFOL N/A 07/27/2016   Procedure: COLONOSCOPY WITH PROPOFOL;  Surgeon: [name], MD;  Location: [facility];  Service: Endoscopy;  Laterality: N/A;  . HERNIA REPAIR    . MOHS SURGERY  01/10/2016   [facility]  . TONSILLECTOMY AND ADENOIDECTOMY  1960's    Prior to Admission medications   Medication Sig Start Date End Date Taking? Authorizing Provider  pravastatin (PRAVACHOL) 40 MG tablet Take 1 tablet (40 mg total) by mouth daily. 03/22/20   Birdie Sons, MD  tamsulosin (FLOMAX) 0.4 MG CAPS capsule Take 1 capsule (0.4 mg total) by mouth daily. Patient not taking: Reported on 04/07/2020 01/03/18   Darel Hong, MD    Allergies as of 04/08/2020  . (No Known Allergies)    Family History  Problem Relation Age of Onset  . Heart failure Mother   . CVA Father   . Lung cancer Brother   . Liver cancer Maternal  Grandmother   . Cancer Paternal Grandmother   . Cancer Paternal Grandfather   . Bladder Cancer Neg Hx   . Prostate cancer Neg Hx   . Kidney cancer Neg Hx     Social History   Socioeconomic History  . Marital status: Married    Spouse name: Not on file  . Number of children: 0  . Years of education: Not on file  . Highest education level: 10th grade  Occupational History  . Occupation: Retired  Tobacco Use  . Smoking status: Former Smoker    Packs/day: 2.00    Years: 20.00    Pack years: 40.00    Types: Cigarettes    Quit date: 06/19/1975    Years since quitting: 44.9  . Smokeless tobacco: Former Systems developer    Types: Chew  . Tobacco comment: Quit in the 7s  Vaping Use  . Vaping Use: Never used  Substance and Sexual Activity  . Alcohol use: No    Alcohol/week: 0.0 standard drinks  . Drug use: No  . Sexual activity: Not on file  Other Topics Concern  . Not on file  Social History Narrative   Working 5 days a week in [employer]. (06/2016)   Social Determinants of Health   Financial Resource Strain: Low Risk   . Difficulty of Paying Living Expenses: Not hard at all  Food Insecurity: No Food Insecurity  . Worried About Charity fundraiser in the Last Year: Never true  . Ran Out of  Food in the Last Year: Never true  Transportation Needs: No Transportation Needs  . Lack of Transportation (Medical): No  . Lack of Transportation (Non-Medical): No  Physical Activity: Inactive  . Days of Exercise per Week: 0 days  . Minutes of Exercise per Session: 0 min  Stress: No Stress Concern Present  . Feeling of Stress : Not at all  Social Connections: Moderately Isolated  . Frequency of Communication with Friends and Family: Once a week  . Frequency of Social Gatherings with Friends and Family: Once a week  . Attends Religious Services: More than 4 times per year  . Active Member of Clubs or Organizations: No  . Attends Archivist Meetings: Never  . Marital Status:  Married  Human resources officer Violence: Not At Risk  . Fear of Current or Ex-Partner: No  . Emotionally Abused: No  . Physically Abused: No  . Sexually Abused: No    Review of Systems: See HPI, otherwise negative ROS  Physical Exam: BP 117/78   Pulse 67 Comment: Simultaneous filing. User may not have seen previous data.  Temp 98 F (36.7 C) (Temporal)   Resp 16 Comment: Simultaneous filing. User may not have seen previous data.  Ht 5\' 9"  (1.753 m)   Wt 108.9 kg   SpO2 95% Comment: Simultaneous filing. User may not have seen previous data.  BMI 35.44 kg/m  General:   Alert,  pleasant and cooperative in NAD Head:  Normocephalic and atraumatic. Neck:  Supple; no masses or thyromegaly. Lungs:  Clear throughout to auscultation, normal respiratory effort.    Heart:  +S1, +S2, Regular rate and rhythm, No edema. Abdomen:  Soft, nontender and nondistended. Normal bowel sounds, without guarding, and without rebound.   Neurologic:  Alert and  oriented x4;  grossly normal neurologically.  Impression/Plan: Jonathan Greer is here for an colonoscopy to be performed for surveillance due to prior history of colon polyps   Risks, benefits, limitations, and alternatives regarding  colonoscopy have been reviewed with the patient.  Questions have been answered.  All parties agreeable.   [name], MD  05/02/2020, 12:51 PM

## 2020-05-02 NOTE — Anesthesia Preprocedure Evaluation (Signed)
Anesthesia Evaluation  Patient identified by MRN, date of birth, ID band Patient awake    Reviewed: Allergy & Precautions, H&P , NPO status , Patient's Chart, lab work & pertinent test results  History of Anesthesia Complications Negative for: history of anesthetic complications  Airway Mallampati: II  TM Distance: >3 FB     Dental  (+) Upper Dentures, Lower Dentures   Pulmonary neg sleep apnea, neg COPD, former smoker,    breath sounds clear to auscultation       Cardiovascular (-) angina(-) Past MI and (-) Cardiac Stents negative cardio ROS  (-) dysrhythmias  Rhythm:regular Rate:Normal     Neuro/Psych negative neurological ROS  negative psych ROS   GI/Hepatic negative GI ROS, Neg liver ROS,   Endo/Other  negative endocrine ROS  Renal/GU negative Renal ROS  negative genitourinary   Musculoskeletal   Abdominal   Peds  Hematology negative hematology ROS (+)   Anesthesia Other Findings Past Medical History: No date: Basal cell carcinoma     Comment:  nose 07/07/2015: Hyperlipemia No date: Hyperlipidemia 07/07/2015: Shingles No date: Wears dentures  Past Surgical History: 03/13/2017: CATARACT EXTRACTION W/PHACO; Left     Comment:  Procedure: CATARACT EXTRACTION PHACO AND INTRAOCULAR               LENS PLACEMENT ([place]) LEFT;  Surgeon: Leandrew Koyanagi, MD;  Location: [facility];  Service:               Ophthalmology;  Laterality: Left;  IVA TOPICAL LEFT 04/17/2017: CATARACT EXTRACTION W/PHACO; Right     Comment:  Procedure: CATARACT EXTRACTION PHACO AND INTRAOCULAR               LENS PLACEMENT ([facility]);  Surgeon: Leandrew Koyanagi, MD;              Location: [facility];  Service: Ophthalmology;                Laterality: Right;  IVA TOPICAL RIGHT 07/27/2016: COLONOSCOPY WITH PROPOFOL; N/A     Comment:  Procedure: COLONOSCOPY WITH PROPOFOL;  Surgeon: [name],  MD;  Location: [facility];  Service: Endoscopy;              Laterality: N/A; No date: HERNIA REPAIR 01/10/2016: MOHS SURGERY     Comment:  [place] for [facility]: TONSILLECTOMY AND ADENOIDECTOMY  BMI    Body Mass Index: 35.44 kg/m      Reproductive/Obstetrics negative OB ROS                             Anesthesia Physical Anesthesia Plan  ASA: II  Anesthesia Plan: General   Post-op Pain Management:    Induction:   PONV Risk Score and Plan: Propofol infusion and TIVA  Airway Management Planned: Nasal Cannula  Additional Equipment:   Intra-op Plan:   Post-operative Plan:   Informed Consent: I have reviewed the patients History and Physical, chart, labs and discussed the procedure including the risks, benefits and alternatives for the proposed anesthesia with the patient or authorized representative who has indicated his/her understanding and acceptance.     Dental Advisory Given  Plan Discussed with: Anesthesiologist, CRNA and Surgeon  Anesthesia Plan Comments:         Anesthesia Quick Evaluation

## 2020-05-02 NOTE — Anesthesia Postprocedure Evaluation (Signed)
Anesthesia Post Note  Patient: Jonathan Greer  Procedure(s) Performed: COLONOSCOPY WITH PROPOFOL (N/A )  Patient location during evaluation: PACU Anesthesia Type: General Level of consciousness: awake and alert Pain management: pain level controlled Vital Signs Assessment: post-procedure vital signs reviewed and stable Respiratory status: spontaneous breathing, nonlabored ventilation and respiratory function stable Cardiovascular status: blood pressure returned to baseline and stable Postop Assessment: no apparent nausea or vomiting Anesthetic complications: no   No complications documented.   Last Vitals:  Vitals:   05/02/20 0925 05/02/20 0935  BP: 115/80 117/78  Pulse:    Resp:    Temp:    SpO2:      Last Pain:  Vitals:   05/02/20 0935  TempSrc:   PainSc: 0-No pain                 Tera Mater

## 2020-05-02 NOTE — Transfer of Care (Signed)
Immediate Anesthesia Transfer of Care Note  Patient: Jonathan Greer  Procedure(s) Performed: Procedure(s): COLONOSCOPY WITH PROPOFOL (N/A)  Patient Location: PACU and Endoscopy Unit  Anesthesia Type:General  Level of Consciousness: sedated  Airway & Oxygen Therapy: Patient Spontanous Breathing and Patient connected to nasal cannula oxygen  Post-op Assessment: Report given to RN and Post -op Vital signs reviewed and stable  Post vital signs: Reviewed and stable  Last Vitals:  Vitals:   05/02/20 0742 05/02/20 0915  BP: 119/72 97/64  Pulse: 61 67  Resp: 16 16  Temp: (!) 35.9 C 36.7 C  SpO2: 38% 38%    Complications: No apparent anesthesia complications

## 2020-05-02 NOTE — Anesthesia Procedure Notes (Signed)
Date/Time: 05/02/2020 8:51 AM Performed by: Doreen Salvage, CRNA Pre-anesthesia Checklist: Patient identified, Emergency Drugs available, Suction available and Patient being monitored Patient Re-evaluated:Patient Re-evaluated prior to induction Oxygen Delivery Method: Nasal cannula Induction Type: IV induction Dental Injury: Teeth and Oropharynx as per pre-operative assessment  Comments: Nasal cannula with etCO2 monitoring

## 2020-05-02 NOTE — Op Note (Signed)
[facility] Patient Name: Jonathan Greer Procedure Date: 05/02/2020 8:46 AM MRN: [id number] Account #: [id number] Date of Birth: 10/01/1945 Admit Type: Outpatient Age: 74 Room: [facility] ENDO ROOM 4 Gender: Male Note Status: Finalized Procedure:             Colonoscopy Indications:           Surveillance: Personal history of colonic polyps                         (unknown histology) on last colonoscopy 3 years ago,                         Last colonoscopy: February 2018 Providers:             [name] MD, MD Referring MD:          Kirstie Peri. Caryn Section, MD (Referring MD) Medicines:             Monitored Anesthesia Care Complications:         No immediate complications. Procedure:             Pre-Anesthesia Assessment:                        - Prior to the procedure, a History and Physical was                         performed, and patient medications, allergies and                         sensitivities were reviewed. The patient's tolerance                         of previous anesthesia was reviewed.                        - The risks and benefits of the procedure and the                         sedation options and risks were discussed with the                         patient. All questions were answered and informed                         consent was obtained.                        - ASA Grade Assessment: II - A patient with mild                         systemic disease.                        After obtaining informed consent, the colonoscope was                         passed under direct vision. Throughout the procedure,                         the patient's blood pressure, pulse,  and oxygen                         saturations were monitored continuously. The                         Colonoscope was introduced through the anus and                         advanced to the the cecum, identified by the                         appendiceal orifice. The  colonoscopy was performed                         without difficulty. The patient tolerated the                         procedure well. The quality of the bowel preparation                         was good. Findings:      The perianal and digital rectal examinations were normal.      Multiple small-mouthed diverticula were found in the sigmoid colon.      A 7 mm polyp was found in the proximal transverse colon. The polyp was       sessile. The polyp was removed with a cold snare. Resection and       retrieval were complete.      A 15 mm polyp was found in the distal transverse colon. The polyp was       sessile. The polyp was removed with a piecemeal technique using a cold       snare. Resection and retrieval were complete. Polyp site at prior       polypectomy area, in vicinity of old tatoo      The exam was otherwise without abnormality on direct and retroflexion       views. Impression:            - Diverticulosis in the sigmoid colon.                        - One 7 mm polyp in the proximal transverse colon,                         removed with a cold snare. Resected and retrieved.                        - One 15 mm polyp in the distal transverse colon,                         removed piecemeal using a cold snare. Resected and                         retrieved.                        - The examination was otherwise normal on direct and  retroflexion views. Recommendation:        - Discharge patient to home (with escort).                        - Resume previous diet.                        - Continue present medications.                        - Await pathology results.                        - Repeat colonoscopy in 6 months for surveillance                         after piecemeal polypectomy. Procedure Code(s):     --- Professional ---                        [phone], Colonoscopy, flexible; with removal of                         tumor(s), polyp(s), or other  lesion(s) by snare                         technique Diagnosis Code(s):     --- Professional ---                        Z86.010, Personal history of colonic polyps                        K63.5, Polyp of colon                        K57.30, Diverticulosis of large intestine without                         perforation or abscess without bleeding CPT copyright 2019 American Medical Association. All rights reserved. The codes documented in this report are preliminary and upon coder review may  be revised to meet current compliance requirements. [name], MD [name] MD, MD 05/02/2020 9:14:35 AM This report has been signed electronically. Number of Addenda: 0 Note Initiated On: 05/02/2020 8:46 AM Scope Withdrawal Time: 0 hours 18 minutes 23 seconds  Total Procedure Duration: 0 hours 20 minutes 34 seconds  Estimated Blood Loss:  Estimated blood loss: none.      [facility]

## 2020-05-03 LAB — SURGICAL PATHOLOGY

## 2020-05-09 ENCOUNTER — Encounter: Payer: Self-pay | Admitting: Gastroenterology

## 2020-05-10 ENCOUNTER — Other Ambulatory Visit: Payer: Self-pay

## 2020-05-10 ENCOUNTER — Emergency Department
Admission: EM | Admit: 2020-05-10 | Discharge: 2020-05-10 | Disposition: A | Discharge status: Home / Self Care | Payer: Medicare Other | Attending: Emergency Medicine | Admitting: Emergency Medicine

## 2020-05-10 ENCOUNTER — Emergency Department: Payer: Medicare Other

## 2020-05-10 DIAGNOSIS — W010XXA Fall on same level from slipping, tripping and stumbling without subsequent striking against object, initial encounter: Secondary | ICD-10-CM | POA: Insufficient documentation

## 2020-05-10 DIAGNOSIS — S0101XA Laceration without foreign body of scalp, initial encounter: Secondary | ICD-10-CM | POA: Insufficient documentation

## 2020-05-10 DIAGNOSIS — J069 Acute upper respiratory infection, unspecified: Secondary | ICD-10-CM | POA: Insufficient documentation

## 2020-05-10 DIAGNOSIS — J3489 Other specified disorders of nose and nasal sinuses: Secondary | ICD-10-CM | POA: Diagnosis not present

## 2020-05-10 DIAGNOSIS — Z87891 Personal history of nicotine dependence: Secondary | ICD-10-CM | POA: Diagnosis not present

## 2020-05-10 DIAGNOSIS — J322 Chronic ethmoidal sinusitis: Secondary | ICD-10-CM | POA: Diagnosis not present

## 2020-05-10 DIAGNOSIS — G319 Degenerative disease of nervous system, unspecified: Secondary | ICD-10-CM | POA: Diagnosis not present

## 2020-05-10 MED ORDER — TRAMADOL HCL 50 MG PO TABS
50.0000 mg | ORAL_TABLET | Freq: Once | ORAL | Status: AC
  Administered 2020-05-10: 50 mg via ORAL
  Filled 2020-05-10: qty 1

## 2020-05-10 MED ORDER — LIDOCAINE-EPINEPHRINE-TETRACAINE (LET) TOPICAL GEL: 3.0000 mL | Freq: Once | TOPICAL | Status: DC

## 2020-05-10 MED ORDER — TRAMADOL HCL 50 MG PO TABS: 50.0000 mg | ORAL_TABLET | Freq: Two times a day (BID) | ORAL | 0 refills | Status: DC | PRN

## 2020-05-10 NOTE — ED Notes (Signed)
Trip and hit head on landscaping timber, denies loc, denies dizziness.  approx 2 in lac to left side of head, bleeding controlled.

## 2020-05-10 NOTE — ED Triage Notes (Addendum)
Tripped over landscape timber and fell and hit head on lawnmower handle. Laceration to left scalp. No LOC. Does not take blood thinners.  No pain, denies neck pain.

## 2020-05-10 NOTE — ED Provider Notes (Signed)
[facility] Emergency Department Provider Note   ____________________________________________   None    (approximate)  I have reviewed the triage vital signs and the nursing notes.   HISTORY  Chief Complaint Head Injury    HPI Jonathan Greer is a 74 y.o. male patient presents with a laceration to the superior lateral aspect of the left scalp.  Patient states he tripped and fell over last escape timbre and hit his head on the lawn mower handle.  Patient denies LOC.  Patient is not taking blood thinners.  Patient denies vision disturbance, weakness, or vertigo.  Denies pain at this time.  Bleeding controlled with direct pressure.         Past Medical History:  Diagnosis Date  . Basal cell carcinoma    nose  . Hyperlipemia 07/07/2015  . Hyperlipidemia   . Shingles 07/07/2015  . Wears dentures     Patient Active Problem List   Diagnosis Date Noted  . Prediabetes 08/26/2019  . Morbid obesity ([place]) 08/26/2019  . Hepatic steatosis 03/22/2019  . Gross hematuria 02/24/2018  . Complex renal cyst 02/24/2018  . Bimalleolar fracture of left ankle 06/25/2017  . Diverticulosis of large intestine without diverticulitis   . First degree hemorrhoids   . Hx of colonic polyps   . History of basal cell carcinoma of skin 01/17/2016  . 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 Hyperlipemia 07/07/2015  . 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 Kidney stone 07/07/2015    Past Surgical History:  Procedure Laterality Date  . CATARACT EXTRACTION W/PHACO Left 03/13/2017   Procedure: CATARACT EXTRACTION PHACO AND INTRAOCULAR LENS PLACEMENT ([place]) LEFT;  Surgeon: Leandrew Koyanagi, MD;  Location: [place];  Service: Ophthalmology;  Laterality: Left;  IVA TOPICAL LEFT  . CATARACT EXTRACTION W/PHACO Right 04/17/2017   Procedure: CATARACT EXTRACTION PHACO AND INTRAOCULAR LENS PLACEMENT (IOC);  Surgeon: Leandrew Koyanagi, MD;  Location: [place];  Service: Ophthalmology;  Laterality: Right;  IVA TOPICAL RIGHT  . COLONOSCOPY WITH PROPOFOL N/A 07/27/2016   Procedure: COLONOSCOPY WITH PROPOFOL;  Surgeon: [name], MD;  Location: [facility] ENDOSCOPY;  Service: Endoscopy;  Laterality: N/A;  . COLONOSCOPY WITH PROPOFOL N/A 05/02/2020   Procedure: COLONOSCOPY WITH PROPOFOL;  Surgeon: [name], MD;  Location: [facility] ENDOSCOPY;  Service: Gastroenterology;  Laterality: N/A;  . HERNIA REPAIR    . MOHS SURGERY  01/10/2016   [facility]  . TONSILLECTOMY AND ADENOIDECTOMY  1960's    Prior to Admission medications   Medication Sig Start Date End Date Taking? Authorizing Provider  pravastatin (PRAVACHOL) 40 MG tablet Take 1 tablet (40 mg total) by mouth daily. 03/22/20   Birdie Sons, MD  tamsulosin (FLOMAX) 0.4 MG CAPS capsule Take 1 capsule (0.4 mg total) by mouth daily. Patient not taking: Reported on 04/07/2020 01/03/18   Darel Hong, MD  traMADol (ULTRAM) 50 MG tablet Take 1 tablet (50 mg total) by mouth every 12 (twelve) hours as needed. 05/10/20   Sable Feil, PA-C    Allergies Patient has no known allergies.  Family History  Problem Relation Age of Onset  . Heart failure Mother   . CVA Father   . Lung cancer Brother   . Liver cancer Maternal Grandmother   . Cancer Paternal Grandmother   . Cancer Paternal Grandfather   . Bladder Cancer Neg Hx   . Prostate cancer Neg Hx   . Kidney cancer Neg Hx     Social History Social  History   Tobacco Use  . Smoking status: Former Smoker    Packs/day: 2.00    Years: 20.00    Pack years: 40.00    Types: Cigarettes    Quit date: 06/19/1975    Years since quitting: 44.9  . Smokeless tobacco: Former Systems developer    Types: Chew  . Tobacco comment: Quit in the 71s  Vaping Use  . Vaping Use: Never used  Substance Use Topics  . Alcohol use: No    Alcohol/week: 0.0 standard drinks  . Drug use: No    Review of Systems Constitutional: No fever/chills Eyes: No visual  changes. ENT: No sore throat. Cardiovascular: Denies chest pain. Respiratory: Denies shortness of breath. Gastrointestinal: No abdominal pain.  No nausea, no vomiting.  No diarrhea.  No constipation. Genitourinary: Negative for dysuria. Musculoskeletal: Negative for back pain. Skin: Negative for rash.  Scalp laceration. Neurological: Negative for headaches, focal weakness or numbness. Endocrine:  Hyperlipidemia and hypertension ____________________________________________   PHYSICAL EXAM:  VITAL SIGNS: ED Triage Vitals [05/10/20 1348]  Enc Vitals Group     BP (!) 162/79     Pulse Rate 65     Resp 18     Temp 98.4 F (36.9 C)     Temp Source Oral     SpO2 99 %     Weight 238 lb 1.6 oz (108 kg)     Height 5\' 9"  (1.753 m)     Head Circumference      Peak Flow      Pain Score 0     Pain Loc      Pain Edu?      Excl. in [place]?     Constitutional: Alert and oriented. Well appearing and in no acute distress. Eyes: Conjunctivae are normal. PERRL. EOMI. Head: Atraumatic. Nose: No congestion/rhinnorhea. Mouth/Throat: Mucous membranes are moist.  Oropharynx non-erythematous. Neck: No stridor.  No cervical spine tenderness to palpation. Hematological/Lymphatic/Immunilogical: No cervical lymphadenopathy. Cardiovascular: Normal rate, regular rhythm. Grossly normal heart sounds.  Good peripheral circulation.  Abated blood pressure. Respiratory: Normal respiratory effort.  No retractions. Lungs CTAB. Neurologic:  Normal speech and language. No gross focal neurologic deficits are appreciated. No gait instability. Skin:  Skin is warm, dry and intact. No rash noted.  Left scalp laceration Psychiatric: Mood and affect are normal. Speech and behavior are normal.  ____________________________________________   LABS (all labs ordered are listed, but only abnormal results are displayed)  Labs Reviewed - No data to  display ____________________________________________  EKG   ____________________________________________  RADIOLOGY I, Sable Feil, personally viewed and evaluated these images (plain radiographs) as part of my medical decision making, as well as reviewing the written report by the radiologist.  ED MD interpretation:    Official radiology report(s): CT Head Wo Contrast  Result Date: 05/10/2020 CLINICAL DATA:  Head trauma, minor. Additional history provided: Fall. EXAM: CT HEAD WITHOUT CONTRAST TECHNIQUE: Contiguous axial images were obtained from the base of the skull through the vertex without intravenous contrast. COMPARISON:  No pertinent prior exams available for comparison. FINDINGS: Brain: Mild generalized cerebral atrophy. There is no acute intracranial hemorrhage. No demarcated cortical infarct. No extra-axial fluid collection. No evidence of intracranial mass. No midline shift. Vascular: No hyperdense vessel.  Atherosclerotic calcifications Skull: Normal. Negative for fracture or focal lesion. Sinuses/Orbits: Visualized orbits show no acute finding. Mild mucosal thickening within the bilateral ethmoid and sphenoid sinuses. Other: Left parietal scalp laceration and hematoma. IMPRESSION: No evidence of acute intracranial abnormality. Left  parietal scalp laceration and hematoma. Mild generalized cerebral atrophy. Mild bilateral ethmoid and sphenoid sinus mucosal thickening. Electronically Signed   By: Kellie Simmering DO   On: 05/10/2020 15:22    ____________________________________________   PROCEDURES  Procedure(s) performed (including Critical Care):  Marland KitchenMarland KitchenLaceration Repair  Date/Time: 05/10/2020 4:25 PM Performed by: Sable Feil, PA-C Authorized by: Sable Feil, PA-C   Consent:    Consent obtained:  Verbal   Consent given by:  Patient   Risks discussed:  Infection, pain, poor cosmetic result, need for additional repair and poor wound healing Anesthesia (see MAR for  exact dosages):    Anesthesia method:  None Laceration details:    Location:  Scalp     ____________________________________________   INITIAL IMPRESSION / ASSESSMENT AND PLAN / ED COURSE  As part of my medical decision making, I reviewed the following data within the [place]         Patient presents with scalp laceration secondary to a trip and fall.  There was no LOC.  Discussed no acute findings on CT of the head.  See procedure note for wound closure.  Patient given discharge care instruction advised return back in 10 days for  staple removal.      ____________________________________________   FINAL CLINICAL IMPRESSION(S) / ED DIAGNOSES  Final diagnoses:  Laceration of scalp, initial encounter     ED Discharge Orders         Ordered    traMADol (ULTRAM) 50 MG tablet  Every 12 hours PRN        05/10/20 1622          *Please note:  LAMONTE HARTT was evaluated in Emergency Department on 05/10/2020 for the symptoms described in the history of present illness. He was evaluated in the context of the global COVID-19 pandemic, which necessitated consideration that the patient might be at risk for infection with the SARS-CoV-2 virus that causes COVID-19. Institutional protocols and algorithms that pertain to the evaluation of patients at risk for COVID-19 are in a state of rapid change based on information released by regulatory bodies including the CDC and federal and state organizations. These policies and algorithms were followed during the patient's care in the ED.  Some ED evaluations and interventions may be delayed as a result of limited staffing during and the pandemic.*   Note:  This document was prepared using Dragon voice recognition software and may include unintentional dictation errors.    Sable Feil, PA-C 05/10/20 1626    Duffy Bruce, MD 05/10/20 2110

## 2020-05-10 NOTE — Discharge Instructions (Signed)
Follow discharge care instruction have staples removed in 10 days.  CT scan was negative for intracranial bleed or fracture.  Advise extra strength Tylenol ibuprofen as needed for pain.

## 2020-05-10 NOTE — ED Notes (Signed)
Pt states he is very ready to leave and sick of waiting, states he understands the hold up but is ready to go home. Will eat on the way home, ride waiting for pt in lobby. Walked to lobby with no issues or distress, d/c instructions reviewed with pt.

## 2020-05-10 NOTE — ED Notes (Signed)
Site with staples intact, cleaned with NS.

## 2020-05-16 ENCOUNTER — Other Ambulatory Visit: Payer: Self-pay | Admitting: Family Medicine

## 2020-05-16 DIAGNOSIS — E785 Hyperlipidemia, unspecified: Secondary | ICD-10-CM

## 2020-05-20 DIAGNOSIS — Z4802 Encounter for removal of sutures: Secondary | ICD-10-CM | POA: Diagnosis not present

## 2020-05-20 DIAGNOSIS — S0101XD Laceration without foreign body of scalp, subsequent encounter: Secondary | ICD-10-CM | POA: Diagnosis not present

## 2020-05-30 DIAGNOSIS — J069 Acute upper respiratory infection, unspecified: Secondary | ICD-10-CM | POA: Diagnosis not present

## 2020-06-27 ENCOUNTER — Other Ambulatory Visit: Payer: Self-pay

## 2020-06-27 ENCOUNTER — Ambulatory Visit (INDEPENDENT_AMBULATORY_CARE_PROVIDER_SITE_OTHER): Payer: Medicare HMO | Admitting: Family Medicine

## 2020-06-27 ENCOUNTER — Encounter: Payer: Self-pay | Admitting: Family Medicine

## 2020-06-27 VITALS — BP 132/80 | HR 60 | Temp 98.1°F | Resp 16 | Wt 251.0 lb

## 2020-06-27 DIAGNOSIS — E785 Hyperlipidemia, unspecified: Secondary | ICD-10-CM | POA: Diagnosis not present

## 2020-06-27 MED ORDER — ATORVASTATIN CALCIUM 20 MG PO TABS: ORAL_TABLET | ORAL | 1 refills | Status: DC

## 2020-06-27 NOTE — Progress Notes (Signed)
Established patient visit   Patient: Jonathan Greer   DOB: 1945/10/14   75 y.o. Male  MRN: [id number] Visit Date: 06/27/2020  Today's healthcare provider: Lelon Huh, MD   Chief Complaint  Patient presents with  . Hyperlipidemia   Subjective    HPI  Lipid/Cholesterol, Follow-up  Last lipid panel Other pertinent labs  Lab Results  Component Value Date   CHOL 209 (H) 03/21/2020   HDL 58 03/21/2020   LDLCALC 130 (H) 03/21/2020   TRIG 116 03/21/2020   CHOLHDL 3.6 03/21/2020   Lab Results  Component Value Date   ALT 39 03/21/2020   AST 36 03/21/2020   PLT 208 03/21/2020   TSH 1.970 06/28/2016     He was last seen for this 3 months ago.  Management since that visit includes starting pravastatin 40mg  once tablet daily.  He reports poor compliance with treatment. Patient stopped taking Pravastatin 4 weeks ago due to it causing hip pain. He is having side effects. Hip pain  Symptoms: No chest pain No chest pressure/discomfort  No dyspnea No lower extremity edema  No numbness or tingling of extremity No orthopnea  No palpitations No paroxysmal nocturnal dyspnea  No speech difficulty No syncope   Current diet: in general, an "unhealthy" diet Current exercise: walking  The 10-year ASCVD risk score Mikey Bussing DC Jr., et al., 2013) is: 23.6%  ---------------------------------------------------------------------------------------------------     Medications: Outpatient Medications Prior to Visit  Medication Sig  . pravastatin (PRAVACHOL) 40 MG tablet Take 1 tablet (40 mg total) by mouth daily. (Patient not taking: Reported on 06/27/2020)  . tamsulosin (FLOMAX) 0.4 MG CAPS capsule Take 1 capsule (0.4 mg total) by mouth daily. (Patient not taking: No sig reported)  . traMADol (ULTRAM) 50 MG tablet Take 1 tablet (50 mg total) by mouth every 12 (twelve) hours as needed. (Patient not taking: Reported on 06/27/2020)   No facility-administered medications prior to  visit.    Review of Systems  Constitutional: Negative for appetite change, chills and fever.  Respiratory: Negative for chest tightness, shortness of breath and wheezing.   Cardiovascular: Negative for chest pain and palpitations.  Gastrointestinal: Negative for abdominal pain, nausea and vomiting.      Objective    BP 132/80 (BP Location: Left Arm, Patient Position: Sitting, Cuff Size: Large)   Pulse 60   Temp 98.1 F (36.7 C) (Temporal)   Resp 16   Wt 251 lb (113.9 kg)   BMI 37.07 kg/m    Physical Exam   General appearance: Obese male, cooperative and in no acute distress Head: Normocephalic, without obvious abnormality, atraumatic Respiratory: Respirations even and unlabored, normal respiratory rate Extremities: All extremities are intact.  Skin: Skin color, texture, turgor normal. No rashes seen  Psych: Appropriate mood and affect. Neurologic: Mental status: Alert, oriented to person, place, and time, thought content appropriate.     Assessment & Plan     1. Hyperlipidemia, unspecified hyperlipidemia type Intolerant to daily pravastatin due to hip pain which resolved after stopping medications.  Will try - atorvastatin (LIPITOR) 20 MG tablet; Take one tablet three days a week  Dispense: 30 tablet; Refill: 1   Follow up 3 months to check on lipids and A1c.       The entirety of the information documented in the History of Present Illness, Review of Systems and Physical Exam were personally obtained by me. Portions of this information were initially documented by the [place] and reviewed by me  for thoroughness and accuracy.      Lelon Huh, MD  [facility] [phone] (phone) [phone] (fax)  [place]

## 2020-07-11 ENCOUNTER — Emergency Department: Payer: Medicare HMO

## 2020-07-11 ENCOUNTER — Other Ambulatory Visit: Payer: Self-pay

## 2020-07-11 ENCOUNTER — Emergency Department
Admission: EM | Admit: 2020-07-11 | Discharge: 2020-07-11 | Disposition: A | Discharge status: Home / Self Care | Payer: Medicare HMO | Attending: Student in an Organized Health Care Education/Training Program | Admitting: Student in an Organized Health Care Education/Training Program

## 2020-07-11 ENCOUNTER — Encounter: Payer: Self-pay | Admitting: Emergency Medicine

## 2020-07-11 DIAGNOSIS — Z87891 Personal history of nicotine dependence: Secondary | ICD-10-CM | POA: Diagnosis not present

## 2020-07-11 DIAGNOSIS — R519 Headache, unspecified: Secondary | ICD-10-CM | POA: Diagnosis not present

## 2020-07-11 DIAGNOSIS — Z85828 Personal history of other malignant neoplasm of skin: Secondary | ICD-10-CM | POA: Insufficient documentation

## 2020-07-11 DIAGNOSIS — W01198A Fall on same level from slipping, tripping and stumbling with subsequent striking against other object, initial encounter: Secondary | ICD-10-CM | POA: Insufficient documentation

## 2020-07-11 NOTE — ED Notes (Signed)
Patient verbalizes understanding of discharge instructions. Opportunity for questioning and answers were provided. Armband removed by staff, pt discharged from ED. Ambulated out to lobby  

## 2020-07-11 NOTE — ED Triage Notes (Signed)
Patient to ER for c/o headache only at night. States he fell two months ago and hit head, has had headaches for approx one week. States he also started taking Lipitor approx one week ago (Lipitor 20mg  tid).

## 2020-07-11 NOTE — Discharge Instructions (Addendum)

## 2020-07-11 NOTE — ED Notes (Signed)
First Nurse Note: Pt to ED stating that he wants a CT of his head done. Pt states that he fell 2 months ago and has been having throbbing pain in his head at night. Pt is ambulatory without difficulty or distress. Pt is in NAD.

## 2020-07-11 NOTE — ED Provider Notes (Signed)
[facility] Emergency Department Provider Note    Event Date/Time   First MD Initiated Contact with Patient 07/11/20 [phone]     (approximate)  I have reviewed the triage vital signs and the nursing notes.   HISTORY  Chief Complaint Headache    HPI Jonathan Greer is a 75 y.o. male below listed past medical history presents to the ER for evaluation of left-sided headache that occurs nightly since he fell and hit his head.  Did have to have staples placed there.  States the pain is mild no associated numbness or tingling.  No blurry vision.  No nausea or vomiting.  No fevers.  Past Medical History:  Diagnosis Date  . Basal cell carcinoma    nose  . Hyperlipemia 07/07/2015  . Hyperlipidemia   . Shingles 07/07/2015  . Wears dentures    Family History  Problem Relation Age of Onset  . Heart failure Mother   . CVA Father   . Lung cancer Brother   . Liver cancer Maternal Grandmother   . Cancer Paternal Grandmother   . Cancer Paternal Grandfather   . Bladder Cancer Neg Hx   . Prostate cancer Neg Hx   . Kidney cancer Neg Hx    Past Surgical History:  Procedure Laterality Date  . CATARACT EXTRACTION W/PHACO Left 03/13/2017   Procedure: CATARACT EXTRACTION PHACO AND INTRAOCULAR LENS PLACEMENT ([place]) LEFT;  Surgeon: Leandrew Koyanagi, MD;  Location: [place];  Service: Ophthalmology;  Laterality: Left;  IVA TOPICAL LEFT  . CATARACT EXTRACTION W/PHACO Right 04/17/2017   Procedure: CATARACT EXTRACTION PHACO AND INTRAOCULAR LENS PLACEMENT (IOC);  Surgeon: Leandrew Koyanagi, MD;  Location: [place];  Service: Ophthalmology;  Laterality: Right;  IVA TOPICAL RIGHT  . COLONOSCOPY WITH PROPOFOL N/A 07/27/2016   Procedure: COLONOSCOPY WITH PROPOFOL;  Surgeon: [name], MD;  Location: [facility] ENDOSCOPY;  Service: Endoscopy;  Laterality: N/A;  . COLONOSCOPY WITH PROPOFOL N/A 05/02/2020   Procedure: COLONOSCOPY WITH PROPOFOL;  Surgeon: [name], MD;  Location: [facility] ENDOSCOPY;  Service: Gastroenterology;  Laterality: N/A;  . HERNIA REPAIR    . MOHS SURGERY  01/10/2016   [facility]  . TONSILLECTOMY AND ADENOIDECTOMY  1960's   Patient Active Problem List   Diagnosis Date Noted  . Prediabetes 08/26/2019  . Morbid obesity ([place]) 08/26/2019  . Hepatic steatosis 03/22/2019  . Gross hematuria 02/24/2018  . Complex renal cyst 02/24/2018  . Bimalleolar fracture of left ankle 06/25/2017  . Diverticulosis of large intestine without diverticulitis   . First degree hemorrhoids   . Hx of colonic polyps   . History of basal cell carcinoma of skin 01/17/2016  . 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 Hyperlipemia 07/07/2015  . 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 Kidney stone 07/07/2015      Prior to Admission medications   Medication Sig Start Date End Date Taking? Authorizing Provider  atorvastatin (LIPITOR) 20 MG tablet Take one tablet three days a week 06/27/20   Birdie Sons, MD    Allergies Pravastatin    Social History Social History   Tobacco Use  . Smoking status: Former Smoker    Packs/day: 2.00    Years: 20.00    Pack years: 40.00    Types: Cigarettes    Quit date: 06/19/1975    Years since quitting: 45.0  . Smokeless tobacco: Former Systems developer    Types: Chew  . Tobacco comment: Quit in the 12s  Vaping Use  .  Vaping Use: Never used  Substance Use Topics  . Alcohol use: No    Alcohol/week: 0.0 standard drinks  . Drug use: No    Review of Systems Patient denies headaches, rhinorrhea, blurry vision, numbness, shortness of breath, chest pain, edema, cough, abdominal pain, nausea, vomiting, diarrhea, dysuria, fevers, rashes or hallucinations unless otherwise stated above in HPI. ____________________________________________   PHYSICAL EXAM:  VITAL SIGNS: Vitals:   07/11/20 0754  BP: 139/89  Pulse: 67  Resp: 20  Temp: 97.6 F (36.4 C)  SpO2: 98%    Constitutional: Alert and oriented.  Well appearing and in no acute distress. Eyes: Conjunctivae are normal.  Head: Atraumatic.  Well healing left parietal lac Nose: No congestion/rhinnorhea. Mouth/Throat: Mucous membranes are moist.   Neck: Painless ROM.  Cardiovascular:   Good peripheral circulation. Respiratory: Normal respiratory effort.  No retractions.  Gastrointestinal: Soft and nontender.  Musculoskeletal: No lower extremity tenderness .  No joint effusions. Neurologic:  Normal speech and language. No gross focal neurologic deficits are appreciated.  Skin:  Skin is warm, dry and intact. No rash noted. Psychiatric: Mood and affect are normal. Speech and behavior are normal.  ____________________________________________   LABS (all labs ordered are listed, but only abnormal results are displayed)  No results found for this or any previous visit (from the past 24 hour(s)). ____________________________________________ ____________________________________________  [id number]  I personally reviewed all radiographic images ordered to evaluate for the above acute complaints and reviewed radiology reports and findings.  These findings were personally discussed with the patient.  Please see medical record for radiology report.   ____________________________________________   PROCEDURES  Procedure(s) performed:  Procedures    Critical Care performed: no ____________________________________________   INITIAL IMPRESSION / ASSESSMENT AND PLAN / ED COURSE  Pertinent labs & imaging results that were available during my care of the patient were reviewed by me and considered in my medical decision making (see chart for details).  DDX: Contusion, swelling, mass, infection, tension, migraine  Jonathan Greer is a 75 y.o. who presents to the ED with presentation as described above.  Patient clinically well-appearing neuro intact.  CT imaging ordered at triage is reassuring.  At this point do believe he is stable  and appropriate for outpatient follow-up.  He currently feels well and has no other concerns.      ____________________________________________   FINAL CLINICAL IMPRESSION(S) / ED DIAGNOSES  Final diagnoses:  Nonintractable headache, unspecified chronicity pattern, unspecified headache type      NEW MEDICATIONS STARTED DURING THIS VISIT:  New Prescriptions   No medications on file     Note:  This document was prepared using Dragon voice recognition software and may include unintentional dictation errors.     Merlyn Lot, MD 07/11/20 1040

## 2020-08-11 ENCOUNTER — Telehealth: Payer: Self-pay | Admitting: Family Medicine

## 2020-08-11 NOTE — Telephone Encounter (Signed)
Copied from [place] [id number]. Topic: Medicare AWV >> Aug 11, 2020  4:18 PM Weston Anna wrote: Reason for CRM:  Left message to notify AWVS schedule on March 17th at 9am has been rescheduled to complete by phone (not in office) same day but at 2:00pm

## 2020-08-29 DIAGNOSIS — H903 Sensorineural hearing loss, bilateral: Secondary | ICD-10-CM | POA: Diagnosis not present

## 2020-08-31 NOTE — Progress Notes (Signed)
Subjective:   Jonathan Greer is a 75 y.o. male who presents for Medicare Annual/Subsequent preventive examination.  I connected with Quentin Mulling today by telephone and verified that I am speaking with the correct person using two identifiers. Location patient: home Location provider: work Persons participating in the virtual visit: patient, provider.   I discussed the limitations, risks, security and privacy concerns of performing an evaluation and management service by telephone and the availability of in person appointments. I also discussed with the patient that there may be a patient responsible charge related to this service. The patient expressed understanding and verbally consented to this telephonic visit.    Interactive audio and video telecommunications were attempted between this provider and patient, however failed, due to patient having technical difficulties OR patient did not have access to video capability.  We continued and completed visit with audio only.   Review of Systems    N/A  Cardiac Risk Factors include: advanced age (>42men, >48 women);obesity (BMI >30kg/m2);male gender;dyslipidemia     Objective:    There were no vitals filed for this visit. There is no height or weight on file to calculate BMI.  Advanced Directives 09/01/2020 07/11/2020 05/10/2020 05/02/2020 08/31/2019 08/28/2018 01/03/2018  Does Patient Have a Medical Advance Directive? No No No No No Yes No  Type of Advance Directive - - - - - Press photographer;Living will -  Copy of [place] in Chart? - - - - - No - copy requested -  Would patient like information on creating a medical advance directive? No - Patient declined No - Patient declined - - No - Patient declined - -    Current Medications (verified) Outpatient Encounter Medications as of 09/01/2020  Medication Sig  . atorvastatin (LIPITOR) 20 MG tablet Take one tablet three days a week   No  facility-administered encounter medications on file as of 09/01/2020.    Allergies (verified) Pravastatin   History: Past Medical History:  Diagnosis Date  . Basal cell carcinoma    nose  . Hyperlipemia 07/07/2015  . Hyperlipidemia   . Shingles 07/07/2015  . Wears dentures    Past Surgical History:  Procedure Laterality Date  . CATARACT EXTRACTION W/PHACO Left 03/13/2017   Procedure: CATARACT EXTRACTION PHACO AND INTRAOCULAR LENS PLACEMENT ([place]) LEFT;  Surgeon: Leandrew Koyanagi, MD;  Location: [place];  Service: Ophthalmology;  Laterality: Left;  IVA TOPICAL LEFT  . CATARACT EXTRACTION W/PHACO Right 04/17/2017   Procedure: CATARACT EXTRACTION PHACO AND INTRAOCULAR LENS PLACEMENT (IOC);  Surgeon: Leandrew Koyanagi, MD;  Location: [place];  Service: Ophthalmology;  Laterality: Right;  IVA TOPICAL RIGHT  . COLONOSCOPY WITH PROPOFOL N/A 07/27/2016   Procedure: COLONOSCOPY WITH PROPOFOL;  Surgeon: [name], MD;  Location: [facility] ENDOSCOPY;  Service: Endoscopy;  Laterality: N/A;  . COLONOSCOPY WITH PROPOFOL N/A 05/02/2020   Procedure: COLONOSCOPY WITH PROPOFOL;  Surgeon: [name], MD;  Location: [facility] ENDOSCOPY;  Service: Gastroenterology;  Laterality: N/A;  . HERNIA REPAIR    . MOHS SURGERY  01/10/2016   [facility]  . TONSILLECTOMY AND ADENOIDECTOMY  1960's   Family History  Problem Relation Age of Onset  . Heart failure Mother   . CVA Father   . Lung cancer Brother   . Liver cancer Maternal Grandmother   . Cancer Paternal Grandmother   . Cancer Paternal Grandfather   . Bladder Cancer Neg Hx   . Prostate cancer Neg Hx   . Kidney cancer  Neg Hx    Social History   Socioeconomic History  . Marital status: Married    Spouse name: Not on file  . Number of children: 0  . Years of education: Not on file  . Highest education level: 10th grade  Occupational History  . Occupation: Retired  Tobacco Use  . Smoking status: Former Smoker     Packs/day: 2.00    Years: 20.00    Pack years: 40.00    Types: Cigarettes    Quit date: 06/19/1975    Years since quitting: 45.2  . Smokeless tobacco: Former Systems developer    Types: Chew  . Tobacco comment: Quit in the 35s  Vaping Use  . Vaping Use: Never used  Substance and Sexual Activity  . Alcohol use: No    Alcohol/week: 0.0 standard drinks  . Drug use: No  . Sexual activity: Not on file  Other Topics Concern  . Not on file  Social History Narrative   Working 5 days a week in [employer]. (06/2016)   Social Determinants of Health   Financial Resource Strain: Low Risk   . Difficulty of Paying Living Expenses: Not hard at all  Food Insecurity: No Food Insecurity  . Worried About Charity fundraiser in the Last Year: Never true  . Ran Out of Food in the Last Year: Never true  Transportation Needs: No Transportation Needs  . Lack of Transportation (Medical): No  . Lack of Transportation (Non-Medical): No  Physical Activity: Inactive  . Days of Exercise per Week: 0 days  . Minutes of Exercise per Session: 0 min  Stress: No Stress Concern Present  . Feeling of Stress : Not at all  Social Connections: Moderately Isolated  . Frequency of Communication with Friends and Family: More than three times a week  . Frequency of Social Gatherings with Friends and Family: More than three times a week  . Attends Religious Services: Never  . Active Member of Clubs or Organizations: No  . Attends Archivist Meetings: Never  . Marital Status: Married    Tobacco Counseling Counseling given: Not Answered Comment: Quit in the 70s   Clinical Intake:  Pre-visit preparation completed: Yes  Pain : No/denies pain     Nutritional Risks: None Diabetes:  (Prediabetic)  How often do you need to have someone help you when you read instructions, pamphlets, or other written materials from your doctor or pharmacy?: 1 - Never  Diabetic? No  Interpreter Needed?: No  Information entered  by :: [facility], LPN   Activities of Daily Living In your present state of health, do you have any difficulty performing the following activities: 09/01/2020 03/21/2020  Hearing? Tempie Donning  Comment Currently awaiting a pair of hearing aids. -  Vision? N N  Difficulty concentrating or making decisions? N N  Walking or climbing stairs? N N  Dressing or bathing? N N  Doing errands, shopping? N N  Preparing Food and eating ? N -  Using the Toilet? N -  In the past six months, have you accidently leaked urine? N -  Do you have problems with loss of bowel control? N -  Managing your Medications? N -  Managing your Finances? N -  Housekeeping or managing your Housekeeping? N -  Some recent data might be hidden    Patient Care Team: Birdie Sons, MD as PCP - General (Family Medicine) [place], [place] as Consulting Physician (Optometry) [name], MD as Consulting Physician (Gastroenterology)  Tamsen Meek, MD as Referring Physician (Dermatology)  Indicate any recent Medical Services you may have received from other than [facility] providers in the past year (date may be approximate).     Assessment:   This is a routine wellness examination for [place].  Hearing/Vision screen No exam data present  Dietary issues and exercise activities discussed: Current Exercise Habits: The patient has a physically strenuous job, but has no regular exercise apart from work., Exercise limited by: None identified  Goals    . Diet      Recommend to continue with current diet plan of cutting out all foods that are white (focusing on carbs and sugars).     Marland Kitchen DIET - INCREASE WATER INTAKE     Recommend to drink at least 6-8 8oz glasses of water per day.    . Prevent falls     Recommend to remove any items from the home that may cause slips or trips.      Depression Screen PHQ 2/9 Scores 09/01/2020 03/21/2020 08/31/2019 08/28/2018 08/28/2018 07/19/2017 07/19/2017  PHQ - 2 Score 0 0 0 0 0 0 0  PHQ-  9 Score - 0 - 0 - 0 -    Fall Risk Fall Risk  09/01/2020 03/21/2020 08/31/2019 08/26/2019 08/28/2018  Falls in the past year? 1 0 0 0 0  Comment accidental trip - - - -  Number falls in past yr: 0 0 0 0 -  Comment - - - - -  Injury with Fall? 1 0 0 0 -  Comment - - - - -  Follow up Falls prevention discussed Falls evaluation completed - Falls evaluation completed -    FALL RISK PREVENTION PERTAINING TO THE HOME:  Any stairs in or around the home? Yes  If so, are there any without handrails? Yes  Home free of loose throw rugs in walkways, pet beds, electrical cords, etc? Yes  Adequate lighting in your home to reduce risk of falls? Yes   ASSISTIVE DEVICES UTILIZED TO PREVENT FALLS:  Life alert? No  Use of a cane, walker or w/c? No  Grab bars in the bathroom? No  Shower chair or bench in shower? No  Elevated toilet seat or a handicapped toilet? Yes    Cognitive Function: Normal cognitive status assessed by observation by this Nurse Health Advisor. No abnormalities found.       6CIT Screen 08/28/2018  What Year? 0 points  What month? 0 points  What time? 0 points  Count back from 20 0 points  Months in reverse 0 points  Repeat phrase 0 points  Total Score 0    Immunizations Immunization History  Administered Date(s) Administered  . Fluad Quad(high Dose 65+) 03/20/2019, 03/21/2020  . Influenza, High Dose Seasonal PF 06/27/2016, 04/26/2017, 05/02/2018  . Pneumococcal Conjugate-13 10/07/2014  . Pneumococcal Polysaccharide-23 03/18/2013  . Tdap 06/30/2010, 01/15/2017    TDAP status: Up to date  Flu Vaccine status: Up to date  Pneumococcal vaccine status: Up to date  Covid-19 vaccine status: Declined, Education has been provided regarding the importance of this vaccine but patient still declined. Advised may receive this vaccine at local pharmacy or Health Dept.or vaccine clinic. Aware to provide a copy of the vaccination record if obtained from local pharmacy or Health  Dept. Verbalized acceptance and understanding.  Qualifies for Shingles Vaccine? Yes   Zostavax completed No   Shingrix Completed?: No.    Education has been provided regarding the importance of this vaccine. Patient  has been advised to call insurance company to determine out of pocket expense if they have not yet received this vaccine. Advised may also receive vaccine at local pharmacy or Health Dept. Verbalized acceptance and understanding.  Screening Tests Health Maintenance  Topic Date Due  . COVID-19 Vaccine (1) 09/17/2020 (Originally 02/09/1958)  . COLONOSCOPY (Pts 45-63yrs Insurance coverage will need to be confirmed)  05/02/2021  . TETANUS/TDAP  01/16/2027  . INFLUENZA VACCINE  Completed  . Hepatitis C Screening  Completed  . PNA vac Low Risk Adult  Completed  . HPV VACCINES  Aged Out    Health Maintenance  There are no preventive care reminders to display for this patient.  Colorectal cancer screening: Type of screening: Colonoscopy. Completed 05/02/20. Repeat every year.  Lung Cancer Screening: (Low Dose CT Chest recommended if Age 27-80 years, 30 pack-year currently smoking OR have quit w/in 15years.) does not qualify.   Additional Screening:  Hepatitis C Screening: Up to date  Vision Screening: Recommended annual ophthalmology exams for early detection of glaucoma and other disorders of the eye. Is the patient up to date with their annual eye exam?  Yes  Who is the provider or what is the name of the office in which the patient attends annual eye exams? [place] If pt is not established with a provider, would they like to be referred to a provider to establish care? No .   Dental Screening: Recommended annual dental exams for proper oral hygiene  Community Resource Referral / Chronic Care Management: CRR required this visit?  No   CCM required this visit?  No      Plan:     I have personally reviewed and noted the following in the patient's chart:   . Medical and  social history . Use of alcohol, tobacco or illicit drugs  . Current medications and supplements . Functional ability and status . Nutritional status . Physical activity . Advanced directives . List of other physicians . Hospitalizations, surgeries, and ER visits in previous 12 months . Vitals . Screenings to include cognitive, depression, and falls . Referrals and appointments  In addition, I have reviewed and discussed with patient certain preventive protocols, quality metrics, and best practice recommendations. A written personalized care plan for preventive services as well as general preventive health recommendations were provided to patient.     Jonathan Greer, [place]   2/48/2500   Nurse Notes: Declined receiving a future Covid vaccine.

## 2020-09-01 ENCOUNTER — Other Ambulatory Visit: Payer: Self-pay

## 2020-09-01 ENCOUNTER — Ambulatory Visit (INDEPENDENT_AMBULATORY_CARE_PROVIDER_SITE_OTHER): Payer: Medicare HMO

## 2020-09-01 DIAGNOSIS — Z0000 Encounter for general adult medical examination without abnormal findings: Secondary | ICD-10-CM | POA: Diagnosis not present

## 2020-09-01 NOTE — Patient Instructions (Signed)
Jonathan Greer , Thank you for taking time to come for your Medicare Wellness Visit. I appreciate your ongoing commitment to your health goals. Please review the following plan we discussed and let me know if I can assist you in the future.   Screening recommendations/referrals: Colonoscopy: Up to date, due 04/2021 Recommended yearly ophthalmology/optometry visit for glaucoma screening and checkup Recommended yearly dental visit for hygiene and checkup  Vaccinations: Influenza vaccine: Done 03/21/20 Pneumococcal vaccine: Completed series Tdap vaccine: Up to date, due 12/2026 Shingles vaccine: Shingrix discussed. Please contact your pharmacy for coverage information.     Advanced directives: Advance directive discussed with you today. Even though you declined this today please call our office should you change your mind and we can give you the proper paperwork for you to fill out.  Conditions/risks identified: Fall risk preventatives discussed today. Recommend to drink at least 6-8 8oz glasses of water per day an avoid sugars and carbohydrates as able to.   Next appointment: 09/27/20 @ 8:40 AM with Dr Caryn Section   Preventive Care 50 Years and Older, Male Preventive care refers to lifestyle choices and visits with your health care provider that can promote health and wellness. What does preventive care include?  A yearly physical exam. This is also called an annual well check.  Dental exams once or twice a year.  Routine eye exams. Ask your health care provider how often you should have your eyes checked.  Personal lifestyle choices, including:  Daily care of your teeth and gums.  Regular physical activity.  Eating a healthy diet.  Avoiding tobacco and drug use.  Limiting alcohol use.  Practicing safe sex.  Taking low doses of aspirin every day.  Taking vitamin and mineral supplements as recommended by your health care provider. What happens during an annual well check? The  services and screenings done by your health care provider during your annual well check will depend on your age, overall health, lifestyle risk factors, and family history of disease. Counseling  Your health care provider may ask you questions about your:  Alcohol use.  Tobacco use.  Drug use.  Emotional well-being.  Home and relationship well-being.  Sexual activity.  Eating habits.  History of falls.  Memory and ability to understand (cognition).  Work and work Statistician. Screening  You may have the following tests or measurements:  Height, weight, and BMI.  Blood pressure.  Lipid and cholesterol levels. These may be checked every 5 years, or more frequently if you are over 28 years old.  Skin check.  Lung cancer screening. You may have this screening every year starting at age 78 if you have a 30-pack-year history of smoking and currently smoke or have quit within the past 15 years.  Fecal occult blood test (FOBT) of the stool. You may have this test every year starting at age 82.  Flexible sigmoidoscopy or colonoscopy. You may have a sigmoidoscopy every 5 years or a colonoscopy every 10 years starting at age 44.  Prostate cancer screening. Recommendations will vary depending on your family history and other risks.  Hepatitis C blood test.  Hepatitis B blood test.  Sexually transmitted disease (STD) testing.  Diabetes screening. This is done by checking your blood sugar (glucose) after you have not eaten for a while (fasting). You may have this done every 1-3 years.  Abdominal aortic aneurysm (AAA) screening. You may need this if you are a current or former smoker.  Osteoporosis. You may be screened starting at  age 73 if you are at high risk. Talk with your health care provider about your test results, treatment options, and if necessary, the need for more tests. Vaccines  Your health care provider may recommend certain vaccines, such as:  Influenza  vaccine. This is recommended every year.  Tetanus, diphtheria, and acellular pertussis (Tdap, Td) vaccine. You may need a Td booster every 10 years.  Zoster vaccine. You may need this after age 85.  Pneumococcal 13-valent conjugate (PCV13) vaccine. One dose is recommended after age 64.  Pneumococcal polysaccharide (PPSV23) vaccine. One dose is recommended after age 1. Talk to your health care provider about which screenings and vaccines you need and how often you need them. This information is not intended to replace advice given to you by your health care provider. Make sure you discuss any questions you have with your health care provider. Document Released: 07/01/2015 Document Revised: 02/22/2016 Document Reviewed: 04/05/2015 Elsevier Interactive Patient Education  2017 [place] Prevention in the Home Falls can cause injuries. They can happen to people of all ages. There are many things you can do to make your home safe and to help prevent falls. What can I do on the outside of my home?  Regularly fix the edges of walkways and driveways and fix any cracks.  Remove anything that might make you trip as you walk through a door, such as a raised step or threshold.  Trim any bushes or trees on the path to your home.  Use bright outdoor lighting.  Clear any walking paths of anything that might make someone trip, such as rocks or tools.  Regularly check to see if handrails are loose or broken. Make sure that both sides of any steps have handrails.  Any raised decks and porches should have guardrails on the edges.  Have any leaves, snow, or ice cleared regularly.  Use sand or salt on walking paths during winter.  Clean up any spills in your garage right away. This includes oil or grease spills. What can I do in the bathroom?  Use night lights.  Install grab bars by the toilet and in the tub and shower. Do not use towel bars as grab bars.  Use non-skid mats or decals  in the tub or shower.  If you need to sit down in the shower, use a plastic, non-slip stool.  Keep the floor dry. Clean up any water that spills on the floor as soon as it happens.  Remove soap buildup in the tub or shower regularly.  Attach bath mats securely with double-sided non-slip rug tape.  Do not have throw rugs and other things on the floor that can make you trip. What can I do in the bedroom?  Use night lights.  Make sure that you have a light by your bed that is easy to reach.  Do not use any sheets or blankets that are too big for your bed. They should not hang down onto the floor.  Have a firm chair that has side arms. You can use this for support while you get dressed.  Do not have throw rugs and other things on the floor that can make you trip. What can I do in the kitchen?  Clean up any spills right away.  Avoid walking on wet floors.  Keep items that you use a lot in easy-to-reach places.  If you need to reach something above you, use a strong step stool that has a grab bar.  Keep electrical cords out of the way.  Do not use floor polish or wax that makes floors slippery. If you must use wax, use non-skid floor wax.  Do not have throw rugs and other things on the floor that can make you trip. What can I do with my stairs?  Do not leave any items on the stairs.  Make sure that there are handrails on both sides of the stairs and use them. Fix handrails that are broken or loose. Make sure that handrails are as long as the stairways.  Check any carpeting to make sure that it is firmly attached to the stairs. Fix any carpet that is loose or worn.  Avoid having throw rugs at the top or bottom of the stairs. If you do have throw rugs, attach them to the floor with carpet tape.  Make sure that you have a light switch at the top of the stairs and the bottom of the stairs. If you do not have them, ask someone to add them for you. What else can I do to help  prevent falls?  Wear shoes that:  Do not have high heels.  Have rubber bottoms.  Are comfortable and fit you well.  Are closed at the toe. Do not wear sandals.  If you use a stepladder:  Make sure that it is fully opened. Do not climb a closed stepladder.  Make sure that both sides of the stepladder are locked into place.  Ask someone to hold it for you, if possible.  Clearly mark and make sure that you can see:  Any grab bars or handrails.  First and last steps.  Where the edge of each step is.  Use tools that help you move around (mobility aids) if they are needed. These include:  Canes.  Walkers.  Scooters.  Crutches.  Turn on the lights when you go into a dark area. Replace any light bulbs as soon as they burn out.  Set up your furniture so you have a clear path. Avoid moving your furniture around.  If any of your floors are uneven, fix them.  If there are any pets around you, be aware of where they are.  Review your medicines with your doctor. Some medicines can make you feel dizzy. This can increase your chance of falling. Ask your doctor what other things that you can do to help prevent falls. This information is not intended to replace advice given to you by your health care provider. Make sure you discuss any questions you have with your health care provider. Document Released: 03/31/2009 Document Revised: 11/10/2015 Document Reviewed: 07/09/2014 Elsevier Interactive Patient Education  2017 [employer].

## 2020-09-13 DIAGNOSIS — H903 Sensorineural hearing loss, bilateral: Secondary | ICD-10-CM | POA: Diagnosis not present

## 2020-09-27 ENCOUNTER — Encounter: Payer: Self-pay | Admitting: Family Medicine

## 2020-09-27 ENCOUNTER — Ambulatory Visit (INDEPENDENT_AMBULATORY_CARE_PROVIDER_SITE_OTHER): Payer: Medicare HMO | Admitting: Family Medicine

## 2020-09-27 ENCOUNTER — Other Ambulatory Visit: Payer: Self-pay

## 2020-09-27 VITALS — BP 136/82 | HR 89 | Temp 97.9°F | Resp 16 | Ht 69.0 in | Wt 254.0 lb

## 2020-09-27 DIAGNOSIS — E785 Hyperlipidemia, unspecified: Secondary | ICD-10-CM | POA: Diagnosis not present

## 2020-09-27 DIAGNOSIS — R7303 Prediabetes: Secondary | ICD-10-CM

## 2020-09-27 LAB — POCT GLYCOSYLATED HEMOGLOBIN (HGB A1C)
Est. average glucose Bld gHb Est-mCnc: 131
Hemoglobin A1C: 6.1 % — AB (ref 4.0–5.6)

## 2020-09-27 NOTE — Progress Notes (Signed)
Established patient visit   Patient: Jonathan Greer   DOB: [birth year]   75 y.o. Male  MRN: [id number] Visit Date: 09/27/2020  Today's healthcare provider: Lelon Huh, MD   Chief Complaint  Patient presents with  . Hyperlipidemia  . Prediabetes   Subjective    HPI  Lipid/Cholesterol, Follow-up  Last lipid panel Other pertinent labs  Lab Results  Component Value Date   CHOL 209 (H) 03/21/2020   HDL 58 03/21/2020   LDLCALC 130 (H) 03/21/2020   TRIG 116 03/21/2020   CHOLHDL 3.6 03/21/2020   Lab Results  Component Value Date   ALT 39 03/21/2020   AST 36 03/21/2020   PLT 208 03/21/2020   TSH 1.970 06/28/2016     He was last seen for this 3 months ago.  Management since that visit includes starting atorvastatin (LIPITOR) 20 MG tablet; Take one tablet three days a week.  He reports excellent compliance with treatment. He is not having side effects.   Symptoms: No chest pain No chest pressure/discomfort  No dyspnea No lower extremity edema  No numbness or tingling of extremity No orthopnea  No palpitations No paroxysmal nocturnal dyspnea  No speech difficulty No syncope   Current diet: in general, a "healthy" diet   Current exercise: yard work  The 10-year ASCVD risk score Mikey Bussing DC Jr., et al., 2013) is: 24.7%  ---------------------------------------------------------------------------------------------------  Prediabetes, Follow-up  Lab Results  Component Value Date   HGBA1C 6.1 (A) 09/27/2020   HGBA1C 6.1 (H) 03/21/2020   HGBA1C 6.0 (A) 12/07/2019   GLUCOSE 95 03/21/2020   GLUCOSE 107 (H) 08/26/2019   GLUCOSE 85 03/20/2019    Last seen for for this 6 months ago.  Management since that visit includes continue healthy diet and lifestyle modification. Current symptoms include none and have been stable.  Prior visit with dietician: no Current diet: well balanced Current exercise: yard work  Pertinent Labs:    Component Value Date/Time    CHOL 209 (H) 03/21/2020 0948   TRIG 116 03/21/2020 0948   CHOLHDL 3.6 03/21/2020 0948   CREATININE 1.06 03/21/2020 0948    Wt Readings from Last 3 Encounters:  09/27/20 254 lb (115.2 kg)  07/11/20 240 lb (108.9 kg)  06/27/20 251 lb (113.9 kg)   -----------------------------------------------------------------------------------------   Medications: Outpatient Medications Prior to Visit  Medication Sig  . atorvastatin (LIPITOR) 20 MG tablet Take one tablet three days a week   No facility-administered medications prior to visit.    Review of Systems  Constitutional: Negative for appetite change and fatigue.  Eyes: Negative for visual disturbance.  Respiratory: Negative for chest tightness and shortness of breath.   Cardiovascular: Negative for chest pain and palpitations.      Objective    BP 136/82 (BP Location: Right Arm, Patient Position: Sitting, Cuff Size: Large)   Pulse 89   Temp 97.9 F (36.6 C) (Temporal)   Resp 16   Ht 5\' 9"  (1.753 m)   Wt 254 lb (115.2 kg)   SpO2 98%   BMI 37.51 kg/m     Physical Exam   General: Appearance:    Obese male in no acute distress  Eyes:    PERRL, conjunctiva/corneas clear, EOM's intact       Lungs:     Clear to auscultation bilaterally, respirations unlabored  Heart:    Normal heart rate. Normal rhythm. No murmurs, rubs, or gallops.   MS:   All extremities are intact.  Neurologic:   Awake, alert, oriented x 3. No apparent focal neurological           defect.        Results for orders placed or performed in visit on 09/27/20  POCT glycosylated hemoglobin (Hb A1C)  Result Value Ref Range   Hemoglobin A1C 6.1 (A) 4.0 - 5.6 %   Est. average glucose Bld gHb Est-mCnc 131     Assessment & Plan     1. Hyperlipidemia, unspecified hyperlipidemia type Tolerating atorvastatin taking qMWF, but out of meds now for 2 weeks. He is fasting for labs today - Comprehensive metabolic panel - Lipid panel  2. Prediabetes Doing well with  diet.   3. Morbid obesity ([place]) Diet and exercise.   CPE in October.    No follow-ups on file.         Lelon Huh, MD  [facility] [phone] (phone) [phone] (fax)  [place]

## 2020-09-27 NOTE — Patient Instructions (Signed)
High Cholesterol  High cholesterol is a condition in which the blood has high levels of a white, waxy substance similar to fat (cholesterol). The liver makes all the cholesterol that the body needs. The human body needs small amounts of cholesterol to help build cells. A person gets extra or excess cholesterol from the food that he or she eats. The blood carries cholesterol from the liver to the rest of the body. If you have high cholesterol, deposits (plaques) may build up on the walls of your arteries. Arteries are the blood vessels that carry blood away from your heart. These plaques make the arteries narrow and stiff. Cholesterol plaques increase your risk for heart attack and stroke. Work with your health care provider to keep your cholesterol levels in a healthy range. What increases the risk? The following factors may make you more likely to develop this condition:  Eating foods that are high in animal fat (saturated fat) or cholesterol.  Being overweight.  Not getting enough exercise.  A family history of high cholesterol (familial hypercholesterolemia).  Use of tobacco products.  Having diabetes. What are the signs or symptoms? There are no symptoms of this condition. How is this diagnosed? This condition may be diagnosed based on the results of a blood test.  If you are older than 75 years of age, your health care provider may check your cholesterol levels every 4-6 years.  You may be checked more often if you have high cholesterol or other risk factors for heart disease. The blood test for cholesterol measures:  "Bad" cholesterol, or LDL cholesterol. This is the main type of cholesterol that causes heart disease. The desired level is less than 100 mg/dL.  "Good" cholesterol, or HDL cholesterol. HDL helps protect against heart disease by cleaning the arteries and carrying the LDL to the liver for processing. The desired level for HDL is 60 mg/dL or higher.  Triglycerides.  These are fats that your body can store or burn for energy. The desired level is less than 150 mg/dL.  Total cholesterol. This measures the total amount of cholesterol in your blood and includes LDL, HDL, and triglycerides. The desired level is less than 200 mg/dL. How is this treated? This condition may be treated with:  Diet changes. You may be asked to eat foods that have more fiber and less saturated fats or added sugar.  Lifestyle changes. These may include regular exercise, maintaining a healthy weight, and quitting use of tobacco products.  Medicines. These are given when diet and lifestyle changes have not worked. You may be prescribed a statin medicine to help lower your cholesterol levels. Follow these instructions at home: Eating and drinking  Eat a healthy, balanced diet. This diet includes: ? Daily servings of a variety of fresh, frozen, or canned fruits and vegetables. ? Daily servings of whole grain foods that are rich in fiber. ? Foods that are low in saturated fats and trans fats. These include poultry and fish without skin, lean cuts of meat, and low-fat dairy products. ? A variety of fish, especially oily fish that contain omega-3 fatty acids. Aim to eat fish at least 2 times a week.  Avoid foods and drinks that have added sugar.  Use healthy cooking methods, such as roasting, grilling, broiling, baking, poaching, steaming, and stir-frying. Do not fry your food except for stir-frying.   Lifestyle  Get regular exercise. Aim to exercise for a total of 150 minutes a week. Increase your activity level by doing activities   such as gardening, walking, and taking the stairs.  Do not use any products that contain nicotine or tobacco, such as cigarettes, e-cigarettes, and chewing tobacco. If you need help quitting, ask your health care provider.   General instructions  Take over-the-counter and prescription medicines only as told by your health care provider.  Keep all  follow-up visits as told by your health care provider. This is important. Where to find more information  American Heart Association: www.heart.org  National Heart, Lung, and Blood Institute: www.nhlbi.nih.gov Contact a health care provider if:  You have trouble achieving or maintaining a healthy diet or weight.  You are starting an exercise program.  You are unable to stop smoking. Get help right away if:  You have chest pain.  You have trouble breathing.  You have any symptoms of a stroke. "BE FAST" is an easy way to remember the main warning signs of a stroke: ? B - Balance. Signs are dizziness, sudden trouble walking, or loss of balance. ? E - Eyes. Signs are trouble seeing or a sudden change in vision. ? F - Face. Signs are sudden weakness or numbness of the face, or the face or eyelid drooping on one side. ? A - Arms. Signs are weakness or numbness in an arm. This happens suddenly and usually on one side of the body. ? S - Speech. Signs are sudden trouble speaking, slurred speech, or trouble understanding what people say. ? T - Time. Time to call emergency services. Write down what time symptoms started.  You have other signs of a stroke, such as: ? A sudden, severe headache with no known cause. ? Nausea or vomiting. ? Seizure. These symptoms may represent a serious problem that is an emergency. Do not wait to see if the symptoms will go away. Get medical help right away. Call your local emergency services (911 in the [place] Do not drive yourself to the hospital. Summary  Cholesterol plaques increase your risk for heart attack and stroke. Work with your health care provider to keep your cholesterol levels in a healthy range.  Eat a healthy, balanced diet, get regular exercise, and maintain a healthy weight.  Do not use any products that contain nicotine or tobacco, such as cigarettes, e-cigarettes, and chewing tobacco.  Get help right away if you have any symptoms of a  stroke. This information is not intended to replace advice given to you by your health care provider. Make sure you discuss any questions you have with your health care provider. Document Revised: 05/04/2019 Document Reviewed: 05/04/2019 Elsevier Patient Education  2021 [employer]  

## 2020-09-28 ENCOUNTER — Other Ambulatory Visit: Payer: Self-pay | Admitting: Family Medicine

## 2020-09-28 DIAGNOSIS — E785 Hyperlipidemia, unspecified: Secondary | ICD-10-CM

## 2020-09-28 LAB — COMPREHENSIVE METABOLIC PANEL
ALT: 43 IU/L (ref 0–44)
AST: 40 IU/L (ref 0–40)
Albumin/Globulin Ratio: 1.5 (ref 1.2–2.2)
Albumin: 4.7 g/dL (ref 3.7–4.7)
Alkaline Phosphatase: 55 IU/L (ref 44–121)
BUN/Creatinine Ratio: 14 (ref 10–24)
BUN: 16 mg/dL (ref 8–27)
Bilirubin Total: 0.9 mg/dL (ref 0.0–1.2)
CO2: 19 mmol/L — ABNORMAL LOW (ref 20–29)
Calcium: 10.1 mg/dL (ref 8.6–10.2)
Chloride: 105 mmol/L (ref 96–106)
Creatinine, Ser: 1.13 mg/dL (ref 0.76–1.27)
Globulin, Total: 3.1 g/dL (ref 1.5–4.5)
Glucose: 107 mg/dL — ABNORMAL HIGH (ref 65–99)
Potassium: 4.3 mmol/L (ref 3.5–5.2)
Sodium: 142 mmol/L (ref 134–144)
Total Protein: 7.8 g/dL (ref 6.0–8.5)
eGFR: 68 mL/min/{1.73_m2} (ref >59)

## 2020-09-28 LAB — LIPID PANEL
Chol/HDL Ratio: 2.6 ratio (ref 0.0–5.0)
Cholesterol, Total: 176 mg/dL (ref 100–199)
HDL: 68 mg/dL (ref >39)
LDL Chol Calc (NIH): 96 mg/dL (ref 0–99)
Triglycerides: 64 mg/dL (ref 0–149)
VLDL Cholesterol Cal: 12 mg/dL (ref 5–40)

## 2020-09-28 MED ORDER — ATORVASTATIN CALCIUM 20 MG PO TABS: ORAL_TABLET | ORAL | 4 refills | Status: DC

## 2020-11-25 ENCOUNTER — Other Ambulatory Visit: Payer: Self-pay | Admitting: Family Medicine

## 2020-11-25 DIAGNOSIS — E785 Hyperlipidemia, unspecified: Secondary | ICD-10-CM

## 2021-04-03 ENCOUNTER — Encounter: Payer: Self-pay | Admitting: Family Medicine

## 2021-04-03 ENCOUNTER — Ambulatory Visit (INDEPENDENT_AMBULATORY_CARE_PROVIDER_SITE_OTHER): Payer: Medicare HMO | Admitting: Family Medicine

## 2021-04-03 ENCOUNTER — Other Ambulatory Visit: Payer: Self-pay

## 2021-04-03 VITALS — BP 125/77 | HR 62 | Temp 97.8°F | Resp 18 | Ht 69.0 in | Wt 249.0 lb

## 2021-04-03 DIAGNOSIS — L989 Disorder of the skin and subcutaneous tissue, unspecified: Secondary | ICD-10-CM

## 2021-04-03 DIAGNOSIS — E785 Hyperlipidemia, unspecified: Secondary | ICD-10-CM | POA: Diagnosis not present

## 2021-04-03 DIAGNOSIS — Z23 Encounter for immunization: Secondary | ICD-10-CM | POA: Diagnosis not present

## 2021-04-03 DIAGNOSIS — R7303 Prediabetes: Secondary | ICD-10-CM

## 2021-04-03 DIAGNOSIS — Z0000 Encounter for general adult medical examination without abnormal findings: Secondary | ICD-10-CM

## 2021-04-03 DIAGNOSIS — Z125 Encounter for screening for malignant neoplasm of prostate: Secondary | ICD-10-CM | POA: Diagnosis not present

## 2021-04-03 NOTE — Progress Notes (Signed)
Complete physical exam   Patient: Jonathan Greer   DOB: 1945/11/08   75 y.o. Male  MRN: [id number] Visit Date: 04/03/2021  Today's healthcare provider: Lelon Huh, MD   Chief Complaint  Patient presents with   Annual Exam   Hyperlipidemia   Prediabetes   Subjective    Jonathan Greer is a 75 y.o. male who presents today for a complete physical exam.  He reports consuming a general diet. The patient does not participate in regular exercise at present. He generally feels fairly well. He reports sleeping fairly well. He does not have additional problems to discuss today.  Had AWV on 09/01/2020 with [facility]  HPI  Lipid/Cholesterol, Follow-up  Last lipid panel Other pertinent labs  Lab Results  Component Value Date   CHOL 176 09/27/2020   HDL 68 09/27/2020   LDLCALC 96 09/27/2020   TRIG 64 09/27/2020   CHOLHDL 2.6 09/27/2020   Lab Results  Component Value Date   ALT 43 09/27/2020   AST 40 09/27/2020   PLT 208 03/21/2020   TSH 1.970 06/28/2016     He was last seen for this 6 months ago.  Management since that visit includes continue same medication.  He reports good compliance with treatment. He is not having side effects.   Symptoms: No chest pain No chest pressure/discomfort  No dyspnea No lower extremity edema  No numbness or tingling of extremity No orthopnea  No palpitations No paroxysmal nocturnal dyspnea  No speech difficulty No syncope   Current diet: well balanced Current exercise: none  The 10-year ASCVD risk score (Arnett DK, et al., 2019) is: 20.9%  ---------------------------------------------------------------------------------------------------   Prediabetes, Follow-up  Lab Results  Component Value Date   HGBA1C 6.1 (A) 09/27/2020   HGBA1C 6.1 (H) 03/21/2020   HGBA1C 6.0 (A) 12/07/2019   GLUCOSE 107 (H) 09/27/2020   GLUCOSE 95 03/21/2020   GLUCOSE 107 (H) 08/26/2019    Last seen for for this 6 months ago.  Management since  that visit includes continuing lifestyle modifications. Current symptoms include none and have been stable.  Prior visit with dietician: no Current diet: well balanced Current exercise: none  Pertinent Labs:    Component Value Date/Time   CHOL 176 09/27/2020 0923   TRIG 64 09/27/2020 0923   CHOLHDL 2.6 09/27/2020 0923   CREATININE 1.13 09/27/2020 0923    Wt Readings from Last 3 Encounters:  04/03/21 249 lb (112.9 kg)  09/27/20 254 lb (115.2 kg)  07/11/20 240 lb (108.9 kg)    -----------------------------------------------------------------------------------------   Past Medical History:  Diagnosis Date   Basal cell carcinoma    nose   Hyperlipemia 07/07/2015   Hyperlipidemia    Shingles 07/07/2015   Wears dentures    Past Surgical History:  Procedure Laterality Date   CATARACT EXTRACTION W/PHACO Left 03/13/2017   Procedure: CATARACT EXTRACTION PHACO AND INTRAOCULAR LENS PLACEMENT ([place]) LEFT;  Surgeon: Leandrew Koyanagi, MD;  Location: [place];  Service: Ophthalmology;  Laterality: Left;  IVA TOPICAL LEFT   CATARACT EXTRACTION W/PHACO Right 04/17/2017   Procedure: CATARACT EXTRACTION PHACO AND INTRAOCULAR LENS PLACEMENT ([facility]);  Surgeon: Leandrew Koyanagi, MD;  Location: [facility];  Service: Ophthalmology;  Laterality: Right;  IVA TOPICAL RIGHT   COLONOSCOPY WITH PROPOFOL N/A 07/27/2016   Procedure: COLONOSCOPY WITH PROPOFOL;  Surgeon: [name], MD;  Location: [facility] ENDOSCOPY;  Service: Endoscopy;  Laterality: N/A;   COLONOSCOPY WITH PROPOFOL N/A 05/02/2020   Procedure: COLONOSCOPY WITH PROPOFOL;  Surgeon: [name], MD;  Location: [facility]COPY;  Service: Gastroenterology;  Laterality: N/A;   HERNIA REPAIR     MOHS SURGERY  01/10/2016   [facility] for [place]  1960's   Social History   Socioeconomic History   Marital status: Married    Spouse name: Not on file   Number of children: 0   Years of education: Not on  file   Highest education level: 10th grade  Occupational History   Occupation: Retired  Tobacco Use   Smoking status: Former    Packs/day: 2.00    Years: 20.00    Pack years: 40.00    Types: Cigarettes    Quit date: 06/19/1975    Years since quitting: 45.8   Smokeless tobacco: Former    Types: Chew   Tobacco comments:    Quit in the 60s  Vaping Use   Vaping Use: Never used  Substance and Sexual Activity   Alcohol use: No    Alcohol/week: 0.0 standard drinks   Drug use: No   Sexual activity: Not on file  Other Topics Concern   Not on file  Social History Narrative   Working 5 days a week in [employer]. (06/2016)   Social Determinants of Health   Financial Resource Strain: Low Risk    Difficulty of Paying Living Expenses: Not hard at all  Food Insecurity: No Food Insecurity   Worried About Charity fundraiser in the Last Year: Never true   [place] in the Last Year: Never true  Transportation Needs: No Transportation Needs   Lack of Transportation (Medical): No   Lack of Transportation (Non-Medical): No  Physical Activity: Inactive   Days of Exercise per Week: 0 days   Minutes of Exercise per Session: 0 min  Stress: No Stress Concern Present   Feeling of Stress : Not at all  Social Connections: Moderately Isolated   Frequency of Communication with Friends and Family: More than three times a week   Frequency of Social Gatherings with Friends and Family: More than three times a week   Attends Religious Services: Never   Marine scientist or Organizations: No   Attends Archivist Meetings: Never   Marital Status: Married  Human resources officer Violence: Not At Risk   Fear of Current or Ex-Partner: No   Emotionally Abused: No   Physically Abused: No   Sexually Abused: No   Family Status  Relation Name Status   Mother  Deceased at age 6   Father  Deceased at age 68   Brother  Deceased   MGM  Deceased   MGF  Deceased   PGM  Deceased   PGF   Deceased   Neg Hx  (Not Specified)   Family History  Problem Relation Age of Onset   Heart failure Mother    CVA Father    Lung cancer Brother    Liver cancer Maternal Grandmother    Cancer Paternal Grandmother    Cancer Paternal Grandfather    Bladder Cancer Neg Hx    Prostate cancer Neg Hx    Kidney cancer Neg Hx    Allergies  Allergen Reactions   Pravastatin     Hip pain    Patient Care Team: Birdie Sons, MD as PCP - General (Family Medicine) [place], [place] as Consulting Physician (Optometry) [name], MD as Consulting Physician (Gastroenterology) Tamsen Meek, MD as Referring Physician (Dermatology)  Medications: Outpatient Medications Prior to Visit  Medication Sig   atorvastatin (LIPITOR) 20 MG tablet TAKE ONE TABLET THREE DAYS A WEEK   No facility-administered medications prior to visit.    Review of Systems  Constitutional:  Negative for appetite change, chills, fatigue and fever.  HENT:  Negative for congestion, ear pain, hearing loss, nosebleeds and trouble swallowing.   Eyes:  Negative for pain and visual disturbance.  Respiratory:  Negative for cough, chest tightness, shortness of breath and wheezing.   Cardiovascular:  Negative for chest pain, palpitations and leg swelling.  Gastrointestinal:  Negative for abdominal pain, blood in stool, constipation, diarrhea, nausea and vomiting.  Endocrine: Negative for polydipsia, polyphagia and polyuria.  Genitourinary:  Negative for dysuria and flank pain.  Musculoskeletal:  Negative for arthralgias, back pain, joint swelling, myalgias and neck stiffness.  Skin:  Negative for color change, rash and wound.  Neurological:  Negative for dizziness, tremors, seizures, speech difficulty, weakness, light-headedness and headaches.  Psychiatric/Behavioral:  Negative for behavioral problems, confusion, decreased concentration, dysphoric mood and sleep disturbance. The patient is not nervous/anxious.    All other systems reviewed and are negative.    Objective    BP 125/77 (BP Location: Left Arm, Patient Position: Sitting, Cuff Size: Large)   Pulse 62   Temp 97.8 F (36.6 C) (Oral)   Resp 18   Ht 5\' 9"  (1.753 m)   Wt 249 lb (112.9 kg)   SpO2 98% Comment: room air  BMI 36.77 kg/m    Physical Exam   General Appearance:    Mildly obese male. Alert, cooperative, in no acute distress, appears stated age  Head:    Normocephalic, without obvious abnormality, atraumatic  Eyes:    PERRL, conjunctiva/corneas clear, EOM's intact, fundi    benign, both eyes       Ears:    Normal TM's and external ear canals, both ears  Neck:   Supple, symmetrical, trachea midline, no adenopathy;       thyroid:  No enlargement/tenderness/nodules; no carotid   bruit or JVD  Back:     Symmetric, no curvature, ROM normal, no CVA tenderness  Lungs:     Clear to auscultation bilaterally, respirations unlabored  Chest wall:    No tenderness or deformity  Heart:    Normal heart rate. Normal rhythm. No murmurs, rubs, or gallops.  S1 and S2 normal  Abdomen:     Soft, non-tender, bowel sounds active all four quadrants,    no masses, no organomegaly  Genitalia:    deferred  Rectal:    deferred  Extremities:   All extremities are intact. No cyanosis or edema  Pulses:   2+ and symmetric all extremities  Skin:   Skin color, texture, turgor normal. Scabbed lesion on right temple which he states has been there over a year and does not heal.   Lymph nodes:   Cervical, supraclavicular, and axillary nodes normal  Neurologic:   CNII-XII intact. Normal strength, sensation and reflexes      throughout     Last depression screening scores PHQ 2/9 Scores 04/03/2021 09/01/2020 03/21/2020  PHQ - 2 Score 0 0 0  PHQ- 9 Score 0 - 0   Last fall risk screening Fall Risk  04/03/2021  Falls in the past year? 0  Comment -  Number falls in past yr: 0  Comment -  Injury with Fall? 0  Comment -  Risk for fall due to : No Fall  Risks  Follow up  Falls prevention discussed   Last Audit-C alcohol use screening Alcohol Use Disorder Test (AUDIT) 04/03/2021  1. How often do you have a drink containing alcohol? 0  2. How many drinks containing alcohol do you have on a typical day when you are drinking? 0  3. How often do you have six or more drinks on one occasion? 0  AUDIT-C Score 0  Alcohol Brief Interventions/Follow-up -   A score of 3 or more in women, and 4 or more in men indicates increased risk for alcohol abuse, EXCEPT if all of the points are from question 1   No results found for any visits on 04/03/21.  Assessment & Plan    Routine Health Maintenance and Physical Exam  Exercise Activities and Dietary recommendations  Goals      Diet      Recommend to continue with current diet plan of cutting out all foods that are white (focusing on carbs and sugars).      DIET - INCREASE WATER INTAKE     Recommend to drink at least 6-8 8oz glasses of water per day.     Prevent falls     Recommend to remove any items from the home that may cause slips or trips.        Immunization History  Administered Date(s) Administered   Fluad Quad(high Dose 65+) 03/20/2019, 03/21/2020   Influenza, High Dose Seasonal PF 06/27/2016, 04/26/2017, 05/02/2018   Pneumococcal Conjugate-13 10/07/2014   Pneumococcal Polysaccharide-23 03/18/2013   Tdap 06/30/2010, 01/15/2017    Health Maintenance  Topic Date Due   COVID-19 Vaccine (1) Never done   Zoster Vaccines- Shingrix (1 of 2) Never done   INFLUENZA VACCINE  01/16/2021   COLONOSCOPY (Pts 45-30yrs Insurance coverage will need to be confirmed)  05/02/2021   TETANUS/TDAP  01/16/2027   Hepatitis C Screening  Completed   HPV VACCINES  Aged Out    Discussed health benefits of physical activity, and encouraged him to engage in regular exercise appropriate for his age and condition.  1. Skin lesion of scalp Suspicious for basal cell.  - Ambulatory referral to  Dermatology  2. Prediabetes  - Hemoglobin A1c  3. Hyperlipidemia, unspecified hyperlipidemia type He is tolerating atorvastatin well with no adverse effects.   - CBC - Comprehensive metabolic panel - Lipid panel  4. Prostate cancer screening  - PSA  5. Need for influenza vaccination  - Flu Vaccine QUAD High Dose IM Nash Shearer)  Future Appointments  Date Time Provider [place]  09/07/2021  2:00 PM [facility] HEALTH ADVISOR [facility]  10/02/2021  8:00 AM Caryn Section Kirstie Peri, MD [facility]       The entirety of the information documented in the History of Present Illness, Review of Systems and Physical Exam were personally obtained by me. Portions of this information were initially documented by the CMA and reviewed by me for thoroughness and accuracy.     Lelon Huh, MD  [facility] [phone] (phone) [phone] (fax)  [place]

## 2021-04-04 LAB — CBC
Hematocrit: 48.6 % (ref 37.5–51.0)
Hemoglobin: 16.2 g/dL (ref 13.0–17.7)
MCH: 30.6 pg (ref 26.6–33.0)
MCHC: 33.3 g/dL (ref 31.5–35.7)
MCV: 92 fL (ref 79–97)
Platelets: 197 10*3/uL (ref 150–450)
RBC: 5.29 x10E6/uL (ref 4.14–5.80)
RDW: 12.9 % (ref 11.6–15.4)
WBC: 6.5 10*3/uL (ref 3.4–10.8)

## 2021-04-04 LAB — LIPID PANEL
Chol/HDL Ratio: 2.9 ratio (ref 0.0–5.0)
Cholesterol, Total: 170 mg/dL (ref 100–199)
HDL: 59 mg/dL (ref >39)
LDL Chol Calc (NIH): 90 mg/dL (ref 0–99)
Triglycerides: 121 mg/dL (ref 0–149)
VLDL Cholesterol Cal: 21 mg/dL (ref 5–40)

## 2021-04-04 LAB — HEMOGLOBIN A1C
Est. average glucose Bld gHb Est-mCnc: 134 mg/dL
Hgb A1c MFr Bld: 6.3 % — ABNORMAL HIGH (ref 4.8–5.6)

## 2021-04-04 LAB — COMPREHENSIVE METABOLIC PANEL
ALT: 33 IU/L (ref 0–44)
AST: 30 IU/L (ref 0–40)
Albumin/Globulin Ratio: 1.7 (ref 1.2–2.2)
Albumin: 4.5 g/dL (ref 3.7–4.7)
Alkaline Phosphatase: 46 IU/L (ref 44–121)
BUN/Creatinine Ratio: 14 (ref 10–24)
BUN: 15 mg/dL (ref 8–27)
Bilirubin Total: 0.9 mg/dL (ref 0.0–1.2)
CO2: 24 mmol/L (ref 20–29)
Calcium: 9.9 mg/dL (ref 8.6–10.2)
Chloride: 104 mmol/L (ref 96–106)
Creatinine, Ser: 1.06 mg/dL (ref 0.76–1.27)
Globulin, Total: 2.6 g/dL (ref 1.5–4.5)
Glucose: 114 mg/dL — ABNORMAL HIGH (ref 70–99)
Potassium: 4.2 mmol/L (ref 3.5–5.2)
Sodium: 139 mmol/L (ref 134–144)
Total Protein: 7.1 g/dL (ref 6.0–8.5)
eGFR: 73 mL/min/{1.73_m2} (ref >59)

## 2021-04-04 LAB — PSA: Prostate Specific Ag, Serum: 0.8 ng/mL (ref 0.0–4.0)

## 2021-06-28 ENCOUNTER — Other Ambulatory Visit: Payer: Self-pay | Admitting: Family Medicine

## 2021-06-28 DIAGNOSIS — E785 Hyperlipidemia, unspecified: Secondary | ICD-10-CM

## 2021-06-28 NOTE — Telephone Encounter (Signed)
Requested Prescriptions  Pending Prescriptions Disp Refills   atorvastatin (LIPITOR) 20 MG tablet [Pharmacy Med Name: ATORVASTATIN 20 MG TABLET] 24 tablet 3    Sig: TAKE ONE TABLET THREE DAYS A WEEK     Cardiovascular:  Antilipid - Statins Passed - 06/28/2021  1:22 AM      Passed - Total Cholesterol in normal range and within 360 days    Cholesterol, Total  Date Value Ref Range Status  04/03/2021 170 100 - 199 mg/dL Final         Passed - LDL in normal range and within 360 days    LDL Chol Calc (NIH)  Date Value Ref Range Status  04/03/2021 90 0 - 99 mg/dL Final         Passed - HDL in normal range and within 360 days    HDL  Date Value Ref Range Status  04/03/2021 59 >39 mg/dL Final         Passed - Triglycerides in normal range and within 360 days    Triglycerides  Date Value Ref Range Status  04/03/2021 121 0 - 149 mg/dL Final         Passed - Patient is not pregnant      Passed - Valid encounter within last 12 months    Recent Outpatient Visits          2 months ago Annual physical exam   [facility] Birdie Sons, MD   9 months ago Hyperlipidemia, unspecified hyperlipidemia type   [facility] Birdie Sons, MD   1 year ago Hyperlipidemia, unspecified hyperlipidemia type   [facility] Birdie Sons, MD   1 year ago Prediabetes   [facility] Birdie Sons, MD   1 year ago Prediabetes   [facility] Birdie Sons, MD      Future Appointments            In 3 months Fisher, Kirstie Peri, MD [facility] [place]   In 3 months Ralene Bathe, MD [place]

## 2021-07-18 IMAGING — CT CT HEAD W/O CM
3 series · 15 of 47 positions shown, 18 images · non-contrast
Comparison: 05/10/2020

CLINICAL DATA: Frequent, nightly headaches.  Fell 2 months ago.

EXAM:
CT HEAD WITHOUT CONTRAST
TECHNIQUE: Contiguous axial images were obtained from the base of the skull
through the vertex without intravenous contrast.

[Series 2: head wo · axial · 0.45mm/px · z∈[-135,+0]mm · 9 of 33 slices shown, 12 images]
[im 3/33  brain]
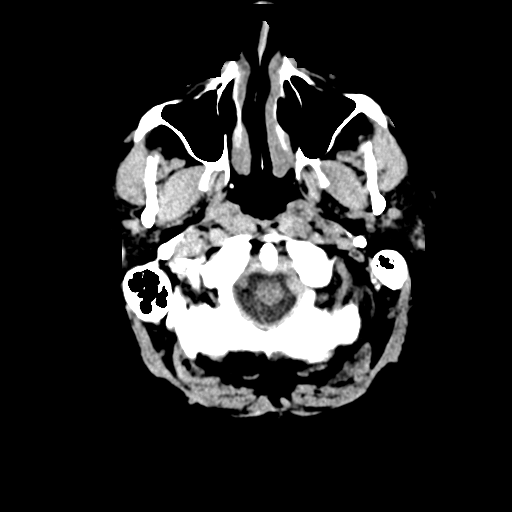
[im 3/33  bone]
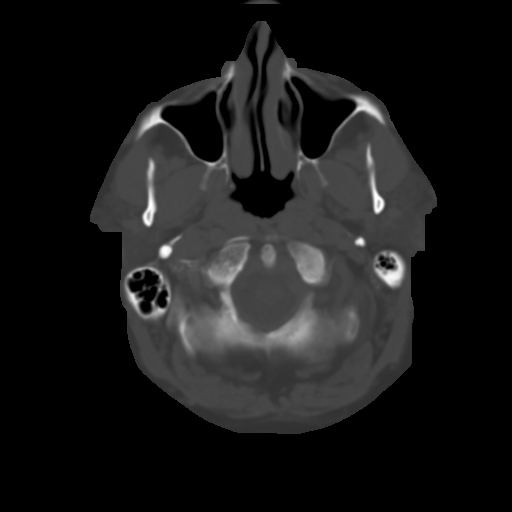
[im 6/33  brain]
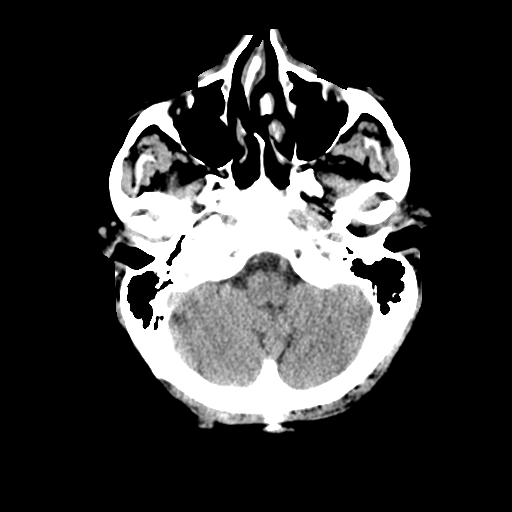
[im 9/33  brain]
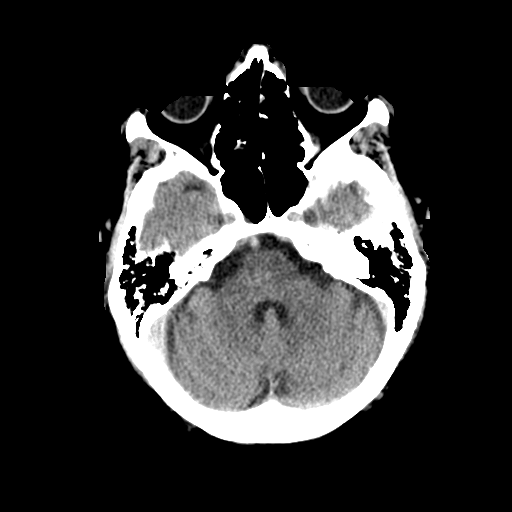
[im 13/33  brain]
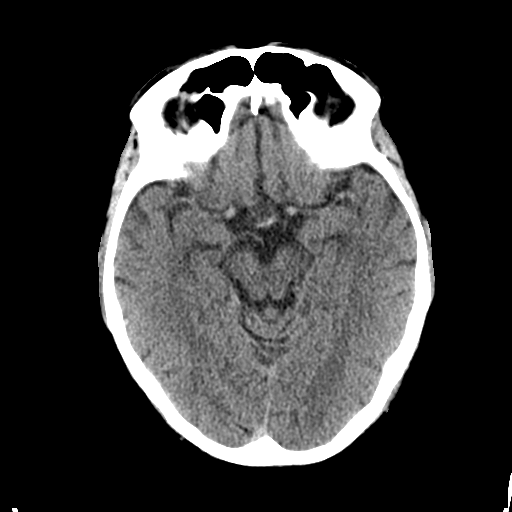
[im 17/33  brain]
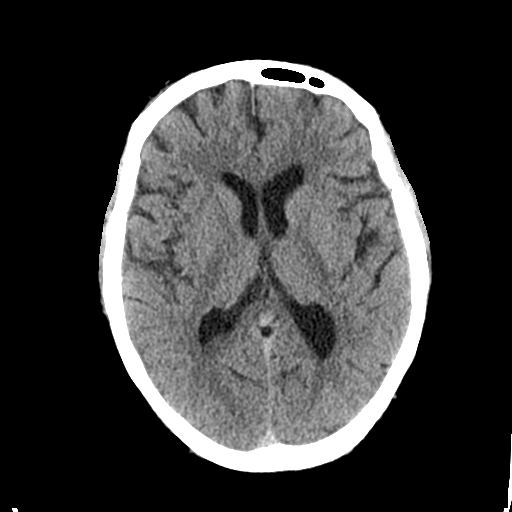
[im 17/33  bone]
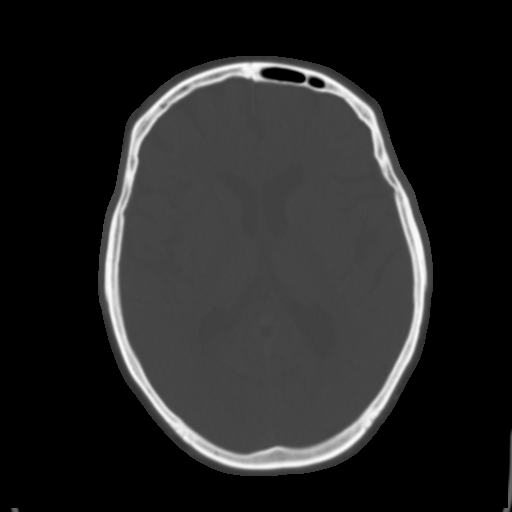
[im 20/33  brain]
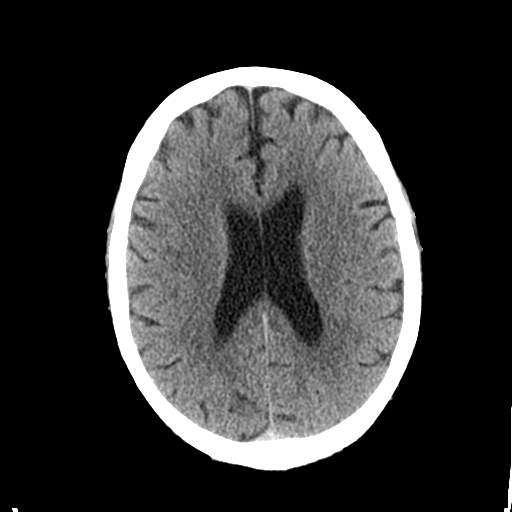
[im 24/33  brain]
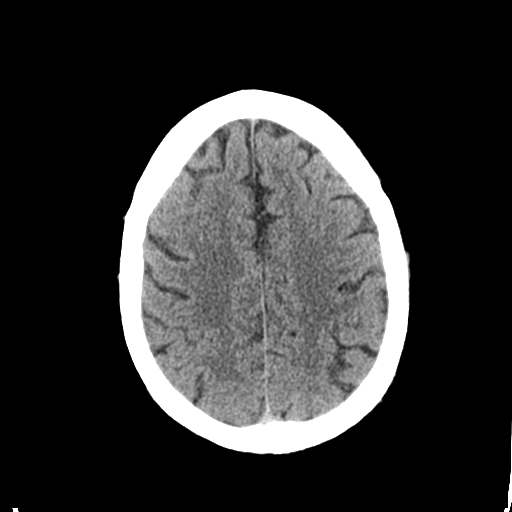
[im 27/33  brain]
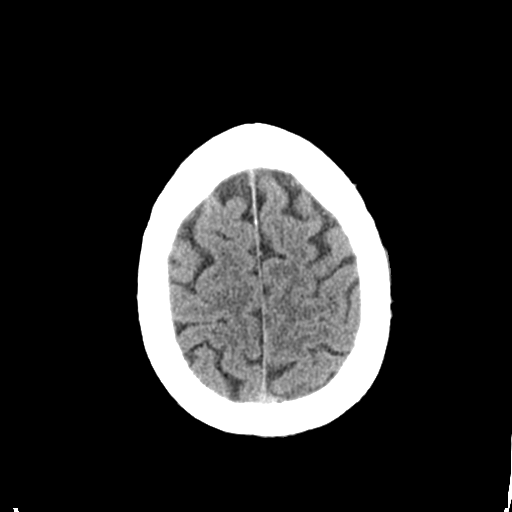
[im 30/33  brain]
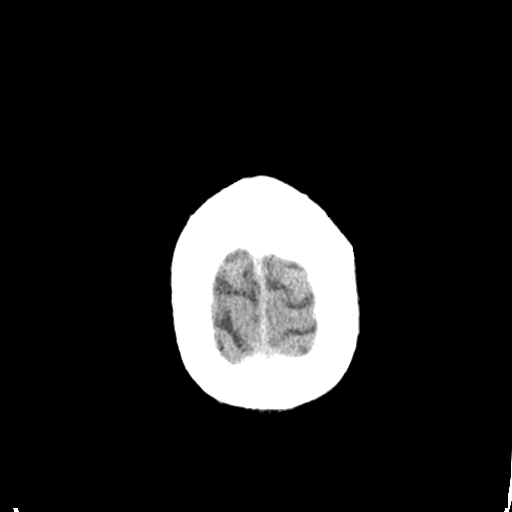
[im 30/33  bone]
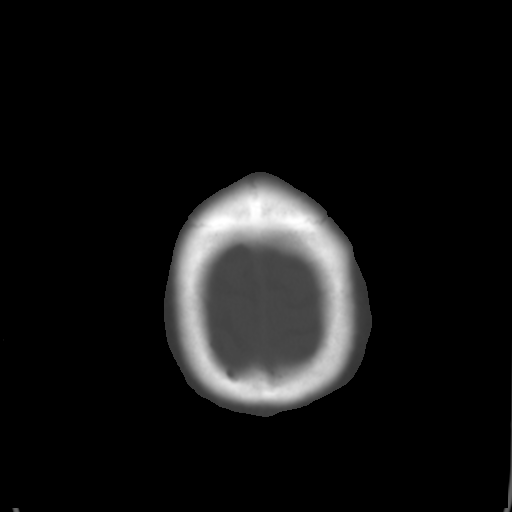

[Series 4: coronal soft tissue · coronal · 0.31mm/px · 3 of 70 slices shown]
[im 24/70  brain]
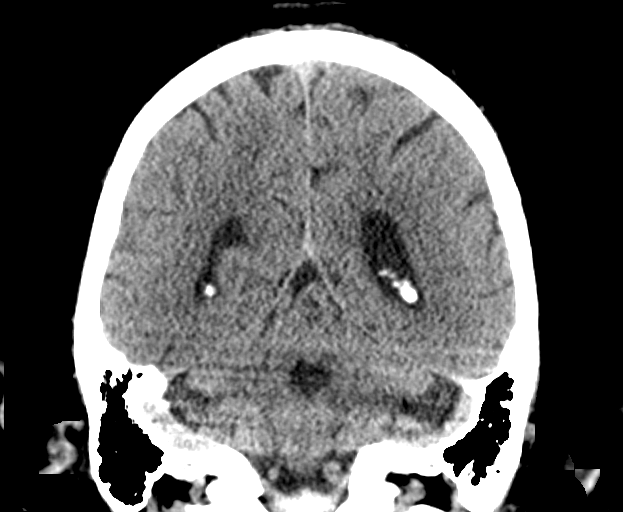
[im 31/70  brain]
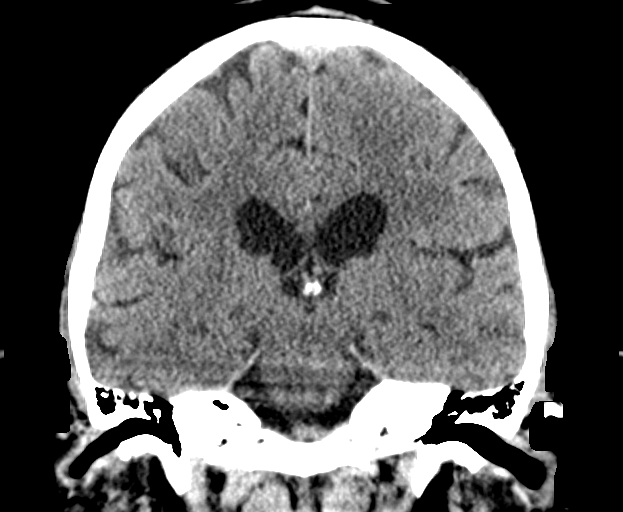
[im 39/70  brain]
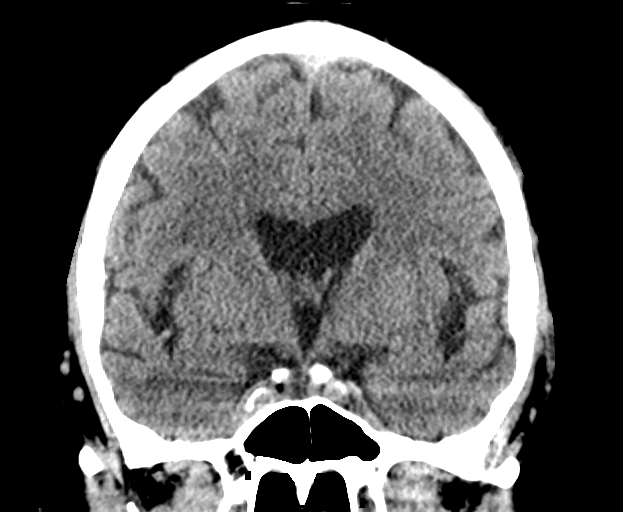

[Series 5: sagittal soft tissue · sagittal · 0.31mm/px · 3 of 64 slices shown]
[im 22/64  brain]
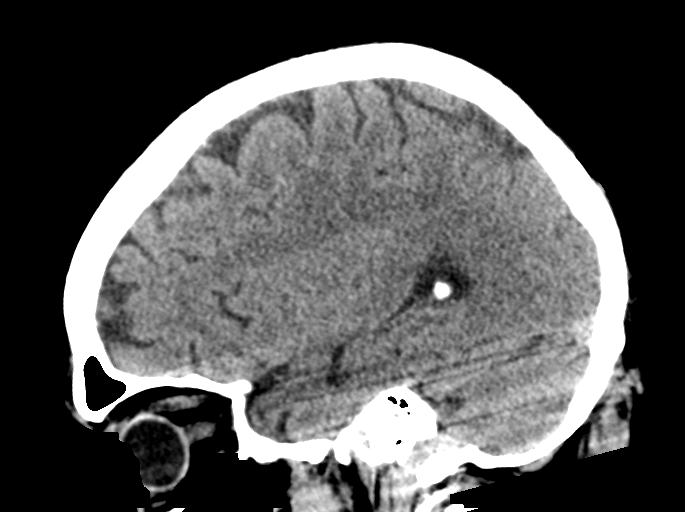
[im 32/64  brain]
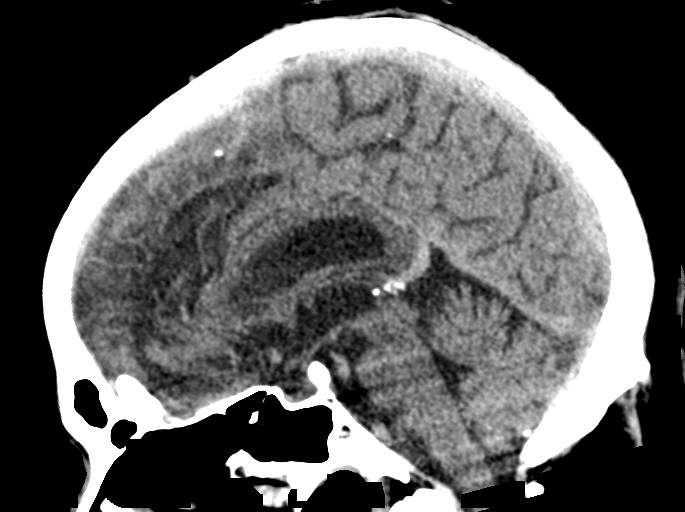
[im 43/64  brain]
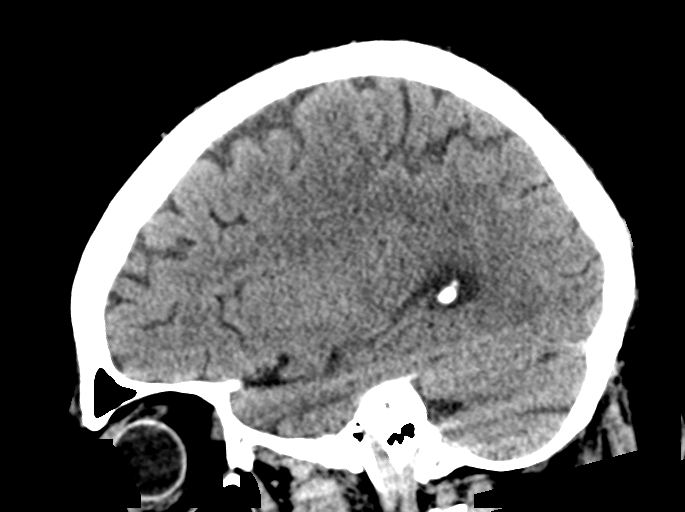

[15 of 47 positions shown; findings below may reference images not displayed]

FINDINGS: Brain: Mildly enlarged ventricles and cortical sulci. Minimal patchy
white matter low density in both cerebral hemispheres. No
intracranial hemorrhage, mass lesion or CT evidence of acute
infarction.

Vascular: No hyperdense vessel or unexpected calcification.

Skull: Normal. Negative for fracture or focal lesion.

Sinuses/Orbits: Unremarkable.

Other: Deviation of the midportion of the nasal septum to the right.
IMPRESSION: No acute abnormality.

## 2021-10-02 ENCOUNTER — Ambulatory Visit: Payer: Medicare HMO | Admitting: Family Medicine

## 2021-10-05 ENCOUNTER — Ambulatory Visit: Payer: Medicare HMO | Admitting: Dermatology

## 2021-10-18 NOTE — Progress Notes (Signed)
?  ? ?I,Roshena L Chambers,acting as a scribe for Lelon Huh, MD.,have documented all relevant documentation on the behalf of Mickel Schreur, MD,as directed by  Lelon Huh, MD while in the presence of Lelon Huh, MD.  ? ?Established patient visit ? ? ?Patient: Jonathan Greer   DOB: 1946/04/26   75 y.o. Male  MRN: [id number] ?Visit Date: 10/20/2021 ? ?Today's healthcare provider: Lelon Huh, MD  ? ?Chief Complaint  ?Patient presents with  ? Prediabetes  ? Hyperlipidemia  ? ?Subjective  ?  ?HPI  ?Prediabetes, Follow-up ? ?Lab Results  ?Component Value Date  ? HGBA1C 6.3 (H) 04/03/2021  ? HGBA1C 6.1 (A) 09/27/2020  ? HGBA1C 6.1 (H) 03/21/2020  ? GLUCOSE 114 (H) 04/03/2021  ? GLUCOSE 107 (H) 09/27/2020  ? GLUCOSE 95 03/21/2020  ? ? ?Last seen for for this 6 months ago.  ?Management since that visit includes advising patient to avoid sweets and starchy foods as much as  ? ?Prior visit with dietician: no ?Current diet: well balanced ?Current exercise: yard work ? ?Pertinent Labs: ?   ?Component Value Date/Time  ? CHOL 170 04/03/2021 0838  ? TRIG 121 04/03/2021 0838  ? CHOLHDL 2.9 04/03/2021 0838  ? CREATININE 1.06 04/03/2021 0838  ? ? ?Wt Readings from Last 3 Encounters:  ?10/20/21 249 lb (112.9 kg)  ?04/03/21 249 lb (112.9 kg)  ?09/27/20 254 lb (115.2 kg)  ? ? ?-----------------------------------------------------------------------------------------  ? ?Lipid/Cholesterol, Follow-up ? ?Last lipid panel Other pertinent labs  ?Lab Results  ?Component Value Date  ? CHOL 170 04/03/2021  ? HDL 59 04/03/2021  ? [place] 90 04/03/2021  ? TRIG 121 04/03/2021  ? CHOLHDL 2.9 04/03/2021  ? Lab Results  ?Component Value Date  ? ALT 33 04/03/2021  ? AST 30 04/03/2021  ? PLT 197 04/03/2021  ? TSH 1.970 06/28/2016  ?  ? ?He was last seen for this 6 months ago.  ?Management since that visit includes continue same medication. ? ?He reports poor compliance with treatment. Patient stopped taking medication 4 months ago due to  it causing joint pain. ?He is having side effects. Joint pain in hips and ankles ? ?Symptoms: ?No chest pain No chest pressure/discomfort  ?No dyspnea No lower extremity edema  ?No numbness or tingling of extremity No orthopnea  ?No palpitations No paroxysmal nocturnal dyspnea  ?No speech difficulty No syncope  ? ?Current diet: well balanced ?Current exercise: yard work ? ?The 10-year ASCVD risk score (Arnett DK, et al., 2019) is: 27.1% ? ?---------------------------------------------------------------------------------------------------  ? ?Medications: ?Outpatient Medications Prior to Visit  ?Medication Sig  ? atorvastatin (LIPITOR) 20 MG tablet TAKE ONE TABLET THREE DAYS A WEEK (Patient not taking: Reported on 10/20/2021)  ? ?No facility-administered medications prior to visit.  ? ? ?Review of Systems  ?Constitutional:  Negative for appetite change, chills and fever.  ?Respiratory:  Negative for chest tightness, shortness of breath and wheezing.   ?Cardiovascular:  Negative for chest pain and palpitations.  ?Gastrointestinal:  Negative for abdominal pain, nausea and vomiting.  ? ? ?  Objective  ?  ?BP (!) 145/83 (BP Location: Left Arm, Patient Position: Sitting, Cuff Size: Large)   Pulse (!) 52   Temp 97.6 ?F (36.4 ?C) (Oral)   Resp 14   Wt 249 lb (112.9 kg)   SpO2 99% Comment: room air  BMI 36.77 kg/m?  ? ? ?Physical Exam  ? ?General appearance: Mildly obese male, cooperative and in no acute distress ?Head: Normocephalic, without obvious abnormality, atraumatic ?Respiratory:  Respirations even and unlabored, normal respiratory rate ?Extremities: All extremities are intact.  ?Skin: Skin color, texture, turgor normal. No rashes seen  ?Psych: Appropriate mood and affect. ?Neurologic: Mental status: Alert, oriented to person, place, and time, thought content appropriate.  ? ? Assessment & Plan  ?  ? ?1. Prediabetes ?Diet controlled, stable.  ? ?2. Hyperlipidemia, unspecified hyperlipidemia type ?Intolerant to  pravastatin and atorvastatin. Try ezetimibe (ZETIA) 10 MG tablet; Take 1 tablet (10 mg total) by mouth daily.  Dispense: 90 tablet; Refill: 2   ? ?Future Appointments  ?Date Time Provider [place]  ?04/04/2022  9:40 AM Caryn Section, Kirstie Peri, MD [facility]  ?  ?   ? ?The entirety of the information documented in the History of Present Illness, Review of Systems and Physical Exam were personally obtained by me. Portions of this information were initially documented by the CMA and reviewed by me for thoroughness and accuracy.   ? ? ?Lelon Huh, MD  ?[facility] ?[phone] (phone) ?[phone] (fax) ? ?[facility]  ?

## 2021-10-20 ENCOUNTER — Encounter: Payer: Self-pay | Admitting: Family Medicine

## 2021-10-20 ENCOUNTER — Ambulatory Visit (INDEPENDENT_AMBULATORY_CARE_PROVIDER_SITE_OTHER): Payer: Medicare HMO | Admitting: Family Medicine

## 2021-10-20 VITALS — BP 145/83 | HR 52 | Temp 97.6°F | Resp 14 | Wt 249.0 lb

## 2021-10-20 DIAGNOSIS — E785 Hyperlipidemia, unspecified: Secondary | ICD-10-CM | POA: Diagnosis not present

## 2021-10-20 DIAGNOSIS — R7303 Prediabetes: Secondary | ICD-10-CM | POA: Diagnosis not present

## 2021-10-20 LAB — POCT GLYCOSYLATED HEMOGLOBIN (HGB A1C)
Est. average glucose Bld gHb Est-mCnc: 131
Hemoglobin A1C: 6.2 % — AB (ref 4.0–5.6)

## 2021-10-20 MED ORDER — EZETIMIBE 10 MG PO TABS: 10.0000 mg | ORAL_TABLET | Freq: Every day | ORAL | 2 refills | Status: DC

## 2021-10-20 NOTE — Patient Instructions (Signed)
.   Please review the attached list of medications and notify my office if there are any errors.   . Please bring all of your medications to every appointment so we can make sure that our medication list is the same as yours.   

## 2021-12-01 ENCOUNTER — Other Ambulatory Visit: Payer: Self-pay | Admitting: Family Medicine

## 2021-12-01 DIAGNOSIS — E785 Hyperlipidemia, unspecified: Secondary | ICD-10-CM

## 2022-01-05 ENCOUNTER — Telehealth: Payer: Self-pay | Admitting: Family Medicine

## 2022-01-05 NOTE — Telephone Encounter (Signed)
Copied from [place] [id number]. Topic: Medicare AWV >> Jan 05, 2022  2:03 PM Jae Dire wrote: Reason for CRM:  Left message for patient to call back and schedule Medicare Annual Wellness Visit (AWV) in office.   If unable to come into the office for AWV,  please offer to do virtually or by telephone.  Last AWV:  09/01/2020  Please schedule at anytime with [facility] Health Advisor.  30 minute appointment for Virtual or phone 45 minute appointment for in office or Initial virtual/phone  Any questions, please contact me at [phone]

## 2022-01-10 DIAGNOSIS — H524 Presbyopia: Secondary | ICD-10-CM | POA: Diagnosis not present

## 2022-02-22 ENCOUNTER — Telehealth: Payer: Self-pay | Admitting: Family Medicine

## 2022-02-22 NOTE — Telephone Encounter (Signed)
Copied from [place] [id number]. Topic: Medicare AWV >> Feb 22, 2022  1:34 PM Jae Dire wrote: Reason for CRM:  Left message for patient to call back and schedule Medicare Annual Wellness Visit (AWV) in office.   If unable to come into the office for AWV,  please offer to do virtually or by telephone.  Last AWV: 09/01/2020  Please schedule at anytime with [facility] Health Advisor.  30 minute appointment for Virtual or phone 45 minute appointment for in office or Initial virtual/phone  Any questions, please contact me at [phone]

## 2022-03-02 ENCOUNTER — Ambulatory Visit
Admission: RE | Admit: 2022-03-02 | Discharge: 2022-03-02 | Disposition: A | Discharge status: Home / Self Care | Payer: Medicare HMO | Attending: Family Medicine | Admitting: Family Medicine

## 2022-03-02 ENCOUNTER — Ambulatory Visit
Admission: RE | Admit: 2022-03-02 | Discharge: 2022-03-02 | Disposition: A | Discharge status: Home / Self Care | Payer: Medicare HMO | Source: Ambulatory Visit | Attending: Family Medicine | Admitting: Family Medicine

## 2022-03-02 ENCOUNTER — Ambulatory Visit (INDEPENDENT_AMBULATORY_CARE_PROVIDER_SITE_OTHER): Payer: Medicare HMO | Admitting: Family Medicine

## 2022-03-02 ENCOUNTER — Encounter: Payer: Self-pay | Admitting: Family Medicine

## 2022-03-02 VITALS — BP 137/89 | HR 56 | Temp 98.1°F | Resp 16 | Wt 245.0 lb

## 2022-03-02 DIAGNOSIS — M5416 Radiculopathy, lumbar region: Secondary | ICD-10-CM | POA: Diagnosis not present

## 2022-03-02 DIAGNOSIS — M2578 Osteophyte, vertebrae: Secondary | ICD-10-CM | POA: Diagnosis not present

## 2022-03-02 DIAGNOSIS — M4726 Other spondylosis with radiculopathy, lumbar region: Secondary | ICD-10-CM | POA: Diagnosis not present

## 2022-03-02 DIAGNOSIS — Z23 Encounter for immunization: Secondary | ICD-10-CM | POA: Diagnosis not present

## 2022-03-02 DIAGNOSIS — M5116 Intervertebral disc disorders with radiculopathy, lumbar region: Secondary | ICD-10-CM | POA: Diagnosis not present

## 2022-03-02 NOTE — Progress Notes (Unsigned)
   I,Sulibeya S Dimas,acting as a scribe for Lelon Huh, MD.,have documented all relevant documentation on the behalf of Lelon Huh, MD,as directed by  Lelon Huh, MD while in the presence of Lelon Huh, MD.     Established patient visit   Patient: Jonathan Greer   DOB: 1946-01-02   76 y.o. Male  MRN: [id number] Visit Date: 03/02/2022  Today's healthcare provider: Lelon Huh, MD   Chief Complaint  Patient presents with   Hip Pain   Subjective    There was no injury mechanism. The pain is present on the side of his left hip. The quality of the pain is described as aching. The pain is mild. The pain has been Fluctuating since onset. Associated symptoms include numbness. Pertinent negatives include no loss of motion, loss of sensation or muscle weakness. The symptoms are aggravated by weight bearing for extended periods of time.Marland Kitchen He has tried ibuprofen for the symptoms. The treatment provided moderate relief. Also his numb sensation going to the side of his left upper leg.   Medications: Outpatient Medications Prior to Visit  Medication Sig   ezetimibe (ZETIA) 10 MG tablet Take 1 tablet (10 mg total) by mouth daily.   No facility-administered medications prior to visit.    Review of Systems  Constitutional:  Negative for appetite change, fatigue and fever.  Respiratory:  Negative for chest tightness and shortness of breath.   Cardiovascular:  Negative for chest pain and palpitations.  Musculoskeletal:  Positive for myalgias.  Neurological:  Positive for numbness.     Objective    BP 137/89 (BP Location: Right Arm, Patient Position: Sitting, Cuff Size: Large)   Pulse (!) 56   Temp 98.1 F (36.7 C) (Oral)   Resp 16   Wt 245 lb (111.1 kg)   SpO2 100%   BMI 36.18 kg/m  BP Readings from Last 3 Encounters:  03/02/22 137/89  10/20/21 (!) 145/83  04/03/21 125/77   Wt Readings from Last 3 Encounters:  03/02/22 245 lb (111.1 kg)  10/20/21 249 lb (112.9 kg)   04/03/21 249 lb (112.9 kg)    Physical Exam  General appearance: Mildly obese male, cooperative and in no acute distress Head: Normocephalic, without obvious abnormality, atraumatic Respiratory: Respirations even and unlabored, normal respiratory rate Extremities: All extremities are intact. FROM of hip and knee with know pain. No tenderness of leg or trochanter. No lumbar spine or para-spinous tenderness.  Skin: Skin color, texture, turgor normal. No rashes seen  Psych: Appropriate mood and affect. Neurologic: Mental status: Alert, oriented to person, place, and time, thought content appropriate.     Assessment & Plan    1. Lumbar radiculopathy  - DG Lumbar Spine Complete; Future  2. Need for influenza vaccination  - Flu Vaccine QUAD High Dose(Fluad)      The entirety of the information documented in the History of Present Illness, Review of Systems and Physical Exam were personally obtained by me. Portions of this information were initially documented by the CMA and reviewed by me for thoroughness and accuracy.     Lelon Huh, MD  [facility] [phone] (phone) [phone] (fax)  [place]

## 2022-03-06 ENCOUNTER — Telehealth: Payer: Self-pay

## 2022-03-06 NOTE — Telephone Encounter (Signed)
Opened in error

## 2022-03-11 ENCOUNTER — Other Ambulatory Visit: Payer: Self-pay | Admitting: Family Medicine

## 2022-03-11 DIAGNOSIS — E785 Hyperlipidemia, unspecified: Secondary | ICD-10-CM

## 2022-03-12 ENCOUNTER — Other Ambulatory Visit: Payer: Self-pay | Admitting: Family Medicine

## 2022-03-12 ENCOUNTER — Ambulatory Visit (INDEPENDENT_AMBULATORY_CARE_PROVIDER_SITE_OTHER): Payer: Medicare HMO

## 2022-03-12 VITALS — Ht 69.0 in | Wt 245.0 lb

## 2022-03-12 DIAGNOSIS — Z0000 Encounter for general adult medical examination without abnormal findings: Secondary | ICD-10-CM | POA: Diagnosis not present

## 2022-03-12 DIAGNOSIS — Z1211 Encounter for screening for malignant neoplasm of colon: Secondary | ICD-10-CM

## 2022-03-12 MED ORDER — MELOXICAM 15 MG PO TABS: 15.0000 mg | ORAL_TABLET | Freq: Every day | ORAL | 0 refills | Status: DC

## 2022-03-12 NOTE — Progress Notes (Signed)
Virtual Visit via Telephone Note  I connected with  [name] on 03/12/22 at  8:15 AM EDT by telephone and verified that I am speaking with the correct person using two identifiers.  Location: Patient: home Provider: [facility] Persons participating in the virtual visit: [place]   I discussed the limitations, risks, security and privacy concerns of performing an evaluation and management service by telephone and the availability of in person appointments. The patient expressed understanding and agreed to proceed.  Interactive audio and video telecommunications were attempted between this nurse and patient, however failed, due to patient having technical difficulties OR patient did not have access to video capability.  We continued and completed visit with audio only.  Some vital signs may be absent or patient reported.   Jonathan David, LPN  Subjective:   Jonathan Greer is a 76 y.o. male who presents for Medicare Annual/Subsequent preventive examination.  Review of Systems     Cardiac Risk Factors include: advanced age (>17mn, >>62women);male gender;dyslipidemia     Objective:    There were no vitals filed for this visit. There is no height or weight on file to calculate BMI.     03/12/2022    8:23 AM 09/01/2020    2:06 PM 07/11/2020    7:55 AM 05/10/2020    1:49 PM 05/02/2020    7:40 AM 08/31/2019    8:40 AM 08/28/2018    1:35 PM  Advanced Directives  Does Patient Have a Medical Advance Directive? No No No No No No Yes  Type of ATransport plannerLiving will  Copy of [place]Ste. [place] Chart?       No - copy requested  Would patient like information on creating a medical advance directive? No - Patient declined No - Patient declined No - Patient declined   No - Patient declined     Current Medications (verified) Outpatient Encounter Medications as of 03/12/2022  Medication Sig   ezetimibe (ZETIA)  10 MG tablet Take 1 tablet (10 mg total) by mouth daily.   TRUE METRIX BLOOD GLUCOSE TEST test strip SMARTSIG:Via Meter   TRUEplus Lancets 33G MISC    No facility-administered encounter medications on file as of 03/12/2022.    Allergies (verified) Atorvastatin and Pravastatin   History: Past Medical History:  Diagnosis Date   Basal cell carcinoma    nose   Hyperlipemia 07/07/2015   Hyperlipidemia    Shingles 07/07/2015   Wears dentures    Past Surgical History:  Procedure Laterality Date   CATARACT EXTRACTION W/PHACO Left 03/13/2017   Procedure: CATARACT EXTRACTION PHACO AND INTRAOCULAR LENS PLACEMENT ([place] LEFT;  Surgeon: BLeandrew Koyanagi MD;  [place] [place] [facility]  Service: Ophthalmology;  Laterality: Left;  IVA TOPICAL LEFT   CATARACT EXTRACTION W/PHACO Right 04/17/2017   Procedure: CATARACT EXTRACTION PHACO AND INTRAOCULAR LENS PLACEMENT (IOC);  Surgeon: BLeandrew Koyanagi MD;  [place]  Service: Ophthalmology;  Laterality: Right;  IVA TOPICAL RIGHT   COLONOSCOPY WITH PROPOFOL N/A 07/27/2016   Procedure: COLONOSCOPY WITH PROPOFOL;  Surgeon: KJonathon Bellows MD;  Location: [facility] ENDOSCOPY;  Service: Endoscopy;  Laterality: N/A;   COLONOSCOPY WITH PROPOFOL N/A 05/02/2020   Procedure: COLONOSCOPY WITH PROPOFOL;  Surgeon: AJonathon Bellows MD;  [facility]  Service: Gastroenterology;  Laterality: N/A;   HERNIA REPAIR     MOHS SURGERY  01/10/2016   [facility] for [facility]  ADENOIDECTOMY  1960's   Family History  Problem Relation Age of Onset   Heart failure Mother    CVA Father    Lung cancer Brother    Liver cancer Maternal Grandmother    Cancer Paternal Grandmother    Cancer Paternal Grandfather    Bladder Cancer Neg Hx    Prostate cancer Neg Hx    Kidney cancer Neg Hx    Social History   Socioeconomic History   Marital status: Married    Spouse name: Not on file   Number of children: 0   Years of education: Not on  file   Highest education level: 10th grade  Occupational History   Occupation: Retired  Tobacco Use   Smoking status: Former    Packs/day: 2.00    Years: 20.00    Total pack years: 40.00    Types: Cigarettes    Quit date: 06/19/1975    Years since quitting: 46.7   Smokeless tobacco: Former    Types: Chew   Tobacco comments:    Quit in the 50s  Vaping Use   Vaping Use: Never used  Substance and Sexual Activity   Alcohol use: No    Alcohol/week: 0.0 standard drinks of alcohol   Drug use: No   Sexual activity: Not on file  Other Topics Concern   Not on file  Social History Narrative   Working 5 days a week in [employer] (06/2016)   Social Determinants of Health   Financial Resource Strain: Low Risk  (03/12/2022)   Overall Financial Resource Strain (CARDIA)    Difficulty of Paying Living Expenses: Not hard at all  Food Insecurity: No Food Insecurity (03/12/2022)   Hunger Vital Sign    Worried About Running Out of Food in the Last Year: Never true    [place] in the Last Year: Never true  Transportation Needs: No Transportation Needs (03/12/2022)   PRAPARE - Hydrologist (Medical): No    Lack of Transportation (Non-Medical): No  Physical Activity: Sufficiently Active (03/12/2022)   Exercise Vital Sign    Days of Exercise per Week: 4 days    Minutes of Exercise per Session: 60 min  Stress: No Stress Concern Present (03/12/2022)   [place]    Feeling of Stress : Not at all  Social Connections: Moderately Integrated (03/12/2022)   Social Connection and Isolation Panel [NHANES]    Frequency of Communication with Friends and Family: More than three times a week    Frequency of Social Gatherings with Friends and Family: Once a week    Attends Religious Services: More than 4 times per year    Active Member of [employer] or Organizations: No    Attends Music therapist:  Never    Marital Status: Married    Tobacco Counseling Counseling given: Not Answered Tobacco comments: Quit in the 70s   Clinical Intake:  Pre-visit preparation completed: Yes  Pain : No/denies pain     Nutritional Risks: None Diabetes: No  How often do you need to have someone help you when you read instructions, pamphlets, or other written materials from your doctor or pharmacy?: 1 - Never  Diabetic?no  Interpreter Needed?: No  Information entered by :: Kirke Shaggy, LPN   Activities of Daily Living    03/12/2022    8:23 AM 03/02/2022   10:38 AM  In your present state of health, do you have  any difficulty performing the following activities:  Hearing? 1 1  Vision? 0 0  Difficulty concentrating or making decisions? 1 1  Walking or climbing stairs? 0 0  Dressing or bathing? 0 0  Doing errands, shopping? 0 0  Preparing Food and eating ? N   Using the Toilet? N   In the past six months, have you accidently leaked urine? N   Do you have problems with loss of bowel control? N   Managing your Medications? N   Managing your Finances? N   Housekeeping or managing your Housekeeping? N     Patient Care Team: Birdie Sons, MD as PCP - General (Family Medicine) [place], [place] as Consulting Physician (Optometry) [name], MD as Consulting Physician (Gastroenterology) Tamsen Meek, MD as Referring Physician (Dermatology)  Indicate any recent Medical Services you may have received from other than [facility] providers in the past year (date may be approximate).     Assessment:   This is a routine wellness examination for Jonathan Greer.  Hearing/Vision screen Hearing Screening - Comments:: Has hearing aids, but won't wear Vision Screening - Comments:: Readers- [facility]  Dietary issues and exercise activities discussed: Current Exercise Habits: Home exercise routine, Type of exercise: walking, Time (Minutes): 60, Frequency (Times/Week): 4, Weekly  Exercise (Minutes/Week): 240, Intensity: Mild   Goals Addressed             This Visit's Progress    DIET - EAT MORE FRUITS AND VEGETABLES         Depression Screen    03/12/2022    8:21 AM 03/02/2022   10:38 AM 04/03/2021    8:22 AM 09/01/2020    2:05 PM 03/21/2020    9:21 AM 08/31/2019    8:39 AM 08/28/2018    1:39 PM  PHQ 2/9 Scores  PHQ - 2 Score 0 0 0 0 0 0 0  PHQ- 9 Score 0 0 0  0  0    Fall Risk    03/12/2022    8:23 AM 03/02/2022   10:38 AM 04/03/2021    8:22 AM 09/01/2020    2:06 PM 03/21/2020    9:20 AM  Fall Risk   Falls in the past year? 0 0 0 1 0  Comment    accidental trip   Number falls in past yr: 0 0 0 0 0  Injury with Fall? 0 0 0 1 0  Risk for fall due to : No Fall Risks No Fall Risks No Fall Risks    Follow up Falls prevention discussed;Falls evaluation completed Falls evaluation completed Falls prevention discussed Falls prevention discussed Falls evaluation completed    FALL RISK PREVENTION PERTAINING TO THE HOME:  Any stairs in or around the home? Yes  If so, are there any without handrails? No  Home free of loose throw rugs in walkways, pet beds, electrical cords, etc? Yes  Adequate lighting in your home to reduce risk of falls? Yes   ASSISTIVE DEVICES UTILIZED TO PREVENT FALLS:  Life alert? No  Use of a cane, walker or w/c? No  Grab bars in the bathroom? No  Shower chair or bench in shower? No  Elevated toilet seat or a handicapped toilet? No   Cognitive Function:        03/12/2022    8:25 AM 08/28/2018    1:39 PM  6CIT Screen  What Year? 0 points 0 points  What month? 0 points 0 points  What time? 0 points  0 points  Count back from 20 0 points 0 points  Months in reverse 0 points 0 points  Repeat phrase 0 points 0 points  Total Score 0 points 0 points    Immunizations Immunization History  Administered Date(s) Administered   Fluad Quad(high Dose 65+) 03/20/2019, 03/21/2020, 04/03/2021, 03/02/2022   Influenza, High Dose  Seasonal PF 06/27/2016, 04/26/2017, 05/02/2018   Pneumococcal Conjugate-13 10/07/2014   Pneumococcal Polysaccharide-23 03/18/2013   Tdap 06/30/2010, 01/15/2017    TDAP status: Up to date  Flu Vaccine status: Up to date  Pneumococcal vaccine status: Up to date  Covid-19 vaccine status: Declined, Education has been provided regarding the importance of this vaccine but patient still declined. Advised may receive this vaccine at local pharmacy or Health Dept.or vaccine clinic. Aware to provide a copy of the vaccination record if obtained from local pharmacy or Health Dept. Verbalized acceptance and understanding.  Qualifies for Shingles Vaccine? Yes   Zostavax completed No   Shingrix Completed?: No.    Education has been provided regarding the importance of this vaccine. Patient has been advised to call insurance company to determine out of pocket expense if they have not yet received this vaccine. Advised may also receive vaccine at local pharmacy or Health Dept. Verbalized acceptance and understanding.  Screening Tests Health Maintenance  Topic Date Due   COVID-19 Vaccine (1) Never done   Zoster Vaccines- Shingrix (1 of 2) Never done   COLONOSCOPY (Pts 45-11yr Insurance coverage will need to be confirmed)  05/02/2021   TETANUS/TDAP  01/16/2027   Pneumonia Vaccine 76 Years old  Completed   INFLUENZA VACCINE  Completed   Hepatitis C Screening  Completed   HPV VACCINES  Aged Out    Health Maintenance  Health Maintenance Due  Topic Date Due   COVID-19 Vaccine (1) Never done   Zoster Vaccines- Shingrix (1 of 2) Never done   COLONOSCOPY (Pts 45-449yrInsurance coverage will need to be confirmed)  05/02/2021    Colorectal cancer screening: Type of screening: Colonoscopy. Completed 05/02/20. Repeat every 1 years  Lung Cancer Screening: (Low Dose CT Chest recommended if Age 76-80ears, 30 pack-year currently smoking OR have quit w/in 15years.) does not qualify.   Additional  Screening:  Hepatitis C Screening: does qualify; Completed 06/28/16  Vision Screening: Recommended annual ophthalmology exams for early detection of glaucoma and other disorders of the eye. Is the patient up to date with their annual eye exam?  Yes  Who is the provider or what is the name of the office in which the patient attends annual eye exams? [facility] is not established with a provider, would they like to be referred to a provider to establish care? No .   Dental Screening: Recommended annual dental exams for proper oral hygiene  Community Resource Referral / Chronic Care Management: CRR required this visit?  No   CCM required this visit?  No      Plan:     I have personally reviewed and noted the following in the patient's chart:   Medical and social history Use of alcohol, tobacco or illicit drugs  Current medications and supplements including opioid prescriptions. Patient is not currently taking opioid prescriptions. Functional ability and status Nutritional status Physical activity Advanced directives List of other physicians Hospitalizations, surgeries, and ER visits in previous 12 months Vitals Screenings to include cognitive, depression, and falls Referrals and appointments  In addition, I have reviewed and discussed with patient certain preventive protocols, quality  metrics, and best practice recommendations. A written personalized care plan for preventive services as well as general preventive health recommendations were provided to patient.     Jonathan David, LPN   2/99/2426   Nurse Notes: none

## 2022-03-12 NOTE — Patient Instructions (Signed)
Jonathan Greer , Thank you for taking time to come for your Medicare Wellness Visit. I appreciate your ongoing commitment to your health goals. Please review the following plan we discussed and let me know if I can assist you in the future.   Screening recommendations/referrals: Colonoscopy: 05/02/20, referral sent Recommended yearly ophthalmology/optometry visit for glaucoma screening and checkup Recommended yearly dental visit for hygiene and checkup  Vaccinations: Influenza vaccine: 03/02/22 Pneumococcal vaccine: 10/07/14 Tdap vaccine: 01/15/17 Shingles vaccine: n/d   Covid-19: n/d  Advanced directives: no  Conditions/risks identified: none  Next appointment: Follow up in one year for your annual wellness visit. 03/14/23 @ 8:15 am by phone  Preventive Care 65 Years and Older, Male Preventive care refers to lifestyle choices and visits with your health care provider that can promote health and wellness. What does preventive care include? A yearly physical exam. This is also called an annual well check. Dental exams once or twice a year. Routine eye exams. Ask your health care provider how often you should have your eyes checked. Personal lifestyle choices, including: Daily care of your teeth and gums. Regular physical activity. Eating a healthy diet. Avoiding tobacco and drug use. Limiting alcohol use. Practicing safe sex. Taking low doses of aspirin every day. Taking vitamin and mineral supplements as recommended by your health care provider. What happens during an annual well check? The services and screenings done by your health care provider during your annual well check will depend on your age, overall health, lifestyle risk factors, and family history of disease. Counseling  Your health care provider may ask you questions about your: Alcohol use. Tobacco use. Drug use. Emotional well-being. Home and relationship well-being. Sexual activity. Eating habits. History of  falls. Memory and ability to understand (cognition). Work and work Statistician. Screening  You may have the following tests or measurements: Height, weight, and BMI. Blood pressure. Lipid and cholesterol levels. These may be checked every 5 years, or more frequently if you are over 34 years old. Skin check. Lung cancer screening. You may have this screening every year starting at age 33 if you have a 30-pack-year history of smoking and currently smoke or have quit within the past 15 years. Fecal occult blood test (FOBT) of the stool. You may have this test every year starting at age 16. Flexible sigmoidoscopy or colonoscopy. You may have a sigmoidoscopy every 5 years or a colonoscopy every 10 years starting at age 33. Prostate cancer screening. Recommendations will vary depending on your family history and other risks. Hepatitis C blood test. Hepatitis B blood test. Sexually transmitted disease (STD) testing. Diabetes screening. This is done by checking your blood sugar (glucose) after you have not eaten for a while (fasting). You may have this done every 1-3 years. Abdominal aortic aneurysm (AAA) screening. You may need this if you are a current or former smoker. Osteoporosis. You may be screened starting at age 40 if you are at high risk. Talk with your health care provider about your test results, treatment options, and if necessary, the need for more tests. Vaccines  Your health care provider may recommend certain vaccines, such as: Influenza vaccine. This is recommended every year. Tetanus, diphtheria, and acellular pertussis (Tdap, Td) vaccine. You may need a Td booster every 10 years. Zoster vaccine. You may need this after age 20. Pneumococcal 13-valent conjugate (PCV13) vaccine. One dose is recommended after age 12. Pneumococcal polysaccharide (PPSV23) vaccine. One dose is recommended after age 32. Talk to your health care  provider about which screenings and vaccines you need and  how often you need them. This information is not intended to replace advice given to you by your health care provider. Make sure you discuss any questions you have with your health care provider. Document Released: 07/01/2015 Document Revised: 02/22/2016 Document Reviewed: 04/05/2015 Elsevier Interactive Patient Education  2017 [place] Prevention in the Home Falls can cause injuries. They can happen to people of all ages. There are many things you can do to make your home safe and to help prevent falls. What can I do on the outside of my home? Regularly fix the edges of walkways and driveways and fix any cracks. Remove anything that might make you trip as you walk through a door, such as a raised step or threshold. Trim any bushes or trees on the path to your home. Use bright outdoor lighting. Clear any walking paths of anything that might make someone trip, such as rocks or tools. Regularly check to see if handrails are loose or broken. Make sure that both sides of any steps have handrails. Any raised decks and porches should have guardrails on the edges. Have any leaves, snow, or ice cleared regularly. Use sand or salt on walking paths during winter. Clean up any spills in your garage right away. This includes oil or grease spills. What can I do in the bathroom? Use night lights. Install grab bars by the toilet and in the tub and shower. Do not use towel bars as grab bars. Use non-skid mats or decals in the tub or shower. If you need to sit down in the shower, use a plastic, non-slip stool. Keep the floor dry. Clean up any water that spills on the floor as soon as it happens. Remove soap buildup in the tub or shower regularly. Attach bath mats securely with double-sided non-slip rug tape. Do not have throw rugs and other things on the floor that can make you trip. What can I do in the bedroom? Use night lights. Make sure that you have a light by your bed that is easy to  reach. Do not use any sheets or blankets that are too big for your bed. They should not hang down onto the floor. Have a firm chair that has side arms. You can use this for support while you get dressed. Do not have throw rugs and other things on the floor that can make you trip. What can I do in the kitchen? Clean up any spills right away. Avoid walking on wet floors. Keep items that you use a lot in easy-to-reach places. If you need to reach something above you, use a strong step stool that has a grab bar. Keep electrical cords out of the way. Do not use floor polish or wax that makes floors slippery. If you must use wax, use non-skid floor wax. Do not have throw rugs and other things on the floor that can make you trip. What can I do with my stairs? Do not leave any items on the stairs. Make sure that there are handrails on both sides of the stairs and use them. Fix handrails that are broken or loose. Make sure that handrails are as long as the stairways. Check any carpeting to make sure that it is firmly attached to the stairs. Fix any carpet that is loose or worn. Avoid having throw rugs at the top or bottom of the stairs. If you do have throw rugs, attach them to the  floor with carpet tape. Make sure that you have a light switch at the top of the stairs and the bottom of the stairs. If you do not have them, ask someone to add them for you. What else can I do to help prevent falls? Wear shoes that: Do not have high heels. Have rubber bottoms. Are comfortable and fit you well. Are closed at the toe. Do not wear sandals. If you use a stepladder: Make sure that it is fully opened. Do not climb a closed stepladder. Make sure that both sides of the stepladder are locked into place. Ask someone to hold it for you, if possible. Clearly mark and make sure that you can see: Any grab bars or handrails. First and last steps. Where the edge of each step is. Use tools that help you move  around (mobility aids) if they are needed. These include: Canes. Walkers. Scooters. Crutches. Turn on the lights when you go into a dark area. Replace any light bulbs as soon as they burn out. Set up your furniture so you have a clear path. Avoid moving your furniture around. If any of your floors are uneven, fix them. If there are any pets around you, be aware of where they are. Review your medicines with your doctor. Some medicines can make you feel dizzy. This can increase your chance of falling. Ask your doctor what other things that you can do to help prevent falls. This information is not intended to replace advice given to you by your health care provider. Make sure you discuss any questions you have with your health care provider. Document Released: 03/31/2009 Document Revised: 11/10/2015 Document Reviewed: 07/09/2014 Elsevier Interactive Patient Education  2017 [employer].

## 2022-03-14 ENCOUNTER — Telehealth: Payer: Self-pay | Admitting: Gastroenterology

## 2022-03-14 ENCOUNTER — Other Ambulatory Visit: Payer: Self-pay

## 2022-03-14 ENCOUNTER — Telehealth: Payer: Self-pay

## 2022-03-14 DIAGNOSIS — Z86010 Personal history of colonic polyps: Secondary | ICD-10-CM

## 2022-03-14 MED ORDER — CLENPIQ 10-3.5-12 MG-GM -GM/160ML PO SOLN: 1.0000 | Freq: Once | ORAL | 0 refills | Status: AC

## 2022-03-14 NOTE — Telephone Encounter (Signed)
Gastroenterology Pre-Procedure Review  Request Date: 04/17/22 Requesting Physician: Dr. Vicente Males  PATIENT REVIEW QUESTIONS: The patient responded to the following health history questions as indicated:    1. Are you having any GI issues? no 2. Do you have a personal history of Polyps? yes (last colonoscopy performed by Dr. Vicente Males 05/02/20 51m polyp removed from transverse colon) 3. Do you have a family history of Colon Cancer or Polyps? no 4. Diabetes Mellitus? no 5. Joint replacements in the past 12 months?no 6. Major health problems in the past 3 months?no 7. Any artificial heart valves, MVP, or defibrillator?no    MEDICATIONS & ALLERGIES:    Patient reports the following regarding taking any anticoagulation/antiplatelet therapy:   Plavix, Coumadin, Eliquis, Xarelto, Lovenox, Pradaxa, Brilinta, or Effient? no Aspirin? no  Patient confirms/reports the following medications:  Current Outpatient Medications  Medication Sig Dispense Refill   ezetimibe (ZETIA) 10 MG tablet Take 1 tablet (10 mg total) by mouth daily. 90 tablet 2   meloxicam (MOBIC) 15 MG tablet Take 1 tablet (15 mg total) by mouth daily. 30 tablet 0   TRUE METRIX BLOOD GLUCOSE TEST test strip SMARTSIG:Via Meter     TRUEplus Lancets 33G MISC      No current facility-administered medications for this visit.    Patient confirms/reports the following allergies:  Allergies  Allergen Reactions   Atorvastatin     Hip and knee pain   Pravastatin     Hip pain    No orders of the defined types were placed in this encounter.   AUTHORIZATION INFORMATION Primary Insurance: 1D#: Group #:  Secondary Insurance: 1D#: Group #:  SCHEDULE INFORMATION: Date: 04/17/22 Time: [place]

## 2022-03-14 NOTE — Telephone Encounter (Signed)
Per pt wife would like to schedule colonoscopy for pt. Please call her number due to pt hard of hearing.

## 2022-03-27 ENCOUNTER — Telehealth: Payer: Self-pay

## 2022-03-27 NOTE — Telephone Encounter (Signed)
Done, ready to pick up.

## 2022-03-27 NOTE — Telephone Encounter (Signed)
Copied from [place] [id number]. Topic: General - Inquiry >> Mar 27, 2022 10:30 AM Marcellus Scott wrote: Reason for CRM: Pt's wife stated pt has jury duty on October 25th and is requesting a letter to get him out of jury duty due to pts medical issues. Wife mentioned he cannot go in due to issues with his back and pinched nerve.  The wife is requesting to pick-up letter.  Please advise.

## 2022-03-28 NOTE — Telephone Encounter (Signed)
Patient advised.

## 2022-04-04 ENCOUNTER — Encounter: Payer: Medicare HMO | Admitting: Family Medicine

## 2022-04-06 ENCOUNTER — Other Ambulatory Visit: Payer: Self-pay | Admitting: Family Medicine

## 2022-04-13 ENCOUNTER — Other Ambulatory Visit: Payer: Self-pay | Admitting: Family Medicine

## 2022-04-13 NOTE — Telephone Encounter (Signed)
Requested medication (s) are due for refill today: possibly   Requested medication (s) are on the active medication list: no  Last refill:  unsure  Future visit scheduled: yes  Notes to clinic:  Unable to refill per protocol, Rx expired. Medication is not on the current med list, routing for approval.      Requested Prescriptions  Pending Prescriptions Disp Refills   atorvastatin (LIPITOR) 20 MG tablet [Pharmacy Med Name: ATORVASTATIN 20 MG TABLET] 12 tablet 12    Sig: TAKE ONE TABLET THREE DAYS A WEEK     Cardiovascular:  Antilipid - Statins Failed - 04/13/2022 11:45 AM      Failed - Lipid Panel in normal range within the last 12 months    Cholesterol, Total  Date Value Ref Range Status  04/03/2021 170 100 - 199 mg/dL Final   LDL Chol Calc (NIH)  Date Value Ref Range Status  04/03/2021 90 0 - 99 mg/dL Final   HDL  Date Value Ref Range Status  04/03/2021 59 >39 mg/dL Final   Triglycerides  Date Value Ref Range Status  04/03/2021 121 0 - 149 mg/dL Final         Passed - Patient is not pregnant      Passed - Valid encounter within last 12 months    Recent Outpatient Visits           1 month ago Lumbar radiculopathy   [facility] Birdie Sons, MD   5 months ago Prediabetes   [facility] Birdie Sons, MD   1 year ago Annual physical exam   [facility] Birdie Sons, MD   1 year ago Hyperlipidemia, unspecified hyperlipidemia type   [facility] Birdie Sons, MD   1 year ago Hyperlipidemia, unspecified hyperlipidemia type   [facility] Birdie Sons, MD       Future Appointments             In 2 months Fisher, Kirstie Peri, MD [facility], [place]

## 2022-04-16 ENCOUNTER — Other Ambulatory Visit: Payer: Self-pay

## 2022-04-16 NOTE — Progress Notes (Signed)
Colonoscopy prep changed from Suprep to Golytely.  This was called to the pharmacy to make sure that patient got prep in time for tomorrows procedures.  Rx was not sent upon scheduling.  Thanks,  [place], [place]

## 2022-04-17 ENCOUNTER — Ambulatory Visit
Admission: RE | Admit: 2022-04-17 | Discharge: 2022-04-17 | Disposition: A | Discharge status: Home / Self Care | Payer: Medicare HMO | Attending: Gastroenterology | Admitting: Gastroenterology

## 2022-04-17 ENCOUNTER — Ambulatory Visit: Payer: Medicare HMO

## 2022-04-17 ENCOUNTER — Encounter: Payer: Self-pay | Admitting: Gastroenterology

## 2022-04-17 ENCOUNTER — Encounter
Admission: RE | Disposition: A | Discharge status: Home / Self Care | Payer: Self-pay | Source: Home / Self Care | Attending: Gastroenterology

## 2022-04-17 ENCOUNTER — Other Ambulatory Visit: Payer: Self-pay

## 2022-04-17 DIAGNOSIS — K5730 Diverticulosis of large intestine without perforation or abscess without bleeding: Secondary | ICD-10-CM | POA: Insufficient documentation

## 2022-04-17 DIAGNOSIS — K640 First degree hemorrhoids: Secondary | ICD-10-CM | POA: Insufficient documentation

## 2022-04-17 DIAGNOSIS — D126 Benign neoplasm of colon, unspecified: Secondary | ICD-10-CM | POA: Diagnosis not present

## 2022-04-17 DIAGNOSIS — K635 Polyp of colon: Secondary | ICD-10-CM | POA: Diagnosis not present

## 2022-04-17 DIAGNOSIS — Z87891 Personal history of nicotine dependence: Secondary | ICD-10-CM | POA: Insufficient documentation

## 2022-04-17 DIAGNOSIS — Z1211 Encounter for screening for malignant neoplasm of colon: Secondary | ICD-10-CM | POA: Diagnosis not present

## 2022-04-17 DIAGNOSIS — E785 Hyperlipidemia, unspecified: Secondary | ICD-10-CM | POA: Diagnosis not present

## 2022-04-17 DIAGNOSIS — D123 Benign neoplasm of transverse colon: Secondary | ICD-10-CM | POA: Insufficient documentation

## 2022-04-17 DIAGNOSIS — D122 Benign neoplasm of ascending colon: Secondary | ICD-10-CM | POA: Insufficient documentation

## 2022-04-17 DIAGNOSIS — Z860100 Personal history of colon polyps, unspecified: Secondary | ICD-10-CM

## 2022-04-17 DIAGNOSIS — Z85828 Personal history of other malignant neoplasm of skin: Secondary | ICD-10-CM | POA: Insufficient documentation

## 2022-04-17 HISTORY — PX: COLONOSCOPY WITH PROPOFOL: SHX5780

## 2022-04-17 HISTORY — DX: Polyp of colon: K63.5

## 2022-04-17 SURGERY — COLONOSCOPY WITH PROPOFOL
Anesthesia: General

## 2022-04-17 MED ORDER — DEXMEDETOMIDINE HCL IN NACL 80 MCG/20ML IV SOLN
INTRAVENOUS | Status: AC
  Filled 2022-04-17: qty 20

## 2022-04-17 MED ORDER — PROPOFOL 500 MG/50ML IV EMUL
INTRAVENOUS | Status: DC | PRN
  Administered 2022-04-17: 140 ug/kg/min via INTRAVENOUS

## 2022-04-17 MED ORDER — LIDOCAINE HCL (CARDIAC) PF 100 MG/5ML IV SOSY
PREFILLED_SYRINGE | INTRAVENOUS | Status: DC | PRN
  Administered 2022-04-17: 50 mg via INTRAVENOUS

## 2022-04-17 MED ORDER — PROPOFOL 10 MG/ML IV BOLUS
INTRAVENOUS | Status: DC | PRN
  Administered 2022-04-17: 80 mg via INTRAVENOUS
  Administered 2022-04-17: 20 mg via INTRAVENOUS

## 2022-04-17 MED ORDER — SODIUM CHLORIDE 0.9 % IV SOLN: INTRAVENOUS | Status: DC

## 2022-04-17 NOTE — H&P (Signed)
Jonathan Bellows, MD [address], [place], [place], [place], [address] [address], [place], [place], [place], [address] Phone: [phone]  Fax: [phone]  Primary Care Physician:  Birdie Sons, MD   Pre-Procedure History & Physical: HPI:  Jonathan Greer is a 76 y.o. male is here for an colonoscopy.   Past Medical History:  Diagnosis Date   Basal cell carcinoma    nose   Colon polyps    Hyperlipemia 07/07/2015   Hyperlipidemia    Shingles 07/07/2015   Wears dentures     Past Surgical History:  Procedure Laterality Date   CATARACT EXTRACTION W/PHACO Left 03/13/2017   Procedure: CATARACT EXTRACTION PHACO AND INTRAOCULAR LENS PLACEMENT ([place]) LEFT;  Surgeon: Leandrew Koyanagi, MD;  Location: [facility];  Service: Ophthalmology;  Laterality: Left;  IVA TOPICAL LEFT   CATARACT EXTRACTION W/PHACO Right 04/17/2017   Procedure: CATARACT EXTRACTION PHACO AND INTRAOCULAR LENS PLACEMENT (IOC);  Surgeon: Leandrew Koyanagi, MD;  Location: [facility];  Service: Ophthalmology;  Laterality: Right;  IVA TOPICAL RIGHT   COLONOSCOPY WITH PROPOFOL N/A 07/27/2016   Procedure: COLONOSCOPY WITH PROPOFOL;  Surgeon: Jonathan Bellows, MD;  Location: [facility] ENDOSCOPY;  Service: Endoscopy;  Laterality: N/A;   COLONOSCOPY WITH PROPOFOL N/A 05/02/2020   Procedure: COLONOSCOPY WITH PROPOFOL;  Surgeon: Jonathan Bellows, MD;  Location: [facility] ENDOSCOPY;  Service: Gastroenterology;  Laterality: N/A;   HERNIA REPAIR     MOHS SURGERY  01/10/2016   [facility] for [facility]  1960's    Prior to Admission medications   Medication Sig Start Date End Date Taking? Authorizing Provider  atorvastatin (LIPITOR) 20 MG tablet TAKE ONE TABLET THREE DAYS A WEEK 04/14/22  Yes Birdie Sons, MD  ezetimibe (ZETIA) 10 MG tablet Take 1 tablet (10 mg total) by mouth daily. 10/20/21  Yes Birdie Sons, MD  meloxicam (MOBIC) 15 MG tablet TAKE 1 TABLET (15 MG TOTAL) BY MOUTH DAILY.  04/06/22  Yes Birdie Sons, MD  TRUE METRIX BLOOD GLUCOSE TEST test strip SMARTSIG:Via Meter 03/05/22   [provider]  TRUEplus Lancets 33G MISC  03/05/22   [provider]    Allergies as of 03/14/2022 - Review Complete 03/12/2022  Allergen Reaction Noted   Atorvastatin  10/20/2021   Pravastatin  06/27/2020    Family History  Problem Relation Age of Onset   Heart failure Mother    CVA Father    Lung cancer Brother    Liver cancer Maternal Grandmother    Cancer Paternal Grandmother    Cancer Paternal Grandfather    Bladder Cancer Neg Hx    Prostate cancer Neg Hx    Kidney cancer Neg Hx     Social History   Socioeconomic History   Marital status: Married    Spouse name: Not on file   Number of children: 0   Years of education: Not on file   Highest education level: 10th grade  Occupational History   Occupation: Retired  Tobacco Use   Smoking status: Former    Packs/day: 2.00    Years: 20.00    Total pack years: 40.00    Types: Cigarettes    Quit date: 06/19/1975    Years since quitting: 46.8   Smokeless tobacco: Former    Types: Chew   Tobacco comments:    Quit in the 59s  Vaping Use   Vaping Use: Never used  Substance and Sexual Activity   Alcohol use: No  Comment: quit 50 hrs ago   Drug use: No   Sexual activity: Not on file  Other Topics Concern   Not on file  Social History Narrative   Working 5 days a week in [employer] (06/2016)   Social Determinants of Health   Financial Resource Strain: Low Risk  (03/12/2022)   Overall Financial Resource Strain (CARDIA)    Difficulty of Paying Living Expenses: Not hard at all  Food Insecurity: No Food Insecurity (03/12/2022)   Hunger Vital Sign    Worried About Running Out of Food in the Last Year: Never true    Ran Out of Food in the Last Year: Never true  Transportation Needs: No Transportation Needs (03/12/2022)   PRAPARE - Hydrologist (Medical): No     Lack of Transportation (Non-Medical): No  Physical Activity: Sufficiently Active (03/12/2022)   Exercise Vital Sign    Days of Exercise per Week: 4 days    Minutes of Exercise per Session: 60 min  Stress: No Stress Concern Present (03/12/2022)   [place]    Feeling of Stress : Not at all  Social Connections: Moderately Integrated (03/12/2022)   Social Connection and Isolation Panel [NHANES]    Frequency of Communication with Friends and Family: More than three times a week    Frequency of Social Gatherings with Friends and Family: Once a week    Attends Religious Services: More than 4 times per year    Active Member of [employer] or Organizations: No    Attends Archivist Meetings: Never    Marital Status: Married  Human resources officer Violence: Not At Risk (03/12/2022)   Humiliation, Afraid, Rape, and Kick questionnaire    Fear of Current or Ex-Partner: No    Emotionally Abused: No    Physically Abused: No    Sexually Abused: No    Review of Systems: See HPI, otherwise negative ROS  Physical Exam: BP (!) 150/99   Pulse 60   Temp (!) 96.6 F (35.9 C) (Temporal)   Resp 20   Ht '5\' 10"'$  (1.778 m)   Wt 113.4 kg   SpO2 99%   BMI 35.87 kg/m  General:   Alert,  pleasant and cooperative in NAD Head:  Normocephalic and atraumatic. Neck:  Supple; no masses or thyromegaly. Lungs:  Clear throughout to auscultation, normal respiratory effort.    Heart:  +S1, +S2, Regular rate and rhythm, No edema. Abdomen:  Soft, nontender and nondistended. Normal bowel sounds, without guarding, and without rebound.   Neurologic:  Alert and  oriented x4;  grossly normal neurologically.  Impression/Plan: [name] is here for an colonoscopy to be performed for surveillance due to prior history of colon polyps   Risks, benefits, limitations, and alternatives regarding  colonoscopy have been reviewed with the patient.  Questions  have been answered.  All parties agreeable.   Jonathan Bellows, MD  04/17/2022, 8:06 AM

## 2022-04-17 NOTE — Anesthesia Postprocedure Evaluation (Signed)
Anesthesia Post Note  Patient: Jonathan Greer  Procedure(s) Performed: COLONOSCOPY WITH PROPOFOL  Patient location during evaluation: Endoscopy Anesthesia Type: General Level of consciousness: awake and alert Pain management: pain level controlled Vital Signs Assessment: post-procedure vital signs reviewed and stable Respiratory status: spontaneous breathing, nonlabored ventilation and respiratory function stable Cardiovascular status: blood pressure returned to baseline and stable Postop Assessment: no apparent nausea or vomiting Anesthetic complications: no   No notable events documented.   Last Vitals:  Vitals:   04/17/22 0845 04/17/22 0855  BP: 109/72 120/74  Pulse: 60 (!) 54  Resp: 18 16  Temp:    SpO2: 99% 99%    Last Pain:  Vitals:   04/17/22 0855  TempSrc:   PainSc: 0-No pain                 Alphonsus Sias

## 2022-04-17 NOTE — Op Note (Signed)
[facility]enterology Patient Name: Jonathan Greer Procedure Date: 04/17/2022 8:09 AM MRN: [id number] Account #: [id number] Date of Birth: 09-10-1945 Admit Type: Outpatient Age: 76 Room: [facility] ENDO ROOM 2 Gender: Male Note Status: Finalized Instrument Name: Park Meo [id number] Procedure:             Colonoscopy Indications:           Surveillance: Piecemeal removal of large sessile                         adenoma last colonoscopy (< 3 yrs) Providers:             [name] MD, MD Referring MD:          Kirstie Peri. Caryn Section, MD (Referring MD) Medicines:             Monitored Anesthesia Care Complications:         No immediate complications. Procedure:             Pre-Anesthesia Assessment:                        - Prior to the procedure, a History and Physical was                         performed, and patient medications, allergies and                         sensitivities were reviewed. The patient's tolerance                         of previous anesthesia was reviewed.                        - The risks and benefits of the procedure and the                         sedation options and risks were discussed with the                         patient. All questions were answered and informed                         consent was obtained.                        - ASA Grade Assessment: II - A patient with mild                         systemic disease.                        After obtaining informed consent, the colonoscope was                         passed under direct vision. Throughout the procedure,                         the patient's blood pressure, pulse, and oxygen  saturations were monitored continuously. The                         Colonoscope was introduced through the anus and                         advanced to the the cecum, identified by the                         appendiceal orifice. The colonoscopy was performed                          with ease. The patient tolerated the procedure well.                         The quality of the bowel preparation was excellent.                         The appendiceal orifice was photographed. Findings:      The perianal and digital rectal examinations were normal.      Two sessile polyps were found in the ascending colon. The polyps were 5       to 6 mm in size. These polyps were removed with a cold snare. Resection       and retrieval were complete.      A 5 mm polyp was found in the transverse colon. The polyp was sessile.       The polyp was removed with a cold snare. Resection and retrieval were       complete.      Multiple medium-mouthed diverticula were found in the sigmoid colon.      Non-bleeding internal hemorrhoids were found during retroflexion. The       hemorrhoids were large and Grade I (internal hemorrhoids that do not       prolapse).      The exam was otherwise without abnormality on direct and retroflexion       views. Impression:            - Two 5 to 6 mm polyps in the ascending colon, removed                         with a cold snare. Resected and retrieved.                        - One 5 mm polyp in the transverse colon, removed with                         a cold snare. Resected and retrieved.                        - Diverticulosis in the sigmoid colon.                        - Non-bleeding internal hemorrhoids.                        - The examination was otherwise normal on direct and  retroflexion views. Recommendation:        - Discharge patient to home (with escort).                        - Resume previous diet.                        - Continue present medications.                        - Await pathology results.                        - Repeat colonoscopy is not recommended due to current                         age (54 years or older) for surveillance. Procedure Code(s):     --- Professional ---                         [phone], Colonoscopy, flexible; with removal of                         tumor(s), polyp(s), or other lesion(s) by snare                         technique Diagnosis Code(s):     --- Professional ---                        Z86.010, Personal history of colonic polyps                        D12.2, Benign neoplasm of ascending colon                        D12.3, Benign neoplasm of transverse colon (hepatic                         flexure or splenic flexure)                        K64.0, First degree hemorrhoids                        K57.30, Diverticulosis of large intestine without                         perforation or abscess without bleeding CPT copyright 2022 American Medical Association. All rights reserved. The codes documented in this report are preliminary and upon coder review may  be revised to meet current compliance requirements. [name], MD [name] MD, MD 04/17/2022 8:34:16 AM This report has been signed electronically. Number of Addenda: 0 Note Initiated On: 04/17/2022 8:09 AM Scope Withdrawal Time: 0 hours 12 minutes 8 seconds  Total Procedure Duration: 0 hours 15 minutes 28 seconds  Estimated Blood Loss:  Estimated blood loss: none.      [facility]

## 2022-04-17 NOTE — Transfer of Care (Signed)
Immediate Anesthesia Transfer of Care Note  Patient: Jonathan Greer  Procedure(s) Performed: COLONOSCOPY WITH PROPOFOL  Patient Location: Endoscopy Unit  Anesthesia Type:General  Level of Consciousness: drowsy  Airway & Oxygen Therapy: Patient Spontanous Breathing  Post-op Assessment: Report given to RN and Post -op Vital signs reviewed and stable  Post vital signs: Reviewed and stable  Last Vitals:  Vitals Value Taken Time  BP 108/64 04/17/22 0836  Temp 35.8 C 04/17/22 0835  Pulse 63 04/17/22 0836  Resp 17 04/17/22 0836  SpO2 97 % 04/17/22 0836  Vitals shown include unvalidated device data.  Last Pain:  Vitals:   04/17/22 0835  TempSrc: Temporal  PainSc: 0-No pain         Complications: No notable events documented.

## 2022-04-17 NOTE — Anesthesia Preprocedure Evaluation (Signed)
Anesthesia Evaluation  Patient identified by MRN, date of birth, ID band Patient awake    Reviewed: Allergy & Precautions, NPO status , Patient's Chart, lab work & pertinent test results  Airway Mallampati: III  TM Distance: >3 FB Neck ROM: full    Dental  (+) Upper Dentures, Lower Dentures   Pulmonary neg pulmonary ROS, former smoker,    Pulmonary exam normal        Cardiovascular Exercise Tolerance: Good negative cardio ROS Normal cardiovascular exam     Neuro/Psych negative neurological ROS  negative psych ROS   GI/Hepatic negative GI ROS, Neg liver ROS,   Endo/Other  negative endocrine ROS  Renal/GU Renal disease  negative genitourinary   Musculoskeletal   Abdominal   Peds  Hematology negative hematology ROS (+)   Anesthesia Other Findings Past Medical History: No date: Basal cell carcinoma     Comment:  nose No date: Colon polyps 07/07/2015: Hyperlipemia No date: Hyperlipidemia 07/07/2015: Shingles No date: Wears dentures  Past Surgical History: 03/13/2017: CATARACT EXTRACTION W/PHACO; Left     Comment:  Procedure: CATARACT EXTRACTION PHACO AND INTRAOCULAR               LENS PLACEMENT ([place]) LEFT;  Surgeon: Leandrew Koyanagi, MD;  Location: [place];  Service:               Ophthalmology;  Laterality: Left;  IVA TOPICAL LEFT 04/17/2017: CATARACT EXTRACTION W/PHACO; Right     Comment:  Procedure: CATARACT EXTRACTION PHACO AND INTRAOCULAR               LENS PLACEMENT ([facility]);  Surgeon: Leandrew Koyanagi, MD;              Location: [place];  Service: Ophthalmology;                Laterality: Right;  IVA TOPICAL RIGHT 07/27/2016: COLONOSCOPY WITH PROPOFOL; N/A     Comment:  Procedure: COLONOSCOPY WITH PROPOFOL;  Surgeon: [name], MD;  Location: [facility] ENDOSCOPY;  Service: Endoscopy;              Laterality: N/A; 05/02/2020: COLONOSCOPY WITH  PROPOFOL; N/A     Comment:  Procedure: COLONOSCOPY WITH PROPOFOL;  Surgeon: [name], MD;  Location: [facility] ENDOSCOPY;  Service:               Gastroenterology;  Laterality: N/A; No date: HERNIA REPAIR 01/10/2016: MOHS SURGERY     Comment:  [place] 1960's: TONSILLECTOMY AND ADENOIDECTOMY  BMI    Body Mass Index: 35.87 kg/m      Reproductive/Obstetrics negative OB ROS                             Anesthesia Physical Anesthesia Plan  ASA: 2  Anesthesia Plan: General   Post-op Pain Management:    Induction: Intravenous  PONV Risk Score and Plan: Propofol infusion and TIVA  Airway Management Planned: Natural Airway and Nasal Cannula  Additional Equipment:   Intra-op Plan:   Post-operative Plan:   Informed Consent: I have reviewed the patients History and Physical, chart, labs and discussed the procedure including the risks, benefits and alternatives for the proposed anesthesia with the  patient or authorized representative who has indicated his/her understanding and acceptance.     Dental Advisory Given  Plan Discussed with: Anesthesiologist, CRNA and Surgeon  Anesthesia Plan Comments: (Patient consented for risks of anesthesia including but not limited to:  - adverse reactions to medications - risk of airway placement if required - damage to eyes, teeth, lips or other oral mucosa - nerve damage due to positioning  - sore throat or hoarseness - Damage to heart, brain, nerves, lungs, other parts of body or loss of life  Patient voiced understanding.)        Anesthesia Quick Evaluation

## 2022-04-18 ENCOUNTER — Encounter: Payer: Self-pay | Admitting: Gastroenterology

## 2022-04-18 LAB — SURGICAL PATHOLOGY

## 2022-04-20 ENCOUNTER — Encounter: Payer: Self-pay | Admitting: Gastroenterology

## 2022-04-23 ENCOUNTER — Other Ambulatory Visit: Payer: Self-pay | Admitting: Family Medicine

## 2022-05-05 DIAGNOSIS — H524 Presbyopia: Secondary | ICD-10-CM | POA: Diagnosis not present

## 2022-06-15 NOTE — Progress Notes (Unsigned)
Complete physical exam   Patient: Jonathan Greer   DOB: [birth year]   76 y.o. Male  MRN: [id number] Visit Date: 06/19/2022  Today's healthcare provider: Lelon Huh, MD   No chief complaint on file.  Subjective    Jonathan Greer is a 76 y.o. male who presents today for a complete physical exam.  He reports consuming a {diet types:17450} diet. {Exercise:19826} He generally feels {well/fairly well/poorly:18703}. He reports sleeping {well/fairly well/poorly:18703}. He {does/does not:200015} have additional problems to discuss today.  HPI  -AWV: 03/12/22  Past Medical History:  Diagnosis Date   Basal cell carcinoma    nose   Colon polyps    Hyperlipemia 07/07/2015   Hyperlipidemia    Shingles 07/07/2015   Wears dentures    Past Surgical History:  Procedure Laterality Date   CATARACT EXTRACTION W/PHACO Left 03/13/2017   Procedure: CATARACT EXTRACTION PHACO AND INTRAOCULAR LENS PLACEMENT ([place]) LEFT;  Surgeon: Leandrew Koyanagi, MD;  Location: [place];  Service: Ophthalmology;  Laterality: Left;  IVA TOPICAL LEFT   CATARACT EXTRACTION W/PHACO Right 04/17/2017   Procedure: CATARACT EXTRACTION PHACO AND INTRAOCULAR LENS PLACEMENT ([facility]);  Surgeon: Leandrew Koyanagi, MD;  Location: [place];  Service: Ophthalmology;  Laterality: Right;  IVA TOPICAL RIGHT   COLONOSCOPY WITH PROPOFOL N/A 07/27/2016   Procedure: COLONOSCOPY WITH PROPOFOL;  Surgeon: [name], MD;  Location: [facility] ENDOSCOPY;  Service: Endoscopy;  Laterality: N/A;   COLONOSCOPY WITH PROPOFOL N/A 05/02/2020   Procedure: COLONOSCOPY WITH PROPOFOL;  Surgeon: [name], MD;  Location: [facility] ENDOSCOPY;  Service: Gastroenterology;  Laterality: N/A;   COLONOSCOPY WITH PROPOFOL N/A 04/17/2022   Procedure: COLONOSCOPY WITH PROPOFOL;  Surgeon: [name], MD;  Location: [facility] ENDOSCOPY;  Service: Gastroenterology;  Laterality: N/A;   HERNIA REPAIR     MOHS SURGERY  01/10/2016   [facility] for [place]  1960's   Social History   Socioeconomic History   Marital status: Married    Spouse name: Not on file   Number of children: 0   Years of education: Not on file   Highest education level: 10th grade  Occupational History   Occupation: Retired  Tobacco Use   Smoking status: Former    Packs/day: 2.00    Years: 20.00    Total pack years: 40.00    Types: Cigarettes    Quit date: 06/19/1975    Years since quitting: 47.0   Smokeless tobacco: Former    Types: Chew   Tobacco comments:    Quit in the 52s  Vaping Use   Vaping Use: Never used  Substance and Sexual Activity   Alcohol use: No    Comment: quit 50 hrs ago   Drug use: No   Sexual activity: Not on file  Other Topics Concern   Not on file  Social History Narrative   Working 5 days a week in [employer] (06/2016)   Social Determinants of Health   Financial Resource Strain: [place]  (03/12/2022)   Overall Financial Resource Strain (CARDIA)    Difficulty of Paying Living Expenses: Not hard at all  Food Insecurity: No Food Insecurity (03/12/2022)   Hunger Vital Sign    Worried About Running Out of Food in the Last Year: Never true    [place] in the Last Year: Never true  Transportation Needs: No Transportation Needs (03/12/2022)   PRAPARE - Hydrologist (Medical): No  Lack of Transportation (Non-Medical): No  Physical Activity: Sufficiently Active (03/12/2022)   Exercise Vital Sign    Days of Exercise per Week: 4 days    Minutes of Exercise per Session: 60 min  Stress: No Stress Concern Present (03/12/2022)   [place]    Feeling of Stress : Not at all  Social Connections: Moderately Integrated (03/12/2022)   Social Connection and Isolation Panel [NHANES]    Frequency of Communication with Friends and Family: More than three times a week    Frequency of Social Gatherings with  Friends and Family: Once a week    Attends Religious Services: More than 4 times per year    Active Member of [employer] or Organizations: No    Attends Archivist Meetings: Never    Marital Status: Married  Human resources officer Violence: Not At Risk (03/12/2022)   Humiliation, Afraid, Rape, and Kick questionnaire    Fear of Current or Ex-Partner: No    Emotionally Abused: No    Physically Abused: No    Sexually Abused: No   Family Status  Relation Name Status   Mother  Deceased at age 46   Father  Deceased at age 8   Brother  Deceased   MGM  Deceased   MGF  Deceased   [place]  Deceased   PGF  Deceased   Neg Hx  (Not Specified)   Family History  Problem Relation Age of Onset   Heart failure Mother    CVA Father    Lung cancer Brother    Liver cancer Maternal Grandmother    Cancer Paternal Grandmother    Cancer Paternal Grandfather    Bladder Cancer Neg Hx    Prostate cancer Neg Hx    Kidney cancer Neg Hx    Allergies  Allergen Reactions   Atorvastatin     Hip and knee pain   Pravastatin     Hip pain    Patient Care Team: Birdie Sons, MD as PCP - General (Family Medicine) [place], [place] as Consulting Physician (Optometry) [name], MD as Consulting Physician (Gastroenterology) Tamsen Meek, MD as Referring Physician (Dermatology)   Medications: Outpatient Medications Prior to Visit  Medication Sig   atorvastatin (LIPITOR) 20 MG tablet TAKE ONE TABLET THREE DAYS A WEEK   ezetimibe (ZETIA) 10 MG tablet Take 1 tablet (10 mg total) by mouth daily.   meloxicam (MOBIC) 15 MG tablet TAKE 1 TABLET (15 MG TOTAL) BY MOUTH DAILY.   TRUE METRIX BLOOD GLUCOSE TEST test strip SMARTSIG:Via Meter   TRUEplus Lancets 33G MISC    No facility-administered medications prior to visit.    Review of Systems  {Labs  Heme  Chem  Endocrine  Serology  Results Review (optional):23779}  Objective    There were no vitals taken for this visit. {Show  previous vital signs (optional):23777}   Physical Exam  ***  Last depression screening scores    03/12/2022    8:21 AM 03/02/2022   10:38 AM 04/03/2021    8:22 AM  PHQ 2/9 Scores  PHQ - 2 Score 0 0 0  PHQ- 9 Score 0 0 0   Last fall risk screening    03/12/2022    8:23 AM  [place] in the past year? 0  Number falls in past yr: 0  Injury with Fall? 0  Risk for fall due to : No Fall Risks  Follow up  Falls prevention discussed;Falls evaluation completed   Last Audit-C alcohol use screening    03/12/2022    8:21 AM  Alcohol Use Disorder Test (AUDIT)  1. How often do you have a drink containing alcohol? 0  2. How many drinks containing alcohol do you have on a typical day when you are drinking? 0  3. How often do you have six or more drinks on one occasion? 0  AUDIT-C Score 0   A score of 3 or more in women, and 4 or more in men indicates increased risk for alcohol abuse, EXCEPT if all of the points are from question 1   No results found for any visits on 06/19/22.  Assessment & Plan    Routine Health Maintenance and Physical Exam  Exercise Activities and Dietary recommendations  Goals      Diet      Recommend to continue with current diet plan of cutting out all foods that are white (focusing on carbs and sugars).       DIET - EAT MORE FRUITS AND VEGETABLES     DIET - INCREASE WATER INTAKE     Recommend to drink at least 6-8 8oz glasses of water per day.      Prevent falls     Recommend to remove any items from the home that may cause slips or trips.        Immunization History  Administered Date(s) Administered   Fluad Quad(high Dose 65+) 03/20/2019, 03/21/2020, 04/03/2021, 03/02/2022   Influenza, High Dose Seasonal PF 06/27/2016, 04/26/2017, 05/02/2018   Pneumococcal Conjugate-13 10/07/2014   Pneumococcal Polysaccharide-23 03/18/2013   Tdap 06/30/2010, 01/15/2017   Zoster Recombinat (Shingrix) 04/24/2022    Health Maintenance  Topic Date Due    COVID-19 Vaccine (1) Never done   Zoster Vaccines- Shingrix (2 of 2) 06/19/2022   Medicare Annual Wellness (AWV)  03/13/2023   COLONOSCOPY (Pts 45-14yr Insurance coverage will need to be confirmed)  04/18/2023   DTaP/Tdap/Td (3 - Td or Tdap) 01/16/2027   Pneumonia Vaccine 76 Years old  Completed   INFLUENZA VACCINE  Completed   Hepatitis C Screening  Completed   HPV VACCINES  Aged Out    Discussed health benefits of physical activity, and encouraged him to engage in regular exercise appropriate for his age and condition.  ***  No follow-ups on file.     {provider attestation***:1}   DLelon Huh MD  [phone](phone) [phone](fax)  [place]

## 2022-06-19 ENCOUNTER — Encounter: Payer: Self-pay | Admitting: Family Medicine

## 2022-06-19 ENCOUNTER — Ambulatory Visit (INDEPENDENT_AMBULATORY_CARE_PROVIDER_SITE_OTHER): Payer: Medicare Other | Admitting: Family Medicine

## 2022-06-19 VITALS — BP 136/88 | HR 53 | Wt 250.8 lb

## 2022-06-19 DIAGNOSIS — R7303 Prediabetes: Secondary | ICD-10-CM | POA: Diagnosis not present

## 2022-06-19 DIAGNOSIS — Z125 Encounter for screening for malignant neoplasm of prostate: Secondary | ICD-10-CM | POA: Diagnosis not present

## 2022-06-19 DIAGNOSIS — K760 Fatty (change of) liver, not elsewhere classified: Secondary | ICD-10-CM | POA: Diagnosis not present

## 2022-06-19 DIAGNOSIS — Z86010 Personal history of colonic polyps: Secondary | ICD-10-CM | POA: Diagnosis not present

## 2022-06-19 DIAGNOSIS — Z0000 Encounter for general adult medical examination without abnormal findings: Secondary | ICD-10-CM

## 2022-06-19 DIAGNOSIS — E785 Hyperlipidemia, unspecified: Secondary | ICD-10-CM

## 2022-06-20 LAB — PSA TOTAL (REFLEX TO FREE): Prostate Specific Ag, Serum: 0.6 ng/mL (ref 0.0–4.0)

## 2022-06-20 LAB — LIPID PANEL
Chol/HDL Ratio: 3 ratio (ref 0.0–5.0)
Cholesterol, Total: 184 mg/dL (ref 100–199)
HDL: 61 mg/dL (ref >39)
LDL Chol Calc (NIH): 99 mg/dL (ref 0–99)
Triglycerides: 138 mg/dL (ref 0–149)
VLDL Cholesterol Cal: 24 mg/dL (ref 5–40)

## 2022-06-20 LAB — CBC
Hematocrit: 49.7 % (ref 37.5–51.0)
Hemoglobin: 16.7 g/dL (ref 13.0–17.7)
MCH: 30.6 pg (ref 26.6–33.0)
MCHC: 33.6 g/dL (ref 31.5–35.7)
MCV: 91 fL (ref 79–97)
Platelets: 185 10*3/uL (ref 150–450)
RBC: 5.46 x10E6/uL (ref 4.14–5.80)
RDW: 13.1 % (ref 11.6–15.4)
WBC: 5.7 10*3/uL (ref 3.4–10.8)

## 2022-06-20 LAB — COMPREHENSIVE METABOLIC PANEL
ALT: 31 IU/L (ref 0–44)
AST: 24 IU/L (ref 0–40)
Albumin/Globulin Ratio: 1.5 (ref 1.2–2.2)
Albumin: 4.6 g/dL (ref 3.8–4.8)
Alkaline Phosphatase: 51 IU/L (ref 44–121)
BUN/Creatinine Ratio: 14 (ref 10–24)
BUN: 16 mg/dL (ref 8–27)
Bilirubin Total: 0.9 mg/dL (ref 0.0–1.2)
CO2: 18 mmol/L — ABNORMAL LOW (ref 20–29)
Calcium: 9.8 mg/dL (ref 8.6–10.2)
Chloride: 104 mmol/L (ref 96–106)
Creatinine, Ser: 1.11 mg/dL (ref 0.76–1.27)
Globulin, Total: 3.1 g/dL (ref 1.5–4.5)
Glucose: 99 mg/dL (ref 70–99)
Potassium: 4.5 mmol/L (ref 3.5–5.2)
Sodium: 140 mmol/L (ref 134–144)
Total Protein: 7.7 g/dL (ref 6.0–8.5)
eGFR: 69 mL/min/{1.73_m2} (ref >59)

## 2022-06-20 LAB — HEMOGLOBIN A1C
Est. average glucose Bld gHb Est-mCnc: 131 mg/dL
Hgb A1c MFr Bld: 6.2 % — ABNORMAL HIGH (ref 4.8–5.6)

## 2022-07-11 ENCOUNTER — Other Ambulatory Visit: Payer: Self-pay | Admitting: Family Medicine

## 2022-08-10 ENCOUNTER — Ambulatory Visit (INDEPENDENT_AMBULATORY_CARE_PROVIDER_SITE_OTHER): Payer: Medicare Other | Admitting: Family Medicine

## 2022-08-10 ENCOUNTER — Ambulatory Visit
Admission: RE | Admit: 2022-08-10 | Discharge: 2022-08-10 | Disposition: A | Discharge status: Home / Self Care | Payer: Medicare Other | Source: Ambulatory Visit | Attending: Family Medicine | Admitting: Family Medicine

## 2022-08-10 ENCOUNTER — Ambulatory Visit
Admission: RE | Admit: 2022-08-10 | Discharge: 2022-08-10 | Disposition: A | Discharge status: Home / Self Care | Payer: Medicare Other | Attending: Family Medicine | Admitting: Family Medicine

## 2022-08-10 VITALS — BP 145/84 | HR 60 | Temp 97.7°F | Wt 254.0 lb

## 2022-08-10 DIAGNOSIS — M19012 Primary osteoarthritis, left shoulder: Secondary | ICD-10-CM | POA: Diagnosis not present

## 2022-08-10 DIAGNOSIS — M25512 Pain in left shoulder: Secondary | ICD-10-CM

## 2022-08-10 DIAGNOSIS — L723 Sebaceous cyst: Secondary | ICD-10-CM | POA: Diagnosis not present

## 2022-08-10 DIAGNOSIS — R2232 Localized swelling, mass and lump, left upper limb: Secondary | ICD-10-CM | POA: Diagnosis not present

## 2022-08-10 NOTE — Progress Notes (Signed)
      Established patient visit   Patient: Jonathan Greer   DOB: 11/02/1945   77 y.o. Male  MRN: [id number] Visit Date: 08/10/2022  Today's healthcare provider: Lelon Huh, MD   No chief complaint on file.  Subjective    HPI  Patient is a 77 year old male who presents for evaluation of a knot on his left shoulder.  He states he first noticed it 3 days ago.  It does not itch or have any drainage.  He said that the joint hurts and his arm is sore.  He states he was doing a lot of heavy lifting the day before he noticed lump. He does note that he has a similar knot on the same side of his neck that has been present for probably 20 years.  He is concerned about the chance of cancer as he relates several of his siblings have had cancer of various types.  Medications: Outpatient Medications Prior to Visit  Medication Sig   atorvastatin (LIPITOR) 20 MG tablet TAKE ONE TABLET THREE DAYS A WEEK   TRUE METRIX BLOOD GLUCOSE TEST test strip SMARTSIG:Via Meter   TRUEplus Lancets 33G MISC    [DISCONTINUED] meloxicam (MOBIC) 15 MG tablet TAKE 1 TABLET (15 MG TOTAL) BY MOUTH DAILY. (Patient not taking: Reported on 06/19/2022)   No facility-administered medications prior to visit.    Review of Systems  Constitutional:  Negative for appetite change, chills and fever.  Respiratory:  Negative for chest tightness, shortness of breath and wheezing.   Cardiovascular:  Negative for chest pain and palpitations.  Gastrointestinal:  Negative for abdominal pain, nausea and vomiting.       Objective    BP (!) 145/84 (BP Location: Right Arm, Patient Position: Sitting, Cuff Size: Large)   Pulse 60   Temp 97.7 F (36.5 C) (Oral)   Wt 254 lb (115.2 kg)   SpO2 96%   BMI 36.45 kg/m  Vitals:   08/10/22 0848 08/10/22 0850  BP: (!) 148/88 (!) 145/84  Pulse: 60   Temp: 97.7 F (36.5 C)   TempSrc: Oral   SpO2: 96%   Weight: 254 lb (115.2 kg)     Physical Exam   Semi-soft 3cm subcutaneous mass  over left upper anterior shoulder   Assessment & Plan     1. Sebaceous cyst (suspected diagnosis) He did first notice day after doing some heavy lifting and onset of shoulder pain, although exam is c/w sebaceous cyst.  2. Acute pain of left shoulder  - DG Shoulder Left; Future      The entirety of the information documented in the History of Present Illness, Review of Systems and Physical Exam were personally obtained by me. Portions of this information were initially documented by the CMA and reviewed by me for thoroughness and accuracy.     Lelon Huh, MD  [place] [phone] (phone) [phone] (fax)  [place]

## 2022-08-13 ENCOUNTER — Other Ambulatory Visit: Payer: Self-pay | Admitting: Family Medicine

## 2022-08-13 ENCOUNTER — Telehealth: Payer: Self-pay | Admitting: *Deleted

## 2022-08-13 DIAGNOSIS — R2232 Localized swelling, mass and lump, left upper limb: Secondary | ICD-10-CM

## 2022-08-13 NOTE — Telephone Encounter (Signed)
Pt's wife called for imaging results, on DPR.  Results reviewed, verbalizes understanding.  "Xray shows a calcification about the the lump in his shoulder is. Needs further evaluation with ultrasound, have placed order"

## 2022-08-23 ENCOUNTER — Ambulatory Visit
Admission: RE | Admit: 2022-08-23 | Discharge: 2022-08-23 | Disposition: A | Discharge status: Home / Self Care | Payer: Medicare Other | Source: Ambulatory Visit | Attending: Family Medicine | Admitting: Family Medicine

## 2022-08-23 DIAGNOSIS — R2232 Localized swelling, mass and lump, left upper limb: Secondary | ICD-10-CM | POA: Diagnosis not present

## 2022-08-28 ENCOUNTER — Telehealth: Payer: Self-pay

## 2022-08-28 NOTE — Telephone Encounter (Signed)
Copied from [place] [id number]. Topic: General - Other >> Aug 28, 2022 11:42 AM Chapman Fitch wrote: Reason for CRM: Pts wife called to get [place] results / please advise

## 2022-09-17 DIAGNOSIS — M25562 Pain in left knee: Secondary | ICD-10-CM | POA: Diagnosis not present

## 2022-09-17 DIAGNOSIS — M13862 Other specified arthritis, left knee: Secondary | ICD-10-CM | POA: Diagnosis not present

## 2022-10-06 ENCOUNTER — Other Ambulatory Visit: Payer: Self-pay | Admitting: Family Medicine

## 2022-10-06 DIAGNOSIS — R42 Dizziness and giddiness: Secondary | ICD-10-CM | POA: Diagnosis not present

## 2022-10-29 DIAGNOSIS — M1712 Unilateral primary osteoarthritis, left knee: Secondary | ICD-10-CM | POA: Diagnosis not present

## 2022-12-21 ENCOUNTER — Ambulatory Visit: Payer: Medicare Other | Admitting: Family Medicine

## 2022-12-25 ENCOUNTER — Ambulatory Visit (INDEPENDENT_AMBULATORY_CARE_PROVIDER_SITE_OTHER): Payer: Medicare Other | Admitting: Family Medicine

## 2022-12-25 VITALS — BP 129/84 | HR 59 | Temp 98.6°F | Wt 243.0 lb

## 2022-12-25 DIAGNOSIS — E785 Hyperlipidemia, unspecified: Secondary | ICD-10-CM

## 2022-12-25 DIAGNOSIS — R7303 Prediabetes: Secondary | ICD-10-CM | POA: Diagnosis not present

## 2022-12-25 DIAGNOSIS — I8393 Asymptomatic varicose veins of bilateral lower extremities: Secondary | ICD-10-CM

## 2022-12-25 DIAGNOSIS — O2200 Varicose veins of lower extremity in pregnancy, unspecified trimester: Secondary | ICD-10-CM | POA: Insufficient documentation

## 2022-12-25 NOTE — Progress Notes (Signed)
      Established patient visit   Patient: Jonathan Greer   DOB: 1945/11/11   77 y.o. Male  MRN: [id number] Visit Date: 12/25/2022  Today's healthcare provider: Mila Merry, MD   Chief Complaint  Patient presents with   Prediabetes    Patient was last seen 6 months ago. A1C at that time was 6.2    Hyperlipidemia   Subjective    HPI No complaints today. Is avoid sweets in diet. Still mows lawns every week day. Anticipates knee replacement surgery by Dr. Ernest Pine after mowing season is over.  He does inquire about varicose veins in legs which are not sore or painful.   Medications: Outpatient Medications Prior to Visit  Medication Sig   atorvastatin (LIPITOR) 20 MG tablet TAKE ONE TABLET THREE DAYS A WEEK   TRUE METRIX BLOOD GLUCOSE TEST test strip SMARTSIG:Via Meter   TRUEplus Lancets 33G MISC    No facility-administered medications prior to visit.        Objective    BP 129/84 (BP Location: Left Arm, Patient Position: Sitting, Cuff Size: Large)   Pulse (!) 59   Temp 98.6 F (37 C) (Oral)   Wt 243 lb (110.2 kg)   SpO2 95%   BMI 34.87 kg/m    Physical Exam  Some varicosities of both lower legs. No erythema or tenderness. No edema.   Assessment & Plan     1. Prediabetes  - Hemoglobin A1c  2. Hyperlipidemia, unspecified hyperlipidemia type He is tolerating atorvastatin well with no adverse effects.   - Lipid Panel With LDL/HDL Ratio  3. Varicose veins of both lower extremities, asymptomatic Encouraged to keep legs elevated when not ambulating.         Mila Merry, MD  [facility] [phone] (phone) [phone] (fax)  [facility]

## 2022-12-26 LAB — HEMOGLOBIN A1C
Est. average glucose Bld gHb Est-mCnc: 140 mg/dL
Hgb A1c MFr Bld: 6.5 % — ABNORMAL HIGH (ref 4.8–5.6)

## 2022-12-26 LAB — LIPID PANEL WITH LDL/HDL RATIO
Cholesterol, Total: 188 mg/dL (ref 100–199)
HDL: 66 mg/dL (ref >39)
LDL Chol Calc (NIH): 105 mg/dL — ABNORMAL HIGH (ref 0–99)
LDL/HDL Ratio: 1.6 ratio (ref 0.0–3.6)
Triglycerides: 96 mg/dL (ref 0–149)
VLDL Cholesterol Cal: 17 mg/dL (ref 5–40)

## 2023-01-01 DIAGNOSIS — M1712 Unilateral primary osteoarthritis, left knee: Secondary | ICD-10-CM | POA: Diagnosis not present

## 2023-01-05 DIAGNOSIS — M1712 Unilateral primary osteoarthritis, left knee: Principal | ICD-10-CM | POA: Insufficient documentation

## 2023-03-13 ENCOUNTER — Ambulatory Visit (INDEPENDENT_AMBULATORY_CARE_PROVIDER_SITE_OTHER): Payer: Medicare Other

## 2023-03-13 VITALS — Ht 70.0 in | Wt 243.0 lb

## 2023-03-13 DIAGNOSIS — Z0000 Encounter for general adult medical examination without abnormal findings: Secondary | ICD-10-CM

## 2023-03-13 NOTE — Patient Instructions (Addendum)
Mr. Fikes , Thank you for taking time to come for your Medicare Wellness Visit. I appreciate your ongoing commitment to your health goals. Please review the following plan we discussed and let me know if I can assist you in the future.   Referrals/Orders/Follow-Ups/Clinician Recommendations: none  This is a list of the screening recommended for you and due dates:  Health Maintenance  Topic Date Due   Flu Shot  01/17/2023   COVID-19 Vaccine (1 - 2023-24 season) Never done   Colon Cancer Screening  04/18/2023   Medicare Annual Wellness Visit  03/12/2024   DTaP/Tdap/Td vaccine (3 - Td or Tdap) 01/16/2027   Pneumonia Vaccine  Completed   Hepatitis C Screening  Completed   Zoster (Shingles) Vaccine  Completed   HPV Vaccine  Aged Out    Advanced directives: (Copy Requested) Please bring a copy of your health care power of attorney and living will to the office to be added to your chart at your convenience.  Next Medicare Annual Wellness Visit scheduled for next year:  03/16/2024 @ 8:55am telephone

## 2023-03-13 NOTE — Progress Notes (Signed)
Subjective:   Jonathan Greer is a 77 y.o. male who presents for Medicare Annual/Subsequent preventive examination.  Visit Complete: Virtual  I connected with  Jonathan Greer on 03/13/23 by a audio enabled telemedicine application and verified that I am speaking with the correct person using two identifiers.  Patient Location: Home  Provider Location: Home Office  I discussed the limitations of evaluation and management by telemedicine. The patient expressed understanding and agreed to proceed.  Because this visit was a virtual/telehealth visit, some criteria may be missing or patient reported. Any vitals not documented were not able to be obtained and vitals that have been documented are patient reported.    Patient Medicare AWV questionnaire was completed by the patient on (not done); I have confirmed that all information answered by patient is correct and no changes since this date. Cardiac Risk Factors include: advanced age (>40men, >22 women);dyslipidemia;male gender;obesity (BMI >30kg/m2)    Objective:    Today's Vitals   03/13/23 0817  Weight: 243 lb (110.2 kg)  Height: 5\' 10"  (1.778 m)   Body mass index is 34.87 kg/m.     03/13/2023    8:32 AM 04/17/2022    7:49 AM 03/12/2022    8:23 AM 09/01/2020    2:06 PM 07/11/2020    7:55 AM 05/10/2020    1:49 PM 05/02/2020    7:40 AM  Advanced Directives  Does Patient Have a Medical Advance Directive? Yes No No No No No No  Type of Estate agent of [place];Living will        Would patient like information on creating a medical advance directive?  No - Patient declined No - Patient declined No - Patient declined No - Patient declined      Current Medications (verified) Outpatient Encounter Medications as of 03/13/2023  Medication Sig   atorvastatin (LIPITOR) 20 MG tablet TAKE ONE TABLET THREE DAYS A WEEK   TRUE METRIX BLOOD GLUCOSE TEST test strip SMARTSIG:Via Meter   TRUEplus Lancets 33G MISC     No facility-administered encounter medications on file as of 03/13/2023.    Allergies (verified) Atorvastatin and Pravastatin   History: Past Medical History:  Diagnosis Date   Basal cell carcinoma    nose   Colon polyps    Hyperlipemia 07/07/2015   Hyperlipidemia    Shingles 07/07/2015   Wears dentures    Past Surgical History:  Procedure Laterality Date   CATARACT EXTRACTION W/PHACO Left 03/13/2017   Procedure: CATARACT EXTRACTION PHACO AND INTRAOCULAR LENS PLACEMENT (IOC) LEFT;  Surgeon: Lockie Mola, MD;  Location: [facility];  Service: Ophthalmology;  Laterality: Left;  IVA TOPICAL LEFT   CATARACT EXTRACTION W/PHACO Right 04/17/2017   Procedure: CATARACT EXTRACTION PHACO AND INTRAOCULAR LENS PLACEMENT (IOC);  Surgeon: Lockie Mola, MD;  Location: [facility];  Service: Ophthalmology;  Laterality: Right;  IVA TOPICAL RIGHT   COLONOSCOPY WITH PROPOFOL N/A 07/27/2016   Procedure: COLONOSCOPY WITH PROPOFOL;  Surgeon: Wyline Mood, MD;  Location: [facility] END[facility];  Service: Endoscopy;  Laterality: N/A;   COLONOSCOPY WITH PROPOFOL N/A 05/02/2020   Procedure: COLONOSCOPY WITH PROPOFOL;  Surgeon: Wyline Mood, MD;  Location: [facility];  Service: Gastroenterology;  Laterality: N/A;   COLONOSCOPY WITH PROPOFOL N/A 04/17/2022   Procedure: COLONOSCOPY WITH PROPOFOL;  Surgeon: Wyline Mood, MD;  Location: [facility] END[facility];  Service: Gastroenterology;  Laterality: N/A;   HERNIA REPAIR     MOHS SURGERY  01/10/2016   [facility]   TONSILLECTOMY AND  ADENOIDECTOMY  1960's   Family History  Problem Relation Age of Onset   Heart failure Mother    CVA Father    Lung cancer Brother    Throat cancer Brother    Liver cancer Maternal Grandmother    Cancer Paternal Grandmother    Cancer Paternal Grandfather    Lung cancer Brother    Bladder Cancer Neg Hx    Prostate cancer Neg Hx    Kidney cancer Neg Hx    Social History   Socioeconomic History   Marital  status: Married    Spouse name: Not on file   Number of children: 0   Years of education: Not on file   Highest education level: 10th grade  Occupational History   Occupation: Retired  Tobacco Use   Smoking status: Former    Current packs/day: 0.00    Average packs/day: 2.0 packs/day for 20.0 years (40.0 ttl pk-yrs)    Types: Cigarettes    Start date: 06/19/1955    Quit date: 06/19/1975    Years since quitting: 47.7   Smokeless tobacco: Former    Types: Chew   Tobacco comments:    Quit in the 93s  Vaping Use   Vaping status: Never Used  Substance and Sexual Activity   Alcohol use: No    Comment: quit 50 hrs ago   Drug use: No   Sexual activity: Not on file  Other Topics Concern   Not on file  Social History Narrative   Working 5 days a week in [employer]. (06/2016)   Social Determinants of Health   Financial Resource Strain: Low Risk  (03/13/2023)   Overall Financial Resource Strain (CARDIA)    Difficulty of Paying Living Expenses: Not hard at all  Food Insecurity: No Food Insecurity (03/13/2023)   Hunger Vital Sign    Worried About Running Out of Food in the Last Year: Never true    Ran Out of Food in the Last Year: Never true  Transportation Needs: No Transportation Needs (03/13/2023)   PRAPARE - Administrator, Civil Service (Medical): No    Lack of Transportation (Non-Medical): No  Physical Activity: Sufficiently Active (03/13/2023)   Exercise Vital Sign    Days of Exercise per Week: 4 days    Minutes of Exercise per Session: 60 min  Stress: No Stress Concern Present (03/13/2023)   [employer] of Occupational Health - Occupational Stress Questionnaire    Feeling of Stress : Not at all  Social Connections: Moderately Integrated (03/13/2023)   Social Connection and Isolation Panel [NHANES]    Frequency of Communication with Friends and Family: More than three times a week    Frequency of Social Gatherings with Friends and Family: Once a week    Attends  Religious Services: More than 4 times per year    Active Member of [employer] or Organizations: No    Attends Engineer, structural: Never    Marital Status: Married    Tobacco Counseling Counseling given: Not Answered Tobacco comments: Quit in the 70s   Clinical Intake:  Pre-visit preparation completed: Yes  Pain : No/denies pain     BMI - recorded: 34.87 Nutritional Status: BMI > 30  Obese Nutritional Risks: None Diabetes: No  How often do you need to have someone help you when you read instructions, pamphlets, or other written materials from your doctor or pharmacy?: 1 - Never  Interpreter Needed?: No  Comments: lives with wife and son Information entered by ::  B.Aarini Slee,LPN   Activities of Daily Living    03/13/2023    8:32 AM 08/10/2022    8:50 AM  In your present state of health, do you have any difficulty performing the following activities:  Hearing? 1 1  Vision? 0 0  Difficulty concentrating or making decisions? 0 0  Walking or climbing stairs? 0 0  Dressing or bathing? 0 0  Doing errands, shopping? 0 0  Preparing Food and eating ? N   Using the Toilet? N   In the past six months, have you accidently leaked urine? N   Do you have problems with loss of bowel control? N   Managing your Medications? N   Managing your Finances? N   Housekeeping or managing your Housekeeping? N     Patient Care Team: Malva Limes, MD as PCP - General (Family Medicine) [facility], [facility] as Consulting Physician (Optometry) Wyline Mood, MD as Consulting Physician (Gastroenterology) Madlyn Frankel, MD as Referring Physician (Dermatology)  Indicate any recent Medical Services you may have received from other than [facility] providers in the past year (date may be approximate).     Assessment:   This is a routine wellness examination for Jonathan Greer.  Hearing/Vision screen Hearing Screening - Comments:: Pt says he has hearing aids and hears sufficiently Vision  Screening - Comments:: Pt says uses readers only and sees good [facility] [address] [facility]   Goals Addressed             This Visit's Progress    Diet    No change    Recommend to continue with current diet plan of cutting out all foods that are white (focusing on carbs and sugars).      COMPLETED: DIET - EAT MORE FRUITS AND VEGETABLES   On track    COMPLETED: DIET - INCREASE WATER INTAKE   On track    Recommend to drink at least 6-8 8oz glasses of water per day.       Depression Screen    03/13/2023    8:26 AM 08/10/2022    8:50 AM 03/12/2022    8:21 AM 03/02/2022   10:38 AM 04/03/2021    8:22 AM 09/01/2020    2:05 PM 03/21/2020    9:21 AM  PHQ 2/9 Scores  PHQ - 2 Score 0 0 0 0 0 0 0  PHQ- 9 Score  0 0 0 0  0    Fall Risk    03/13/2023    8:21 AM 08/10/2022    8:49 AM 03/12/2022    8:23 AM 03/02/2022   10:38 AM 04/03/2021    8:22 AM  Fall Risk   Falls in the past year? 0 0 0 0 0  Number falls in past yr: 0 0 0 0 0  Injury with Fall? 0 0 0 0 0  Risk for fall due to : No Fall Risks  No Fall Risks No Fall Risks No Fall Risks  Follow up Education provided;Falls prevention discussed  Falls prevention discussed;Falls evaluation completed Falls evaluation completed Falls prevention discussed    MEDICARE RISK AT HOME: Medicare Risk at Home Any stairs in or around the home?: Yes If so, are there any without handrails?: Yes Home free of loose throw rugs in walkways, pet beds, electrical cords, etc?: Yes Adequate lighting in your home to reduce risk of falls?: Yes Life alert?: No Use of a cane, walker or w/c?: No Grab bars in the bathroom?: No Shower chair or  bench in shower?: No Elevated toilet seat or a handicapped toilet?: No  TIMED UP AND GO:  Was the test performed?  No    Cognitive Function:        03/13/2023    8:33 AM 03/12/2022    8:25 AM 08/28/2018    1:39 PM  6CIT Screen  What Year? 0 points 0 points 0 points  What month? 0 points 0 points 0 points  What  time? 0 points 0 points 0 points  Count back from 20 0 points 0 points 0 points  Months in reverse 4 points 0 points 0 points  Repeat phrase 2 points 0 points 0 points  Total Score 6 points 0 points 0 points    Immunizations Immunization History  Administered Date(s) Administered   Fluad Quad(high Dose 65+) 03/20/2019, 03/21/2020, 04/03/2021, 03/02/2022   Influenza, High Dose Seasonal PF 06/27/2016, 04/26/2017, 05/02/2018   Pneumococcal Conjugate-13 10/07/2014   Pneumococcal Polysaccharide-23 03/18/2013   Tdap 06/30/2010, 01/15/2017   Zoster Recombinant(Shingrix) 04/24/2022, 07/17/2022    TDAP status: Up to date  Flu Vaccine status: Up to date  Pneumococcal vaccine status: Up to date  Covid-19 vaccine status: Declined, Education has been provided regarding the importance of this vaccine but patient still declined. Advised may receive this vaccine at local pharmacy or Health Dept.or vaccine clinic. Aware to provide a copy of the vaccination record if obtained from local pharmacy or Health Dept. Verbalized acceptance and understanding.  Qualifies for Shingles Vaccine? Yes   Zostavax completed Yes   Shingrix Completed?: Yes  Screening Tests Health Maintenance  Topic Date Due   INFLUENZA VACCINE  01/17/2023   COVID-19 Vaccine (1 - 2023-24 season) Never done   Colonoscopy  04/18/2023   Medicare Annual Wellness (AWV)  03/12/2024   DTaP/Tdap/Td (3 - Td or Tdap) 01/16/2027   Pneumonia Vaccine 38+ Years old  Completed   Hepatitis C Screening  Completed   Zoster Vaccines- Shingrix  Completed   HPV VACCINES  Aged Out    Health Maintenance  Health Maintenance Due  Topic Date Due   INFLUENZA VACCINE  01/17/2023   COVID-19 Vaccine (1 - 2023-24 season) Never done   Colonoscopy  04/18/2023    Colorectal cancer screening: No longer required.   Lung Cancer Screening: (Low Dose CT Chest recommended if Age 1-80 years, 20 pack-year currently smoking OR have quit w/in 15years.) does  not qualify.   Lung Cancer Screening Referral: no  Additional Screening:  Hepatitis C Screening: does not qualify; Completed yes  Vision Screening: Recommended annual ophthalmology exams for early detection of glaucoma and other disorders of the eye. Is the patient up to date with their annual eye exam?  Yes  Who is the provider or what is the name of the office in which the patient attends annual eye exams? [facility] [address] [facility] If pt is not established with a provider, would they like to be referred to a provider to establish care? No .   Dental Screening: Recommended annual dental exams for proper oral hygiene  Diabetic Foot Exam: n/a  Community Resource Referral / Chronic Care Management: CRR required this visit?  No   CCM required this visit?  No   Plan:     I have personally reviewed and noted the following in the patient's chart:   Medical and social history Use of alcohol, tobacco or illicit drugs  Current medications and supplements including opioid prescriptions. Patient is not currently taking opioid prescriptions. Functional ability and status Nutritional  status Physical activity Advanced directives List of other physicians Hospitalizations, surgeries, and ER visits in previous 12 months Vitals Screenings to include cognitive, depression, and falls Referrals and appointments  In addition, I have reviewed and discussed with patient certain preventive protocols, quality metrics, and best practice recommendations. A written personalized care plan for preventive services as well as general preventive health recommendations were provided to patient.    Sue Lush, LPN   09/17/270   After Visit Summary: (Declined) Due to this being a telephonic visit, with patients personalized plan was offered to patient but patient Declined AVS at this time   Nurse Notes: The patient states he is doing well and has no concerns or questions at this time.

## 2023-05-03 NOTE — Discharge Instructions (Signed)

## 2023-05-06 DIAGNOSIS — M1712 Unilateral primary osteoarthritis, left knee: Secondary | ICD-10-CM | POA: Diagnosis not present

## 2023-05-07 ENCOUNTER — Other Ambulatory Visit: Payer: Self-pay | Admitting: Family Medicine

## 2023-05-07 NOTE — Telephone Encounter (Unsigned)
Copied from CRM [id number]. Topic: General - Other >> May 07, 2023 12:33 PM Everette C wrote: Reason for CRM: Medication Refill - Most Recent Primary Care Visit:  Provider: Sue Lush Department: [facility] Visit Type: MEDICARE AWV, SEQUENTIAL Date: 03/13/2023  Medication: atorvastatin (LIPITOR) 20 MG tablet [[id number]]  Has the patient contacted their pharmacy? Yes (Agent: If no, request that the patient contact the pharmacy for the refill. If patient does not wish to contact the pharmacy document the reason why and proceed with request.) (Agent: If yes, when and what did the pharmacy advise?)  Is this the correct pharmacy for this prescription? Yes If no, delete pharmacy and type the correct one.  This is the patient's preferred pharmacy:  [id number] [place], [place] - [address] [place] [place] [address] Phone: [phone] Fax: [phone]   Has the prescription been filled recently? No  Is the patient out of the medication? No  Has the patient been seen for an appointment in the last year OR does the patient have an upcoming appointment? Yes  Can we respond through MyChart? No  Agent: Please be advised that Rx refills may take up to 3 business days. We ask that you follow-up with your pharmacy.

## 2023-05-08 ENCOUNTER — Encounter
Admission: RE | Admit: 2023-05-08 | Discharge: 2023-05-08 | Disposition: A | Discharge status: Home / Self Care | Payer: Medicare Other | Source: Ambulatory Visit | Attending: Orthopedic Surgery | Admitting: Orthopedic Surgery

## 2023-05-08 VITALS — BP 132/67 | HR 63 | Resp 14 | Ht 70.0 in | Wt 251.1 lb

## 2023-05-08 DIAGNOSIS — Z01810 Encounter for preprocedural cardiovascular examination: Secondary | ICD-10-CM | POA: Diagnosis not present

## 2023-05-08 DIAGNOSIS — I4510 Unspecified right bundle-branch block: Secondary | ICD-10-CM | POA: Diagnosis not present

## 2023-05-08 DIAGNOSIS — R7303 Prediabetes: Secondary | ICD-10-CM | POA: Insufficient documentation

## 2023-05-08 DIAGNOSIS — M1712 Unilateral primary osteoarthritis, left knee: Secondary | ICD-10-CM | POA: Diagnosis not present

## 2023-05-08 DIAGNOSIS — Z01818 Encounter for other preprocedural examination: Secondary | ICD-10-CM | POA: Diagnosis not present

## 2023-05-08 DIAGNOSIS — R001 Bradycardia, unspecified: Secondary | ICD-10-CM | POA: Insufficient documentation

## 2023-05-08 HISTORY — DX: Prediabetes: R73.03

## 2023-05-08 HISTORY — DX: Diverticulosis of large intestine without perforation or abscess without bleeding: K57.30

## 2023-05-08 HISTORY — DX: Unilateral primary osteoarthritis, left knee: M17.12

## 2023-05-08 HISTORY — DX: Cyst of kidney, acquired: N28.1

## 2023-05-08 HISTORY — DX: Morbid (severe) obesity due to excess calories: E66.01

## 2023-05-08 HISTORY — DX: Personal history of urinary calculi: Z87.442

## 2023-05-08 HISTORY — DX: Personal history of colon polyps, unspecified: Z86.0100

## 2023-05-08 HISTORY — DX: Unspecified hemorrhoids: K64.9

## 2023-05-08 HISTORY — DX: Asymptomatic varicose veins of unspecified lower extremity: I83.90

## 2023-05-08 HISTORY — DX: Fatty (change of) liver, not elsewhere classified: K76.0

## 2023-05-08 HISTORY — DX: Elevated blood-pressure reading, without diagnosis of hypertension: R03.0

## 2023-05-08 LAB — CBC
HCT: 47.5 % (ref 39.0–52.0)
Hemoglobin: 16.5 g/dL (ref 13.0–17.0)
MCH: 31.4 pg (ref 26.0–34.0)
MCHC: 34.7 g/dL (ref 30.0–36.0)
MCV: 90.5 fL (ref 80.0–100.0)
Platelets: 198 10*3/uL (ref 150–400)
RBC: 5.25 MIL/uL (ref 4.22–5.81)
RDW: 13.7 % (ref 11.5–15.5)
WBC: 6.9 10*3/uL (ref 4.0–10.5)
nRBC: 0 % (ref 0.0–0.2)

## 2023-05-08 LAB — C-REACTIVE PROTEIN: CRP: 0.8 mg/dL (ref <1.0)

## 2023-05-08 LAB — COMPREHENSIVE METABOLIC PANEL
ALT: 35 U/L (ref 0–44)
AST: 30 U/L (ref 15–41)
Albumin: 4.4 g/dL (ref 3.5–5.0)
Alkaline Phosphatase: 40 U/L (ref 38–126)
Anion gap: 8 (ref 5–15)
BUN: 23 mg/dL (ref 8–23)
CO2: 24 mmol/L (ref 22–32)
Calcium: 10.7 mg/dL — ABNORMAL HIGH (ref 8.9–10.3)
Chloride: 106 mmol/L (ref 98–111)
Creatinine, Ser: 1.13 mg/dL (ref 0.61–1.24)
GFR, Estimated: >60 mL/min (ref >60)
Glucose, Bld: 83 mg/dL (ref 70–99)
Potassium: 4.2 mmol/L (ref 3.5–5.1)
Sodium: 138 mmol/L (ref 135–145)
Total Bilirubin: 0.9 mg/dL (ref <1.2)
Total Protein: 8.2 g/dL — ABNORMAL HIGH (ref 6.5–8.1)

## 2023-05-08 LAB — URINALYSIS, ROUTINE W REFLEX MICROSCOPIC
Bilirubin Urine: NEGATIVE
Glucose, UA: NEGATIVE mg/dL
Hgb urine dipstick: NEGATIVE
Ketones, ur: NEGATIVE mg/dL
Leukocytes,Ua: NEGATIVE
Nitrite: NEGATIVE
Protein, ur: NEGATIVE mg/dL
Specific Gravity, Urine: 1.019 (ref 1.005–1.030)
pH: 6 (ref 5.0–8.0)

## 2023-05-08 LAB — HEMOGLOBIN A1C
Hgb A1c MFr Bld: 6.2 % — ABNORMAL HIGH (ref 4.8–5.6)
Mean Plasma Glucose: 131.24 mg/dL

## 2023-05-08 LAB — SURGICAL PCR SCREEN
MRSA, PCR: NEGATIVE
Staphylococcus aureus: POSITIVE — AB

## 2023-05-08 LAB — SEDIMENTATION RATE: Sed Rate: 8 mm/h (ref 0–20)

## 2023-05-08 MED ORDER — ATORVASTATIN CALCIUM 20 MG PO TABS: ORAL_TABLET | ORAL | 0 refills | Status: DC

## 2023-05-08 NOTE — Telephone Encounter (Signed)
Requested Prescriptions  Pending Prescriptions Disp Refills   atorvastatin (LIPITOR) 20 MG tablet 90 tablet 0    Sig: TAKE ONE TABLET THREE DAYS A WEEK     Cardiovascular:  Antilipid - Statins Failed - 05/07/2023  1:27 PM      Failed - Lipid Panel in normal range within the last 12 months    Cholesterol, Total  Date Value Ref Range Status  12/25/2022 188 100 - 199 mg/dL Final   LDL Chol Calc (NIH)  Date Value Ref Range Status  12/25/2022 105 (H) 0 - 99 mg/dL Final   HDL  Date Value Ref Range Status  12/25/2022 66 >39 mg/dL Final   Triglycerides  Date Value Ref Range Status  12/25/2022 96 0 - 149 mg/dL Final         Passed - Patient is not pregnant      Passed - Valid encounter within last 12 months    Recent Outpatient Visits           4 months ago Prediabetes   [facility] Malva Limes, MD   9 months ago Sebaceous cyst   [facility] Malva Limes, MD   10 months ago Annual physical exam   [facility] Malva Limes, MD   1 year ago Lumbar radiculopathy   [facility] Malva Limes, MD   1 year ago Prediabetes   [facility] Malva Limes, MD       Future Appointments             In 1 month Fisher, Demetrios Isaacs, MD [facility]

## 2023-05-08 NOTE — Patient Instructions (Addendum)
Your procedure is scheduled on:05-15-23 Wednesday Report to the Registration Desk on the 1st floor of the [facility].Then proceed to the 2nd floor Surgery Desk To find out your arrival time, please call [phone] between 1PM - 3PM on:05-14-23 Tuesday If your arrival time is 6:00 am, do not arrive before that time as the [facility] entrance doors do not open until 6:00 am.  REMEMBER: Instructions that are not followed completely may result in serious medical risk, up to and including death; or upon the discretion of your surgeon and anesthesiologist your surgery may need to be rescheduled.  Do not eat food after midnight the night before surgery.  No gum chewing or hard candies.  You may however, drink CLEAR liquids up to 2 hours before you are scheduled to arrive for your surgery. Do not drink anything within 2 hours of your scheduled arrival time.  Clear liquids include: - water  - apple juice without pulp - gatorade (not RED colors) - black coffee or tea (Do NOT add milk or creamers to the coffee or tea) Do NOT drink anything that is not on this list.  In addition, your doctor has ordered for you to drink the provided:  Ensure Pre-Surgery Clear Carbohydrate Drink  Drinking this carbohydrate drink up to two hours before surgery helps to reduce insulin resistance and improve patient outcomes. Please complete drinking 2 hours before scheduled arrival time.  One week prior to surgery:Stop NOW (05-08-23) Stop Anti-inflammatories (NSAIDS) such as Advil, Aleve, Ibuprofen, Motrin, Naproxen, Naprosyn and Aspirin based products such as Excedrin, Goody's Powder, BC Powder. Stop ANY OVER THE COUNTER supplements until after surgery (Beet Root)  You may however, continue to take Tylenol if needed for pain up until the day of surgery.  Continue taking all of your other prescription medications up until the day of surgery.  Do NOT take any medication the day of surgery  No Alcohol for 24  hours before or after surgery.  No Smoking including e-cigarettes for 24 hours before surgery.  No chewable tobacco products for at least 6 hours before surgery.  No nicotine patches on the day of surgery.  Do not use any "recreational" drugs for at least a week (preferably 2 weeks) before your surgery.  Please be advised that the combination of cocaine and anesthesia may have negative outcomes, up to and including death. If you test positive for cocaine, your surgery will be cancelled.  On the morning of surgery brush your teeth with toothpaste and water, you may rinse your mouth with mouthwash if you wish. Do not swallow any toothpaste or mouthwash.  Use CHG Soap as directed on instruction sheet.  Do not wear jewelry, make-up, hairpins, clips or nail polish.  For welded (permanent) jewelry: bracelets, anklets, waist bands, etc.  Please have this removed prior to surgery.  If it is not removed, there is a chance that hospital personnel will need to cut it off on the day of surgery.  Do not wear lotions, powders, or perfumes.   Do not shave body hair from the neck down 48 hours before surgery.  Contact lenses, hearing aids and dentures may not be worn into surgery.  Do not bring valuables to the hospital. [facility] is not responsible for any missing/lost belongings or valuables.   Notify your doctor if there is any change in your medical condition (cold, fever, infection).  Wear comfortable clothing (specific to your surgery type) to the hospital.  After surgery, you can help  prevent lung complications by doing breathing exercises.  Take deep breaths and cough every 1-2 hours. Your doctor may order a device called an Incentive Spirometer to help you take deep breaths. When coughing or sneezing, hold a pillow firmly against your incision with both hands. This is called "splinting." Doing this helps protect your incision. It also decreases belly discomfort.  If you are being  admitted to the hospital overnight, leave your suitcase in the car. After surgery it may be brought to your room.  In case of increased patient census, it may be necessary for you, the patient, to continue your postoperative care in the Same Day Surgery department.  If you are being discharged the day of surgery, you will not be allowed to drive home. You will need a responsible individual to drive you home and stay with you for 24 hours after surgery.   If you are taking public transportation, you will need to have a responsible individual with you.  Please call the Pre-admissions Testing Dept. at [phone] if you have any questions about these instructions.  Surgery Visitation Policy:  Patients having surgery or a procedure may have two visitors.  Children under the age of 52 must have an adult with them who is not the patient.  Inpatient Visitation:    Visiting hours are 7 a.m. to 8 p.m. Up to four visitors are allowed at one time in a patient room. The visitors may rotate out with other people during the day.  One visitor age 17 or older may stay with the patient overnight and must be in the room by 8 p.m.    Pre-operative 5 CHG Bath Instructions   You can play a key role in reducing the risk of infection after surgery. Your skin needs to be as free of germs as possible. You can reduce the number of germs on your skin by washing with CHG (chlorhexidine gluconate) soap before surgery. CHG is an antiseptic soap that kills germs and continues to kill germs even after washing.   DO NOT use if you have an allergy to chlorhexidine/CHG or antibacterial soaps. If your skin becomes reddened or irritated, stop using the CHG and notify one of our RNs at [phone].   Please shower with the CHG soap starting 4 days before surgery using the following schedule:     Please keep in mind the following:  DO NOT shave, including legs and underarms, starting the day of your first shower.    You may shave your face at any point before/day of surgery.  Place clean sheets on your bed the day you start using CHG soap. Use a clean washcloth (not used since being washed) for each shower. DO NOT sleep with pets once you start using the CHG.   CHG Shower Instructions:  If you choose to wash your hair and private area, wash first with your normal shampoo/soap.  After you use shampoo/soap, rinse your hair and body thoroughly to remove shampoo/soap residue.  Turn the water OFF and apply about 3 tablespoons (45 ml) of CHG soap to a CLEAN washcloth.  Apply CHG soap ONLY FROM YOUR NECK DOWN TO YOUR TOES (washing for 3-5 minutes)  DO NOT use CHG soap on face, private areas, open wounds, or sores.  Pay special attention to the area where your surgery is being performed.  If you are having back surgery, having someone wash your back for you may be helpful. Wait 2 minutes after CHG soap is applied,  then you may rinse off the CHG soap.  Pat dry with a clean towel  Put on clean clothes/pajamas   If you choose to wear lotion, please use ONLY the CHG-compatible lotions on the back of this paper.     Additional instructions for the day of surgery: DO NOT APPLY any lotions, deodorants, cologne, or perfumes.   Put on clean/comfortable clothes.  Brush your teeth.  Ask your nurse before applying any prescription medications to the skin.      CHG Compatible Lotions   Aveeno Moisturizing lotion  Cetaphil Moisturizing Cream  Cetaphil Moisturizing Lotion  Clairol Herbal Essence Moisturizing Lotion, Dry Skin  Clairol Herbal Essence Moisturizing Lotion, Extra Dry Skin  Clairol Herbal Essence Moisturizing Lotion, Normal Skin  Curel Age Defying Therapeutic Moisturizing Lotion with Alpha Hydroxy  Curel Extreme Care Body Lotion  Curel Soothing Hands Moisturizing Hand Lotion  Curel Therapeutic Moisturizing Cream, Fragrance-Free  Curel Therapeutic Moisturizing Lotion, Fragrance-Free  Curel  Therapeutic Moisturizing Lotion, Original Formula  Eucerin Daily Replenishing Lotion  Eucerin Dry Skin Therapy Plus Alpha Hydroxy Crme  Eucerin Dry Skin Therapy Plus Alpha Hydroxy Lotion  Eucerin Original Crme  Eucerin Original Lotion  Eucerin Plus Crme Eucerin Plus Lotion  Eucerin TriLipid Replenishing Lotion  Keri Anti-Bacterial Hand Lotion  Keri Deep Conditioning Original Lotion Dry Skin Formula Softly Scented  Keri Deep Conditioning Original Lotion, Fragrance Free Sensitive Skin Formula  Keri Lotion Fast Absorbing Fragrance Free Sensitive Skin Formula  Keri Lotion Fast Absorbing Softly Scented Dry Skin Formula  Keri Original Lotion  Keri Skin Renewal Lotion Keri Silky Smooth Lotion  Keri Silky Smooth Sensitive Skin Lotion  Nivea Body Creamy Conditioning Oil  Nivea Body Extra Enriched Lotion  Nivea Body Original Lotion  Nivea Body Sheer Moisturizing Lotion Nivea Crme  Nivea Skin Firming Lotion  NutraDerm 30 Skin Lotion  NutraDerm Skin Lotion  NutraDerm Therapeutic Skin Cream  NutraDerm Therapeutic Skin Lotion  ProShield Protective Hand Cream  Provon moisturizing lotion  How to Use an Incentive Spirometer An incentive spirometer is a tool that measures how well you are filling your lungs with each breath. Learning to take long, deep breaths using this tool can help you keep your lungs clear and active. This may help to reverse or lessen your chance of developing breathing (pulmonary) problems, especially infection. You may be asked to use a spirometer: After a surgery. If you have a lung problem or a history of smoking. After a long period of time when you have been unable to move or be active. If the spirometer includes an indicator to show the highest number that you have reached, your health care provider or respiratory therapist will help you set a goal. Keep a log of your progress as told by your health care provider. What are the risks? Breathing too quickly may cause  dizziness or cause you to pass out. Take your time so you do not get dizzy or light-headed. If you are in pain, you may need to take pain medicine before doing incentive spirometry. It is harder to take a deep breath if you are having pain. How to use your incentive spirometer  Sit up on the edge of your bed or on a chair. Hold the incentive spirometer so that it is in an upright position. Before you use the spirometer, breathe out normally. Place the mouthpiece in your mouth. Make sure your lips are closed tightly around it. Breathe in slowly and as deeply as you can through your mouth,  causing the piston or the ball to rise toward the top of the chamber. Hold your breath for 3-5 seconds, or for as long as possible. If the spirometer includes a coach indicator, use this to guide you in breathing. Slow down your breathing if the indicator goes above the marked areas. Remove the mouthpiece from your mouth and breathe out normally. The piston or ball will return to the bottom of the chamber. Rest for a few seconds, then repeat the steps 10 or more times. Take your time and take a few normal breaths between deep breaths so that you do not get dizzy or light-headed. Do this every 1-2 hours when you are awake. If the spirometer includes a goal marker to show the highest number you have reached (best effort), use this as a goal to work toward during each repetition. After each set of 10 deep breaths, cough a few times. This will help to make sure that your lungs are clear. If you have an incision on your chest or abdomen from surgery, place a pillow or a rolled-up towel firmly against the incision when you cough. This can help to reduce pain while taking deep breaths and coughing. General tips When you are able to get out of bed: Walk around often. Continue to take deep breaths and cough in order to clear your lungs. Keep using the incentive spirometer until your health care provider says it is okay  to stop using it. If you have been in the hospital, you may be told to keep using the spirometer at home. Contact a health care provider if: You are having difficulty using the spirometer. You have trouble using the spirometer as often as instructed. Your pain medicine is not giving enough relief for you to use the spirometer as told. You have a fever. Get help right away if: You develop shortness of breath. You develop a cough with bloody mucus from the lungs. You have fluid or blood coming from an incision site after you cough. Summary An incentive spirometer is a tool that can help you learn to take long, deep breaths to keep your lungs clear and active. You may be asked to use a spirometer after a surgery, if you have a lung problem or a history of smoking, or if you have been inactive for a long period of time. Use your incentive spirometer as instructed every 1-2 hours while you are awake. If you have an incision on your chest or abdomen, place a pillow or a rolled-up towel firmly against your incision when you cough. This will help to reduce pain. Get help right away if you have shortness of breath, you cough up bloody mucus, or blood comes from your incision when you cough. This information is not intended to replace advice given to you by your health care provider. Make sure you discuss any questions you have with your health care provider. Document Revised: 08/24/2019 Document Reviewed: 08/24/2019 Elsevier Patient Education  2024 [employer].

## 2023-05-11 NOTE — H&P (Signed)
ORTHOPAEDIC HISTORY & PHYSICAL Latanya Maudlin, PA - 05/06/2023 2:00 PM EST Formatting of this note is different from the original. Images from the original note were not included. Chief Complaint Chief Complaint Patient presents with Knee Pain H & P LEFT KNEE  Reason for Visit Jonathan Greer is a 77 y.o. who presents today for history and physical. He is to undergo a left total knee arthroplasty on 05/15/2023. Since his last visit at the clinic there have been no improvement in his condition. The patient expresses his desire to proceed with surgery. 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  He reports a several year history of left knee pain that has significantly increased in severity over the last several months. He was apparently evaluated at [facility] in April and received an intraarticular corticosteroid injection that provided only temporary benefit. He localizes most of the pain along the medial aspect of the knee. He reports some swelling, no locking, and some giving way of the knee. The pain is aggravated by any weight bearing. The knee pain limits the patient's ability to ambulate long distances, although he has tried to continue mowing yards. The patient has not appreciated any significant improvement despite NSAIDs, activity modification, knee sleeve, and intraarticular corticosteroid injections. He is not using any ambulatory aids. The patient states that the knee pain has progressed to the point that it is significantly interfering with his activities of daily living.  Past Medical History Past Medical History: Diagnosis Date Chicken pox Hepatic steatosis 03/22/2019 Formatting of this note might be different from the original. Per CT 2019 Hyperlipemia 07/07/2015 Hyperlipidemia Prediabetes 08/26/2019  Past Surgical History Past Surgical History: Procedure Laterality Date HERNIA REPAIR WISDOM TEETH  Past Family History Family History Problem  Relation Age of Onset Heart disease Mother Diabetes type II Father Hyperlipidemia (Elevated cholesterol) Father High blood pressure (Hypertension) Father No Known Problems Sister Lung cancer Brother  Medications Current Outpatient Medications Medication Sig Dispense Refill ezetimibe (ZETIA) 10 mg tablet Take 10 mg by mouth every Monday, Wednesday, and Friday ibuprofen (ADVIL,MOTRIN) 200 MG tablet Take 400 mg by mouth every 6 (six) hours as needed for Pain  No current facility-administered medications for this visit.  Allergies Allergies Allergen Reactions Atorvastatin Muscle Pain Hip and knee pain. Tolerates in low doses   Review of Systems A comprehensive 14 point ROS was performed, reviewed, and the pertinent orthopaedic findings are documented in the HPI.  Exam BP (!) 140/80 (BP Location: Left upper arm, Patient Position: Sitting, BP Cuff Size: Large Adult)  Ht 177.8 cm (5\' 10" )  Wt (!) 113.9 kg (251 lb 3.2 oz)  BMI 36.04 kg/m  General: Well-developed well-nourished male seen in no acute distress.  HEENT: Atraumatic,normocephalic. Pupils are equal and reactive to light. Oropharynx is clear with moist mucosa  Lungs: Clear to auscultation bilaterally  Cardiovascular: Regular rate and rhythm. Normal S1, S2. No murmurs. No appreciable gallops or rubs. Peripheral pulses are palpable.  Abdomen: Soft, non-tender, nondistended. Bowel sounds present  Extremity: Left Knee: Soft tissue swelling: minimal Effusion: none Erythema: none Crepitance: mild Tenderness: medial Alignment: relative varus Mediolateral laxity: medial pseudolaxity Posterior sag: negative Patellar tracking: Good tracking without evidence of subluxation or tilt Atrophy: No significant atrophy. Quadriceps tone was good. Range of motion: 0/0/128 degrees  Neurological:  The patient is alert and oriented Sensation to light touch appears to be intact and within normal limits Gross motor strength  appeared to be equal to 5/5  Vascular :  Peripheral pulses felt to be palpable. Capillary refill appears to be intact and within normal limits  X-ray  1. AP standing, lateral and sunrise view of the left knee ordered and interpreted on today's visit shows a significant narrowing to the medial cartilage space with increased varus alignment being noted. Is noted to have osteophytes as well as subchondral sclerosis noted. Patella appears to be tracking well. No acute bony abnormalities noted.  Impression  1. Degenerative arthrosis left knee  Plan  1. I have gone over the patient's medication list on today's visit 2. Past medical history has been reviewed 3. Postop rehab course discussed 4. Return to clinic 2 weeks postop. Sooner if any problems  This note was generated in part with voice recognition software and I apologize for any typographical errors that were not detected and corrected   Tera Partridge PA Electronically signed by Latanya Maudlin, PA at 05/06/2023 4:07 PM EST

## 2023-05-15 ENCOUNTER — Encounter: Payer: Self-pay | Admitting: Orthopedic Surgery

## 2023-05-15 ENCOUNTER — Observation Stay: Payer: Medicare Other

## 2023-05-15 ENCOUNTER — Other Ambulatory Visit: Payer: Self-pay

## 2023-05-15 ENCOUNTER — Ambulatory Visit: Payer: Medicare Other

## 2023-05-15 ENCOUNTER — Encounter
Admission: RE | Disposition: A | Discharge status: Home Health | Payer: Self-pay | Source: Home / Self Care | Attending: Orthopedic Surgery

## 2023-05-15 ENCOUNTER — Ambulatory Visit: Payer: Medicare Other | Admitting: Urgent Care

## 2023-05-15 ENCOUNTER — Observation Stay
Admission: RE | Admit: 2023-05-15 | Discharge: 2023-05-16 | Disposition: A | Discharge status: Home Health | Payer: Medicare Other | Attending: Orthopedic Surgery | Admitting: Orthopedic Surgery

## 2023-05-15 DIAGNOSIS — M1712 Unilateral primary osteoarthritis, left knee: Secondary | ICD-10-CM | POA: Diagnosis not present

## 2023-05-15 DIAGNOSIS — R7309 Other abnormal glucose: Secondary | ICD-10-CM | POA: Insufficient documentation

## 2023-05-15 DIAGNOSIS — Z96652 Presence of left artificial knee joint: Secondary | ICD-10-CM | POA: Diagnosis not present

## 2023-05-15 DIAGNOSIS — R7303 Prediabetes: Secondary | ICD-10-CM

## 2023-05-15 HISTORY — PX: KNEE ARTHROPLASTY: SHX992

## 2023-05-15 LAB — GLUCOSE, CAPILLARY
Glucose-Capillary: 144 mg/dL — ABNORMAL HIGH (ref 70–99)
Glucose-Capillary: 198 mg/dL — ABNORMAL HIGH (ref 70–99)
Glucose-Capillary: 239 mg/dL — ABNORMAL HIGH (ref 70–99)

## 2023-05-15 SURGERY — ARTHROPLASTY, KNEE, TOTAL, USING IMAGELESS COMPUTER-ASSISTED NAVIGATION
Anesthesia: Spinal | Site: Knee | Laterality: Left

## 2023-05-15 MED ORDER — GABAPENTIN 300 MG PO CAPS
ORAL_CAPSULE | ORAL | Status: AC
  Filled 2023-05-15: qty 1

## 2023-05-15 MED ORDER — TRANEXAMIC ACID-NACL 1000-0.7 MG/100ML-% IV SOLN
INTRAVENOUS | Status: AC
  Filled 2023-05-15: qty 100

## 2023-05-15 MED ORDER — METOCLOPRAMIDE HCL 5 MG PO TABS
10.0000 mg | ORAL_TABLET | Freq: Three times a day (TID) | ORAL | Status: DC
  Administered 2023-05-15 – 2023-05-16 (×3): 10 mg via ORAL
  Filled 2023-05-15 (×3): qty 2

## 2023-05-15 MED ORDER — FERROUS SULFATE 325 (65 FE) MG PO TABS
325.0000 mg | ORAL_TABLET | Freq: Two times a day (BID) | ORAL | Status: DC
  Administered 2023-05-15 – 2023-05-16 (×2): 325 mg via ORAL
  Filled 2023-05-15 (×2): qty 1

## 2023-05-15 MED ORDER — ENSURE PRE-SURGERY PO LIQD
296.0000 mL | Freq: Once | ORAL | Status: AC
  Administered 2023-05-15: 296 mL via ORAL
  Filled 2023-05-15: qty 296

## 2023-05-15 MED ORDER — ACETAMINOPHEN 10 MG/ML IV SOLN
INTRAVENOUS | Status: DC | PRN
  Administered 2023-05-15: 1000 mg via INTRAVENOUS

## 2023-05-15 MED ORDER — OXYCODONE HCL 5 MG PO TABS
5.0000 mg | ORAL_TABLET | ORAL | Status: DC | PRN
  Administered 2023-05-15 – 2023-05-16 (×3): 5 mg via ORAL
  Filled 2023-05-15 (×3): qty 1

## 2023-05-15 MED ORDER — FENTANYL CITRATE (PF) 100 MCG/2ML IJ SOLN
25.0000 ug | INTRAMUSCULAR | Status: DC | PRN
Start: 2023-05-15 — End: 2023-05-15

## 2023-05-15 MED ORDER — GABAPENTIN 300 MG PO CAPS
300.0000 mg | ORAL_CAPSULE | Freq: Once | ORAL | Status: AC
  Administered 2023-05-15: 300 mg via ORAL

## 2023-05-15 MED ORDER — SODIUM CHLORIDE (PF) 0.9 % IJ SOLN
INTRAMUSCULAR | Status: DC | PRN
  Administered 2023-05-15: 120 mL via INTRAMUSCULAR

## 2023-05-15 MED ORDER — PHENOL 1.4 % MT LIQD: 1.0000 | OROMUCOSAL | Status: DC | PRN

## 2023-05-15 MED ORDER — SENNOSIDES-DOCUSATE SODIUM 8.6-50 MG PO TABS
1.0000 | ORAL_TABLET | Freq: Two times a day (BID) | ORAL | Status: DC
  Administered 2023-05-15 – 2023-05-16 (×2): 1 via ORAL
  Filled 2023-05-15 (×2): qty 1

## 2023-05-15 MED ORDER — ONDANSETRON HCL 4 MG/2ML IJ SOLN: 4.0000 mg | Freq: Four times a day (QID) | INTRAMUSCULAR | Status: DC | PRN

## 2023-05-15 MED ORDER — PROPOFOL 1000 MG/100ML IV EMUL
INTRAVENOUS | Status: AC
  Filled 2023-05-15: qty 100

## 2023-05-15 MED ORDER — ACETAMINOPHEN 10 MG/ML IV SOLN
INTRAVENOUS | Status: AC
  Filled 2023-05-15: qty 100

## 2023-05-15 MED ORDER — ORAL CARE MOUTH RINSE: 15.0000 mL | Freq: Once | OROMUCOSAL | Status: AC

## 2023-05-15 MED ORDER — ACETAMINOPHEN 325 MG PO TABS: 325.0000 mg | ORAL_TABLET | Freq: Four times a day (QID) | ORAL | Status: DC | PRN

## 2023-05-15 MED ORDER — MIDAZOLAM HCL 2 MG/2ML IJ SOLN
INTRAMUSCULAR | Status: AC
Start: 2023-05-15 — End: ?
  Filled 2023-05-15: qty 2

## 2023-05-15 MED ORDER — CHLORHEXIDINE GLUCONATE 0.12 % MT SOLN
15.0000 mL | Freq: Once | OROMUCOSAL | Status: AC
Start: 2023-05-15 — End: 2023-05-15
  Administered 2023-05-15: 15 mL via OROMUCOSAL

## 2023-05-15 MED ORDER — CEFAZOLIN SODIUM-DEXTROSE 2-4 GM/100ML-% IV SOLN
2.0000 g | Freq: Four times a day (QID) | INTRAVENOUS | Status: AC
  Administered 2023-05-15 (×2): 2 g via INTRAVENOUS
  Filled 2023-05-15 (×2): qty 100

## 2023-05-15 MED ORDER — DEXAMETHASONE SODIUM PHOSPHATE 10 MG/ML IJ SOLN
INTRAMUSCULAR | Status: AC
  Filled 2023-05-15: qty 1

## 2023-05-15 MED ORDER — CEFAZOLIN SODIUM-DEXTROSE 2-4 GM/100ML-% IV SOLN
INTRAVENOUS | Status: AC
  Filled 2023-05-15: qty 100

## 2023-05-15 MED ORDER — MENTHOL 3 MG MT LOZG: 1.0000 | LOZENGE | OROMUCOSAL | Status: DC | PRN

## 2023-05-15 MED ORDER — LACTATED RINGERS IV SOLN: INTRAVENOUS | Status: DC

## 2023-05-15 MED ORDER — BUPIVACAINE HCL (PF) 0.5 % IJ SOLN
INTRAMUSCULAR | Status: DC | PRN
  Administered 2023-05-15: 3 mL

## 2023-05-15 MED ORDER — FENTANYL CITRATE (PF) 100 MCG/2ML IJ SOLN
INTRAMUSCULAR | Status: AC
  Filled 2023-05-15: qty 2

## 2023-05-15 MED ORDER — INSULIN ASPART 100 UNIT/ML IJ SOLN
0.0000 [IU] | Freq: Three times a day (TID) | INTRAMUSCULAR | Status: DC
  Administered 2023-05-15: 5 [IU] via SUBCUTANEOUS
  Filled 2023-05-15 (×2): qty 1

## 2023-05-15 MED ORDER — CELECOXIB 200 MG PO CAPS
400.0000 mg | ORAL_CAPSULE | Freq: Once | ORAL | Status: AC
  Administered 2023-05-15: 400 mg via ORAL

## 2023-05-15 MED ORDER — CHLORHEXIDINE GLUCONATE 0.12 % MT SOLN
OROMUCOSAL | Status: AC
Start: 2023-05-15 — End: ?
  Filled 2023-05-15: qty 15

## 2023-05-15 MED ORDER — ASPIRIN 81 MG PO CHEW
81.0000 mg | CHEWABLE_TABLET | Freq: Two times a day (BID) | ORAL | Status: DC
  Administered 2023-05-15 – 2023-05-16 (×2): 81 mg via ORAL
  Filled 2023-05-15 (×2): qty 1

## 2023-05-15 MED ORDER — ATORVASTATIN CALCIUM 20 MG PO TABS
20.0000 mg | ORAL_TABLET | ORAL | Status: DC
  Administered 2023-05-15: 20 mg via ORAL
  Filled 2023-05-15 (×2): qty 1

## 2023-05-15 MED ORDER — INSULIN ASPART 100 UNIT/ML IJ SOLN
0.0000 [IU] | Freq: Every day | INTRAMUSCULAR | Status: DC
  Administered 2023-05-15: 5 [IU] via SUBCUTANEOUS
  Filled 2023-05-15: qty 1

## 2023-05-15 MED ORDER — TRANEXAMIC ACID-NACL 1000-0.7 MG/100ML-% IV SOLN
1000.0000 mg | Freq: Once | INTRAVENOUS | Status: AC
  Administered 2023-05-15: 1000 mg via INTRAVENOUS

## 2023-05-15 MED ORDER — DIPHENHYDRAMINE HCL 12.5 MG/5ML PO ELIX: 12.5000 mg | ORAL_SOLUTION | ORAL | Status: DC | PRN

## 2023-05-15 MED ORDER — SODIUM CHLORIDE 0.9 % IV SOLN: INTRAVENOUS | Status: DC

## 2023-05-15 MED ORDER — OXYCODONE HCL 5 MG PO TABS: 10.0000 mg | ORAL_TABLET | ORAL | Status: DC | PRN

## 2023-05-15 MED ORDER — CELECOXIB 200 MG PO CAPS
ORAL_CAPSULE | ORAL | Status: AC
  Filled 2023-05-15: qty 2

## 2023-05-15 MED ORDER — BUPIVACAINE HCL (PF) 0.25 % IJ SOLN
INTRAMUSCULAR | Status: AC
  Filled 2023-05-15: qty 120

## 2023-05-15 MED ORDER — SODIUM CHLORIDE FLUSH 0.9 % IV SOLN
INTRAVENOUS | Status: AC
  Filled 2023-05-15: qty 80

## 2023-05-15 MED ORDER — DEXAMETHASONE SODIUM PHOSPHATE 10 MG/ML IJ SOLN: INTRAMUSCULAR | Status: DC | PRN

## 2023-05-15 MED ORDER — PHENYLEPHRINE HCL-NACL 20-0.9 MG/250ML-% IV SOLN
INTRAVENOUS | Status: AC
  Filled 2023-05-15: qty 250

## 2023-05-15 MED ORDER — BUPIVACAINE LIPOSOME 1.3 % IJ SUSP
INTRAMUSCULAR | Status: AC
  Filled 2023-05-15: qty 40

## 2023-05-15 MED ORDER — BISACODYL 10 MG RE SUPP: 10.0000 mg | Freq: Every day | RECTAL | Status: DC | PRN

## 2023-05-15 MED ORDER — ACETAMINOPHEN 10 MG/ML IV SOLN
1000.0000 mg | Freq: Four times a day (QID) | INTRAVENOUS | Status: DC
  Administered 2023-05-16 (×2): 1000 mg via INTRAVENOUS
  Filled 2023-05-15: qty 100

## 2023-05-15 MED ORDER — ACETAMINOPHEN 10 MG/ML IV SOLN
1000.0000 mg | Freq: Four times a day (QID) | INTRAVENOUS | Status: DC
  Administered 2023-05-15: 1000 mg via INTRAVENOUS
  Filled 2023-05-15 (×2): qty 100

## 2023-05-15 MED ORDER — CEFAZOLIN SODIUM-DEXTROSE 2-4 GM/100ML-% IV SOLN
2.0000 g | INTRAVENOUS | Status: AC
  Administered 2023-05-15: 2 g via INTRAVENOUS

## 2023-05-15 MED ORDER — HYDROMORPHONE HCL 1 MG/ML IJ SOLN: 0.5000 mg | INTRAMUSCULAR | Status: DC | PRN

## 2023-05-15 MED ORDER — MIDAZOLAM HCL 5 MG/5ML IJ SOLN
INTRAMUSCULAR | Status: DC | PRN
  Administered 2023-05-15 (×2): 1 mg via INTRAVENOUS

## 2023-05-15 MED ORDER — TRAMADOL HCL 50 MG PO TABS: 50.0000 mg | ORAL_TABLET | ORAL | Status: DC | PRN

## 2023-05-15 MED ORDER — SODIUM CHLORIDE 0.9 % IR SOLN
Status: DC | PRN
  Administered 2023-05-15: 3000 mL

## 2023-05-15 MED ORDER — SURGIPHOR WOUND IRRIGATION SYSTEM - OPTIME: TOPICAL | Status: DC | PRN

## 2023-05-15 MED ORDER — BUPIVACAINE HCL (PF) 0.5 % IJ SOLN
INTRAMUSCULAR | Status: AC
  Filled 2023-05-15: qty 10

## 2023-05-15 MED ORDER — FLEET ENEMA RE ENEM: 1.0000 | ENEMA | Freq: Once | RECTAL | Status: DC | PRN

## 2023-05-15 MED ORDER — PANTOPRAZOLE SODIUM 40 MG PO TBEC
40.0000 mg | DELAYED_RELEASE_TABLET | Freq: Two times a day (BID) | ORAL | Status: DC
  Administered 2023-05-15 – 2023-05-16 (×2): 40 mg via ORAL
  Filled 2023-05-15 (×2): qty 1

## 2023-05-15 MED ORDER — DEXAMETHASONE SODIUM PHOSPHATE 10 MG/ML IJ SOLN
8.0000 mg | Freq: Once | INTRAMUSCULAR | Status: AC
  Administered 2023-05-15: 8 mg via INTRAVENOUS

## 2023-05-15 MED ORDER — PHENYLEPHRINE HCL-NACL 20-0.9 MG/250ML-% IV SOLN
INTRAVENOUS | Status: DC | PRN
  Administered 2023-05-15: 15 ug/min via INTRAVENOUS

## 2023-05-15 MED ORDER — TRANEXAMIC ACID-NACL 1000-0.7 MG/100ML-% IV SOLN
1000.0000 mg | INTRAVENOUS | Status: AC
  Administered 2023-05-15: 1000 mg via INTRAVENOUS

## 2023-05-15 MED ORDER — PROPOFOL 500 MG/50ML IV EMUL
INTRAVENOUS | Status: DC | PRN
  Administered 2023-05-15: 100 ug/kg/min via INTRAVENOUS

## 2023-05-15 MED ORDER — ONDANSETRON HCL 4 MG PO TABS: 4.0000 mg | ORAL_TABLET | Freq: Four times a day (QID) | ORAL | Status: DC | PRN

## 2023-05-15 MED ORDER — CELECOXIB 200 MG PO CAPS
200.0000 mg | ORAL_CAPSULE | Freq: Two times a day (BID) | ORAL | Status: DC
  Administered 2023-05-15 – 2023-05-16 (×2): 200 mg via ORAL
  Filled 2023-05-15 (×2): qty 1

## 2023-05-15 MED ORDER — CHLORHEXIDINE GLUCONATE 4 % EX SOLN
60.0000 mL | Freq: Once | CUTANEOUS | Status: AC
  Administered 2023-05-15: 4 via TOPICAL

## 2023-05-15 MED ORDER — ALUM & MAG HYDROXIDE-SIMETH 200-200-20 MG/5ML PO SUSP: 30.0000 mL | ORAL | Status: DC | PRN

## 2023-05-15 MED ORDER — MAGNESIUM HYDROXIDE 400 MG/5ML PO SUSP
30.0000 mL | Freq: Every day | ORAL | Status: DC
  Administered 2023-05-15 – 2023-05-16 (×2): 30 mL via ORAL
  Filled 2023-05-15 (×2): qty 30

## 2023-05-15 SURGICAL SUPPLY — 66 items
ATTUNE MED DOME PAT 41 KNEE (Knees) IMPLANT
ATTUNE PS FEM LT SZ 6 CEM KNEE (Femur) IMPLANT
ATTUNE PSRP INSR SZ6 5 KNEE (Insert) IMPLANT
BASE TIBIAL ROT PLAT SZ 7 KNEE (Knees) IMPLANT
BATTERY INSTRU NAVIGATION (MISCELLANEOUS) ×4 IMPLANT
BIT DRILL QUICK REL 1/8 2PK SL (BIT) ×1 IMPLANT
BLADE CLIPPER SURG (BLADE) IMPLANT
BLADE SAW 70X12.5 (BLADE) ×1 IMPLANT
BLADE SAW 90X13X1.19 OSCILLAT (BLADE) ×1 IMPLANT
BLADE SAW 90X25X1.19 OSCILLAT (BLADE) ×1 IMPLANT
BONE CEMENT GENTAMICIN (Cement) ×2 IMPLANT
BRUSH SCRUB EZ PLAIN DRY (MISCELLANEOUS) ×1 IMPLANT
CEMENT BONE GENTAMICIN 40 (Cement) IMPLANT
COOLER POLAR GLACIER W/PUMP (MISCELLANEOUS) ×1 IMPLANT
CUFF TRNQT CYL 24X4X16.5-23 (TOURNIQUET CUFF) IMPLANT
CUFF TRNQT CYL 30X4X21-28X (TOURNIQUET CUFF) IMPLANT
DRAPE SHEET LG 3/4 BI-LAMINATE (DRAPES) ×1 IMPLANT
DRSG AQUACEL AG ADV 3.5X14 (GAUZE/BANDAGES/DRESSINGS) ×1 IMPLANT
DRSG MEPILEX SACRM 8.7X9.8 (GAUZE/BANDAGES/DRESSINGS) ×1 IMPLANT
DRSG TEGADERM 4X4.75 (GAUZE/BANDAGES/DRESSINGS) ×1 IMPLANT
DURAPREP 26ML APPLICATOR (WOUND CARE) ×2 IMPLANT
ELECT CAUTERY BLADE 6.4 (BLADE) ×1 IMPLANT
ELECT REM PT RETURN 9FT ADLT (ELECTROSURGICAL) ×1
ELECTRODE REM PT RTRN 9FT ADLT (ELECTROSURGICAL) ×1 IMPLANT
EVACUATOR 1/8 PVC DRAIN (DRAIN) ×1 IMPLANT
EX-PIN ORTHOLOCK NAV 4X150 (PIN) ×2 IMPLANT
GAUZE XEROFORM 1X8 LF (GAUZE/BANDAGES/DRESSINGS) ×1 IMPLANT
GLOVE BIOGEL M STRL SZ7.5 (GLOVE) ×6 IMPLANT
GLOVE SURG UNDER POLY LF SZ8 (GLOVE) ×2 IMPLANT
GOWN STRL REUS W/ TWL LRG LVL3 (GOWN DISPOSABLE) ×1 IMPLANT
GOWN STRL REUS W/ TWL XL LVL3 (GOWN DISPOSABLE) ×1 IMPLANT
GOWN TOGA ZIPPER T7+ PEEL AWAY (MISCELLANEOUS) ×1 IMPLANT
HOLDER FOLEY CATH W/STRAP (MISCELLANEOUS) ×1 IMPLANT
HOOD PEEL AWAY T7 (MISCELLANEOUS) ×1 IMPLANT
IV NS IRRIG 3000ML ARTHROMATIC (IV SOLUTION) ×1 IMPLANT
KIT TURNOVER KIT A (KITS) ×1 IMPLANT
KNIFE SCULPS 14X20 (INSTRUMENTS) ×1 IMPLANT
MANIFOLD NEPTUNE II (INSTRUMENTS) ×2 IMPLANT
NDL SPNL 20GX3.5 QUINCKE YW (NEEDLE) ×2 IMPLANT
NEEDLE SPNL 20GX3.5 QUINCKE YW (NEEDLE) ×2 IMPLANT
PACK TOTAL KNEE (MISCELLANEOUS) ×1 IMPLANT
PAD ABD DERMACEA PRESS 5X9 (GAUZE/BANDAGES/DRESSINGS) ×2 IMPLANT
PAD ARMBOARD 7.5X6 YLW CONV (MISCELLANEOUS) ×3 IMPLANT
PAD WRAPON POLAR KNEE (MISCELLANEOUS) ×1 IMPLANT
PENCIL SMOKE EVACUATOR COATED (MISCELLANEOUS) ×1 IMPLANT
PIN DRILL FIX HALF THREAD (BIT) ×2 IMPLANT
PIN FIXATION 1/8DIA X 3INL (PIN) ×1 IMPLANT
PULSAVAC PLUS IRRIG FAN TIP (DISPOSABLE) ×1
SOLUTION IRRIG SURGIPHOR (IV SOLUTION) ×1 IMPLANT
SPONGE DRAIN TRACH 4X4 STRL 2S (GAUZE/BANDAGES/DRESSINGS) ×1 IMPLANT
STAPLER SKIN PROX 35W (STAPLE) ×1 IMPLANT
STOCKINETTE STRL BIAS CUT 8X4 (MISCELLANEOUS) ×1 IMPLANT
STRAP TIBIA SHORT (MISCELLANEOUS) ×1 IMPLANT
SUCTION TUBE FRAZIER 10FR DISP (SUCTIONS) ×1 IMPLANT
SUT VIC AB 0 CT1 36 (SUTURE) ×1 IMPLANT
SUT VIC AB 1 CT1 36 (SUTURE) ×2 IMPLANT
SUT VIC AB 2-0 CT2 27 (SUTURE) ×1 IMPLANT
SYR 30ML LL (SYRINGE) ×2 IMPLANT
TIBIAL BASE ROT PLAT SZ 7 KNEE (Knees) ×1 IMPLANT
TIP FAN IRRIG PULSAVAC PLUS (DISPOSABLE) ×1 IMPLANT
TOWEL OR 17X26 4PK STRL BLUE (TOWEL DISPOSABLE) ×1 IMPLANT
TOWER CARTRIDGE SMART MIX (DISPOSABLE) ×1 IMPLANT
TRAP FLUID SMOKE EVACUATOR (MISCELLANEOUS) ×1 IMPLANT
TRAY FOLEY MTR SLVR 16FR STAT (SET/KITS/TRAYS/PACK) ×1 IMPLANT
WATER STERILE IRR 1000ML POUR (IV SOLUTION) ×1 IMPLANT
WRAPON POLAR PAD KNEE (MISCELLANEOUS) ×1

## 2023-05-15 NOTE — Anesthesia Procedure Notes (Signed)
Spinal  Patient location during procedure: OR Start time: 05/15/2023 7:24 AM End time: 05/15/2023 7:30 AM Reason for block: surgical anesthesia Staffing Performed: resident/CRNA  Anesthesiologist: Lenard Simmer, MD Resident/CRNA: Elisabeth Pigeon, CRNA Performed by: Elisabeth Pigeon, CRNA Authorized by: Lenard Simmer, MD   Preanesthetic Checklist Completed: patient identified, IV checked, site marked, risks and benefits discussed, surgical consent, monitors and equipment checked, pre-op evaluation and timeout performed Spinal Block Patient position: sitting Prep: DuraPrep Patient monitoring: heart rate, cardiac monitor, continuous pulse ox and blood pressure Approach: midline Location: L3-4 Injection technique: single-shot Needle Needle type: Pencan  Needle gauge: 24 G Needle length: 9 cm Assessment Sensory level: T4 Events: CSF return

## 2023-05-15 NOTE — Anesthesia Procedure Notes (Signed)
Procedure Name: MAC Date/Time: 05/15/2023 7:30 AM  Performed by: Elisabeth Pigeon, CRNAPre-anesthesia Checklist: Patient identified, Emergency Drugs available, Suction available, Patient being monitored and Timeout performed Patient Re-evaluated:Patient Re-evaluated prior to induction Oxygen Delivery Method: Simple face mask

## 2023-05-15 NOTE — Progress Notes (Signed)
Patient awake/alert x4. Able to move bil lower ext side to side, sensation intact remains weak but intact. Pulses +2. Family updated.  Patient verbalizes understanding of procedure, admission procedure.

## 2023-05-15 NOTE — Progress Notes (Incomplete)
Subjective: Day of Surgery Procedure(s) (LRB): COMPUTER ASSISTED TOTAL KNEE ARTHROPLASTY (Left) Patient reports pain as mild.   Patient seen in rounds with Dr. Ernest Pine. Patient is well, and has had no acute complaints or problems. Denies any CP, SOB, N/V, fevers or chills We will continue with therapy today.  Plan is to go Home after hospital stay.  Objective: Vital signs in last 24 hours: Temp:  [97.9 F (36.6 C)] 97.9 F (36.6 C) (11/27 0625) Pulse Rate:  [60] 60 (11/27 0625) Resp:  [16] 16 (11/27 0625) BP: (139)/(80) 139/80 (11/27 0625) SpO2:  [98 %] 98 % (11/27 0625) Weight:  [113.9 kg] 113.9 kg (11/27 0625)  Intake/Output from previous day:  Intake/Output Summary (Last 24 hours) at 05/15/2023 1117 Last data filed at 05/15/2023 1102 Gross per 24 hour  Intake 1300 ml  Output 750 ml  Net 550 ml    Intake/Output this shift: Total I/O In: 1300 [I.V.:1000; IV Piggyback:300] Out: 750 [Urine:700; Blood:50]  Labs: No results for input(s): "HGB" in the last 72 hours. No results for input(s): "WBC", "RBC", "HCT", "PLT" in the last 72 hours. No results for input(s): "NA", "K", "CL", "CO2", "BUN", "CREATININE", "GLUCOSE", "CALCIUM" in the last 72 hours. No results for input(s): "LABPT", "INR" in the last 72 hours.  EXAM General - Patient is Alert, Appropriate, and Oriented Extremity - Neurologically intact ABD soft Neurovascular intact Sensation intact distally Intact pulses distally Dorsiflexion/Plantar flexion intact No cellulitis present Compartment soft Dressing - dressing C/D/I and no drainage Motor Function - intact, moving foot and toes well on exam. Able to plantar and dorsi flex with good strength and ROM.  Neurovascularly intact all dermatomes down his left lower extremity.  Posterior tibial pulses appreciated JP Drain pulled without difficulty. Intact  Past Medical History:  Diagnosis Date   Arthritis of left knee    Basal cell carcinoma    nose   Blood  pressure elevated without history of HTN    Colon polyps    Complex renal cyst    Diverticulosis of large intestine without diverticulitis    Hemorrhoids    Hepatic steatosis    History of colonic polyps    History of kidney stones    Hyperlipemia 07/07/2015   Hyperlipidemia    Morbid obesity (HCC)    Pre-diabetes    Shingles 07/07/2015   Varicose vein of leg    Wears dentures     Assessment/Plan: Day of Surgery Procedure(s) (LRB): COMPUTER ASSISTED TOTAL KNEE ARTHROPLASTY (Left) Principal Problem:   History of total knee arthroplasty, left  Estimated body mass index is 36.03 kg/m as calculated from the following:   Height as of this encounter: 5\' 10"  (1.778 m).   Weight as of this encounter: 113.9 kg. Advance diet Up with therapy  Patient will continue to work with physical therapy to pass postoperative PT protocols, ROM and strengthening  Discussed with the patient continuing to utilize Polar Care  Patient will use bone foam in 20-30 minute intervals  Patient will wear TED hose bilaterally to help prevent DVT and clot formation  Discussed the Aquacel bandage.  This bandage will stay in place 7 days postoperatively.  Can be replaced with honeycomb bandages that will be sent home with the patient  Discussed sending the patient home with tramadol and oxycodone for as needed pain management.  Patient will also be sent home with Celebrex to help with swelling and inflammation.  Patient will take an 81 mg aspirin twice daily for DVT prophylaxis  JP drain removed without difficulty, intact  Weight-Bearing as tolerated to left leg  Patient will follow-up with [facility] clinic orthopedics in 2 weeks for staple removal and reevaluation  J. Horris Latino, PA-C [facility] 05/15/2023, 11:17 AM

## 2023-05-15 NOTE — TOC Initial Note (Signed)
Transition of Care [facility]) - Progression Note    Patient Details  Name: Jonathan Greer MRN: [id number] Date of Birth: 06/15/1946  Transition of Care [facility]) CM/SW Contact  Marlowe Sax, RN Phone Number: 05/15/2023, 3:49 PM  Clinical Narrative:     Patient is set up with [facility] prior to admission by the Surgeons office, [employer] will provide RW and 3 in 1 delivered to the bedside       Expected Discharge Plan and Services                                               Social Determinants of Health (SDOH) Interventions SDOH Screenings   Food Insecurity: No Food Insecurity (05/15/2023)  Housing: Low Risk  (05/15/2023)  Transportation Needs: No Transportation Needs (05/15/2023)  Utilities: Not At Risk (05/15/2023)  Alcohol Screen: Low Risk  (03/13/2023)  Depression (PHQ2-9): Low Risk  (03/13/2023)  Financial Resource Strain: Low Risk  (03/13/2023)  Physical Activity: Sufficiently Active (03/13/2023)  Social Connections: Moderately Integrated (03/13/2023)  Stress: No Stress Concern Present (03/13/2023)  Tobacco Use: Medium Risk (05/15/2023)  Health Literacy: Adequate Health Literacy (03/13/2023)    Readmission Risk Interventions     No data to display

## 2023-05-15 NOTE — Anesthesia Preprocedure Evaluation (Signed)
Anesthesia Evaluation  Patient identified by MRN, date of birth, ID band Patient awake    Reviewed: Allergy & Precautions, NPO status , Patient's Chart, lab work & pertinent test results  History of Anesthesia Complications Negative for: history of anesthetic complications  Airway Mallampati: III  TM Distance: >3 FB Neck ROM: full    Dental  (+) Upper Dentures, Lower Dentures, Dental Advidsory Given   Pulmonary neg pulmonary ROS, former smoker   Pulmonary exam normal        Cardiovascular Exercise Tolerance: Good negative cardio ROS Normal cardiovascular exam     Neuro/Psych negative neurological ROS  negative psych ROS   GI/Hepatic negative GI ROS, Neg liver ROS,,,  Endo/Other  negative endocrine ROS    Renal/GU Renal disease  negative genitourinary   Musculoskeletal   Abdominal   Peds  Hematology negative hematology ROS (+)   Anesthesia Other Findings Past Medical History: No date: Basal cell carcinoma     Comment:  nose No date: Colon polyps 07/07/2015: Hyperlipemia No date: Hyperlipidemia 07/07/2015: Shingles No date: Wears dentures  Past Surgical History: 03/13/2017: CATARACT EXTRACTION W/PHACO; Left     Comment:  Procedure: CATARACT EXTRACTION PHACO AND INTRAOCULAR               LENS PLACEMENT (IOC) LEFT;  Surgeon: Lockie Mola, MD;  Location: [facility];  Service:               Ophthalmology;  Laterality: Left;  IVA TOPICAL LEFT 04/17/2017: CATARACT EXTRACTION W/PHACO; Right     Comment:  Procedure: CATARACT EXTRACTION PHACO AND INTRAOCULAR               LENS PLACEMENT (IOC);  Surgeon: Lockie Mola, MD;              Location: [facility];  Service: Ophthalmology;                Laterality: Right;  IVA TOPICAL RIGHT 07/27/2016: COLONOSCOPY WITH PROPOFOL; N/A     Comment:  Procedure: COLONOSCOPY WITH PROPOFOL;  Surgeon: Wyline Mood, MD;   Location: [facility];  Service: Endoscopy;              Laterality: N/A; 05/02/2020: COLONOSCOPY WITH PROPOFOL; N/A     Comment:  Procedure: COLONOSCOPY WITH PROPOFOL;  Surgeon: Wyline Mood, MD;  Location: [facility];  Service:               Gastroenterology;  Laterality: N/A; No date: HERNIA REPAIR 01/10/2016: MOHS SURGERY     Comment:  [facility] for [facility] 1960's: TONSILLECTOMY AND ADENOIDECTOMY  BMI    Body Mass Index: 35.87 kg/m      Reproductive/Obstetrics negative OB ROS                             Anesthesia Physical Anesthesia Plan  ASA: 2  Anesthesia Plan: Spinal   Post-op Pain Management:    Induction: Intravenous  PONV Risk Score and Plan: Propofol infusion and TIVA  Airway Management Planned: Natural Airway and Nasal Cannula  Additional Equipment:   Intra-op Plan:   Post-operative Plan:   Informed Consent: I have reviewed the patients History and Physical, chart, labs and discussed  the procedure including the risks, benefits and alternatives for the proposed anesthesia with the patient or authorized representative who has indicated his/her understanding and acceptance.     Dental Advisory Given  Plan Discussed with: Anesthesiologist, CRNA and Surgeon  Anesthesia Plan Comments: (Patient consented for risks of anesthesia including but not limited to:  - adverse reactions to medications - risk of airway placement if required - damage to eyes, teeth, lips or other oral mucosa - nerve damage due to positioning  - sore throat or hoarseness - Damage to heart, brain, nerves, lungs, other parts of body or loss of life  Patient voiced understanding.)        Anesthesia Quick Evaluation

## 2023-05-15 NOTE — Interval H&P Note (Signed)
History and Physical Interval Note:  05/15/2023 6:13 AM  Jonathan Greer  has presented today for surgery, with the diagnosis of PRIMARY OSTEOARTHRITIS OF LEFT KNEE..  The various methods of treatment have been discussed with the patient and family. After consideration of risks, benefits and other options for treatment, the patient has consented to  Procedure(s): COMPUTER ASSISTED TOTAL KNEE ARTHROPLASTY (Left) as a surgical intervention.  The patient's history has been reviewed, patient examined, no change in status, stable for surgery.  I have reviewed the patient's chart and labs.  Questions were answered to the patient's satisfaction.     Kelwin Gibler P Bhavya Grand

## 2023-05-15 NOTE — Evaluation (Signed)
Physical Therapy Evaluation Patient Details Name: Jonathan Greer MRN: [id number] DOB: Sep 18, 1945 Today's Date: 05/15/2023  History of Present Illness  Pt is admitted for L TKR, currently POD 0 at time of evaluation.  Clinical Impression  Pt is a pleasant 77 year old male who was admitted for L TKR, POD 0. Pt demonstrates ability to perform 10 SLRs with independence, therefore does not require KI for mobility. Pt performs bed mobility, transfers, and ambulation with CGA and RW. Pt demonstrates deficits with ROM/strength/pain. Would benefit from skilled PT to address above deficits and promote optimal return to PLOF. Pt will continue to receive skilled PT services while admitted and will defer to TOC/care team for updates regarding disposition planning.       If plan is discharge home, recommend the following: A little help with walking and/or transfers;Help with stairs or ramp for entrance   Can travel by private vehicle        Equipment Recommendations Rolling walker (2 wheels);BSC/3in1  Recommendations for Other Services       Functional Status Assessment Patient has had a recent decline in their functional status and demonstrates the ability to make significant improvements in function in a reasonable and predictable amount of time.     Precautions / Restrictions Precautions Precautions: Knee;Fall Precaution Booklet Issued: No Restrictions Weight Bearing Restrictions: Yes LLE Weight Bearing: Weight bearing as tolerated      Mobility  Bed Mobility Overal bed mobility: Needs Assistance Bed Mobility: Supine to Sit     Supine to sit: Contact guard     General bed mobility comments: safe technique with upright posture once seated at EOB    Transfers Overall transfer level: Needs assistance Equipment used: Rolling walker (2 wheels) Transfers: Sit to/from Stand Sit to Stand: Contact guard assist           General transfer comment: safe technique with RW. Good  weight acceptance on surgical leg    Ambulation/Gait Ambulation/Gait assistance: Contact guard assist Gait Distance (Feet): 100 Feet Assistive device: Rolling walker (2 wheels) Gait Pattern/deviations: Step-through pattern       General Gait Details: ambulated around hallway with step to gait pattern. Slight unsteadiness and antalgic pattern  Stairs            Wheelchair Mobility     Tilt Bed    Modified Rankin (Stroke Patients Only)       Balance Overall balance assessment: Needs assistance Sitting-balance support: Feet supported Sitting balance-Leahy Scale: Good     Standing balance support: Bilateral upper extremity supported Standing balance-Leahy Scale: Good                               Pertinent Vitals/Pain Pain Assessment Pain Assessment: No/denies pain    Home Living Family/patient expects to be discharged to:: Private residence Living Arrangements: Spouse/significant other Available Help at Discharge: Family Type of Home: House Home Access: Stairs to enter Entrance Stairs-Rails: Right Entrance Stairs-Number of Steps: 2   Home Layout: One level Home Equipment: None      Prior Function Prior Level of Function : Independent/Modified Independent             Mobility Comments: active and mows yards       Extremity/Trunk Assessment   Upper Extremity Assessment Upper Extremity Assessment: Overall WFL for tasks assessed    Lower Extremity Assessment Lower Extremity Assessment: Generalized weakness (L LE grossly 4/5)  Communication   Communication Communication: No apparent difficulties  Cognition Arousal: Alert Behavior During Therapy: WFL for tasks assessed/performed Overall Cognitive Status: Within Functional Limits for tasks assessed                                 General Comments: pleasant and agreeable to session        General Comments      Exercises Total Joint  Exercises Goniometric ROM: L knee ROM 0-75 degrees Other Exercises Other Exercises: supine ther-ex performed including L QS, SLRs, hip abd/add x 10 reps and cga   Assessment/Plan    PT Assessment Patient needs continued PT services  PT Problem List Decreased strength;Decreased range of motion;Decreased balance;Decreased mobility;Pain       PT Treatment Interventions DME instruction;Gait training;Stair training;Therapeutic exercise    PT Goals (Current goals can be found in the Care Plan section)  Acute Rehab PT Goals Patient Stated Goal: to go home PT Goal Formulation: With patient Time For Goal Achievement: 05/29/23 Potential to Achieve Goals: Good    Frequency BID     Co-evaluation               AM-PAC PT "6 Clicks" Mobility  Outcome Measure Help needed turning from your back to your side while in a flat bed without using bedrails?: None Help needed moving from lying on your back to sitting on the side of a flat bed without using bedrails?: None Help needed moving to and from a bed to a chair (including a wheelchair)?: A Little Help needed standing up from a chair using your arms (e.g., wheelchair or bedside chair)?: A Little Help needed to walk in hospital room?: A Little Help needed climbing 3-5 steps with a railing? : A Lot 6 Click Score: 19    End of Session Equipment Utilized During Treatment: Gait belt Activity Tolerance: Patient tolerated treatment well Patient left: in chair;with chair alarm set Nurse Communication: Mobility status PT Visit Diagnosis: Muscle weakness (generalized) (M62.81);Difficulty in walking, not elsewhere classified (R26.2);Pain Pain - Right/Left: Left Pain - part of body: Knee    Time: 1420-1439 PT Time Calculation (min) (ACUTE ONLY): 19 min   Charges:   PT Evaluation $PT Eval Low Complexity: 1 Low PT Treatments $Gait Training: 8-22 mins PT General Charges $$ ACUTE PT VISIT: 1 Visit         Elizabeth Palau, PT, DPT,  GCS [phone]   [name] 05/15/2023, 3:18 PM

## 2023-05-15 NOTE — Op Note (Signed)
OPERATIVE NOTE  DATE OF SURGERY:  05/15/2023  PATIENT NAME:  Jonathan Greer   DOB: 07-06-1945  MRN: [id number]  PRE-OPERATIVE DIAGNOSIS: Degenerative arthrosis of the left knee, primary  POST-OPERATIVE DIAGNOSIS:  Same  PROCEDURE:  Left total knee arthroplasty using computer-assisted navigation  SURGEON:  Jena Gauss. M.D.  ASSISTANT:  Gean Birchwood, PA-C (present and scrubbed throughout the case, critical for assistance with exposure, retraction, instrumentation, and closure)  ANESTHESIA: spinal  ESTIMATED BLOOD LOSS: 50 mL  FLUIDS REPLACED: 600 mL of crystalloid  TOURNIQUET TIME: 85 minutes  DRAINS: 2 medium Hemovac drains  SOFT TISSUE RELEASES: Anterior cruciate ligament, posterior cruciate ligament, deep and superficial medial collateral ligament, patellofemoral ligament  IMPLANTS UTILIZED: DePuy Attune size 6 posterior stabilized femoral component (cemented), size 7 rotating platform tibial component (cemented), 41 mm medialized dome patella (cemented), and a 5 mm stabilized rotating platform polyethylene insert.  INDICATIONS FOR SURGERY: Jonathan Greer is a 77 y.o. year old adult with a long history of progressive knee pain. X-rays demonstrated severe degenerative changes in tricompartmental fashion. The patient had not seen any significant improvement despite conservative nonsurgical intervention. After discussion of the risks and benefits of surgical intervention, the patient expressed understanding of the risks benefits and agree with plans for total knee arthroplasty.   The risks, benefits, and alternatives were discussed at length including but not limited to the risks of infection, bleeding, nerve injury, stiffness, blood clots, the need for revision surgery, cardiopulmonary complications, among others, and they were willing to proceed.  PROCEDURE IN DETAIL: The patient was brought into the operating room and, after adequate spinal anesthesia was achieved,  a tourniquet was placed on the patient's upper thigh. The patient's knee and leg were cleaned and prepped with alcohol and DuraPrep and draped in the usual sterile fashion. A "timeout" was performed as per usual protocol. The lower extremity was exsanguinated using an Esmarch, and the tourniquet was inflated to 300 mmHg. An anterior longitudinal incision was made followed by a standard mid vastus approach. The deep fibers of the medial collateral ligament were elevated in a subperiosteal fashion off of the medial flare of the tibia so as to maintain a continuous soft tissue sleeve. The patella was subluxed laterally and the patellofemoral ligament was incised. Inspection of the knee demonstrated severe degenerative changes with full-thickness loss of articular cartilage. Osteophytes were debrided using a rongeur. Anterior and posterior cruciate ligaments were excised. Two 4.0 mm Schanz pins were inserted in the femur and into the tibia for attachment of the array of trackers used for computer-assisted navigation. Hip center was identified using a circumduction technique. Distal landmarks were mapped using the computer. The distal femur and proximal tibia were mapped using the computer. The distal femoral cutting guide was positioned using computer-assisted navigation so as to achieve a 5 distal valgus cut. The femur was sized and it was felt that a size 6 femoral component was appropriate. A size 6 femoral cutting guide was positioned and the anterior cut was performed and verified using the computer. This was followed by completion of the posterior and chamfer cuts. Femoral cutting guide for the central box was then positioned in the center box cut was performed.  Attention was then directed to the proximal tibia. Medial and lateral menisci were excised. The extramedullary tibial cutting guide was positioned using computer-assisted navigation so as to achieve a 0 varus-valgus alignment and 3 posterior slope.  The cut was performed and verified using the computer. The  proximal tibia was sized and it was felt that a size 7 tibial tray was appropriate. Tibial and femoral trials were inserted followed by insertion of a 5 mm polyethylene insert. The knee was felt to be tight medially. A Cobb elevator was used to elevate the superficial fibers of the medial collateral ligament. This allowed for excellent mediolateral soft tissue balancing both in flexion and in full extension. Finally, the patella was cut and prepared so as to accommodate a 41 mm medialized dome patella. A patella trial was placed and the knee was placed through a range of motion with excellent patellar tracking appreciated. The femoral trial was removed after debridement of posterior osteophytes. The central post-hole for the tibial component was reamed followed by insertion of a keel punch. Tibial trials were then removed. Cut surfaces of bone were irrigated with copious amounts of normal saline using pulsatile lavage and then suctioned dry. Polymethylmethacrylate cement with gentamicin was prepared in the usual fashion using a vacuum mixer. Cement was applied to the cut surface of the proximal tibia as well as along the undersurface of a size 7 rotating platform tibial component. Tibial component was positioned and impacted into place. Excess cement was removed using Personal assistant. Cement was then applied to the cut surfaces of the femur as well as along the posterior flanges of the size 6 femoral component. The femoral component was positioned and impacted into place. Excess cement was removed using Personal assistant. A 5 mm polyethylene trial was inserted and the knee was brought into full extension with steady axial compression applied. Finally, cement was applied to the backside of a 41 mm medialized dome patella and the patellar component was positioned and patellar clamp applied. Excess cement was removed using Personal assistant. After adequate curing  of the cement, the tourniquet was deflated after a total tourniquet time of 85 minutes. Hemostasis was achieved using electrocautery. The knee was irrigated with copious amounts of normal saline using pulsatile lavage followed by 450 ml of Surgiphor and then suctioned dry. 20 mL of 1.3% Exparel and 60 mL of 0.25% Marcaine in 40 mL of normal saline was injected along the posterior capsule, medial and lateral gutters, and along the arthrotomy site. A 5 mm stabilized rotating platform polyethylene insert was inserted and the knee was placed through a range of motion with excellent mediolateral soft tissue balancing appreciated and excellent patellar tracking noted. 2 medium drains were placed in the wound bed and brought out through separate stab incisions. The medial parapatellar portion of the incision was reapproximated using interrupted sutures of #1 Vicryl. Subcutaneous tissue was approximated in layers using first #0 Vicryl followed #2-0 Vicryl. The skin was approximated with skin staples. A sterile dressing was applied.  The patient tolerated the procedure well and was transported to the recovery room in stable condition.    Vedant Shehadeh P. Angie Fava., M.D.

## 2023-05-15 NOTE — Progress Notes (Signed)
Patient is not able to walk the distance required to go the bathroom, or he/she is unable to safely negotiate stairs required to access the bathroom.  A 3in1 BSC will alleviate this problem  Jonathan Greer P. Angie Fava M.D.

## 2023-05-15 NOTE — Evaluation (Signed)
Occupational Therapy Evaluation Patient Details Name: Jonathan Greer MRN: [id number] DOB: Feb 06, 1946 Today's Date: 05/15/2023   History of Present Illness Pt is admitted for L TKR, currently POD 0 at time of evaluation.   Clinical Impression   Pt seen for OT evaluation this date, POD O from above surgery. Pt was independent in all ADLs prior to surgery, and driving.  Pt is eager to return to PLOF with less pain and improved safety and independence. Pt currently requires minimal assist for LB dressing while in seated position due to pain and limited AROM of L knee. Pt instructed in polar care mgt, falls prevention strategies, home/routines modifications, DME/AE for LB bathing and dressing tasks, and compression stocking mgt. Pt would benefit from skilled OT services including additional instruction in dressing techniques with or without assistive devices for dressing and bathing skills to support recall and carryover prior to discharge and ultimately to maximize safety, independence, and minimize falls risk and caregiver burden. Do not currently anticipate any OT needs following this hospitalization.          If plan is discharge home, recommend the following: A little help with walking and/or transfers;A little help with bathing/dressing/bathroom;Help with stairs or ramp for entrance;Assistance with cooking/housework    Functional Status Assessment  Patient has had a recent decline in their functional status and demonstrates the ability to make significant improvements in function in a reasonable and predictable amount of time.  Equipment Recommendations  BSC/3in1       Precautions / Restrictions Precautions Precautions: Knee;Fall Precaution Booklet Issued: No Restrictions Weight Bearing Restrictions: Yes LLE Weight Bearing: Weight bearing as tolerated      Mobility Bed Mobility               General bed mobility comments: NT up in recliner    Transfers Overall  transfer level: Needs assistance Equipment used: Rolling walker (2 wheels) Transfers: Sit to/from Stand Sit to Stand: Contact guard assist           General transfer comment: safe technique with RW. Good weight acceptance on surgical leg      Balance Overall balance assessment: Needs assistance Sitting-balance support: Feet supported Sitting balance-Leahy Scale: Good     Standing balance support: Bilateral upper extremity supported Standing balance-Leahy Scale: Good Standing balance comment: steady with static standing to take ~4 steps backwards/forwards with RW                           ADL either performed or assessed with clinical judgement   ADL Overall ADL's : Needs assistance/impaired Eating/Feeding: Set up;Sitting   Grooming: Sitting;Set up   Upper Body Bathing: Sitting;Set up   Lower Body Bathing: Sit to/from stand;Sitting/lateral leans;Contact guard assist   Upper Body Dressing : Sitting;Set up   Lower Body Dressing: Sit to/from stand;Sitting/lateral leans;Minimal assistance   Toilet Transfer: Contact guard assist;Rolling walker (2 wheels);Ambulation;BSC/3in1   Toileting- Clothing Manipulation and Hygiene: Minimal assistance       Functional mobility during ADLs: Rolling walker (2 wheels);Contact guard assist General ADL Comments: Pt return demos use of sock aide with verbal cues.      Pertinent Vitals/Pain Pain Assessment Pain Assessment: 0-10 Pain Score: 2      Extremity/Trunk Assessment Upper Extremity Assessment Upper Extremity Assessment: Overall WFL for tasks assessed   Lower Extremity Assessment Lower Extremity Assessment: Defer to PT evaluation       Communication Communication Communication: No apparent difficulties  Cognition Arousal: Alert Behavior During Therapy: WFL for tasks assessed/performed Overall Cognitive Status: Within Functional Limits for tasks assessed                                  General Comments: pleasant and agreeable to session     General Comments  Wound vac and dressing intact    Exercises Other Exercises Other Exercises: Provided knee replacement OT handout and discussed   Shoulder Instructions      Home Living Family/patient expects to be discharged to:: Private residence Living Arrangements: Spouse/significant other Available Help at Discharge: Family Type of Home: House Home Access: Stairs to enter Secretary/administrator of Steps: 2 Entrance Stairs-Rails: Right Home Layout: One level     Bathroom Shower/Tub: Chief Strategy Officer: Handicapped height     Home Equipment: Tub bench;Hand held shower head          Prior Functioning/Environment Prior Level of Function : Independent/Modified Independent             Mobility Comments: active and mows yards ADLs Comments: independent     including driving        OT Problem List: Decreased range of motion;Decreased strength;Impaired balance (sitting and/or standing);Pain;Decreased knowledge of use of DME or AE;Decreased knowledge of precautions         OT Goals(Current goals can be found in the care plan section) Acute Rehab OT Goals OT Goal Formulation: With patient Time For Goal Achievement: 05/29/23 Potential to Achieve Goals: Good  OT Frequency:         AM-PAC OT "6 Clicks" Daily Activity     Outcome Measure Help from another person eating meals?: None Help from another person taking care of personal grooming?: None Help from another person toileting, which includes using toliet, bedpan, or urinal?: A Little Help from another person bathing (including washing, rinsing, drying)?: A Little Help from another person to put on and taking off regular upper body clothing?: None Help from another person to put on and taking off regular lower body clothing?: A Little 6 Click Score: 21   End of Session Equipment Utilized During Treatment: Rolling walker (2  wheels)  Activity Tolerance: Patient tolerated treatment well Patient left: in chair;with call bell/phone within reach;with chair alarm set  OT Visit Diagnosis: Other abnormalities of gait and mobility (R26.89);Unsteadiness on feet (R26.81)                Time: 0454-0981 OT Time Calculation (min): 20 min Charges:  OT General Charges $OT Visit: 1 Visit OT Evaluation $OT Eval Low Complexity: 1 Low OT Treatments $Self Care/Home Management : 8-22 mins  Yahsir Wickens L. Dominion Kathan, OTR/L  05/15/23, 3:54 PM

## 2023-05-15 NOTE — Discharge Summary (Signed)
Physician Discharge Summary  Subjective: 1 Day Post-Op Procedure(s) (LRB): COMPUTER ASSISTED TOTAL KNEE ARTHROPLASTY (Left) Patient reports pain as mild.   Patient is well, and has had no acute complaints or problems. Denies any CP, SOB, N/V, fevers or chills Patient is ready to go home  Physician Discharge Summary  Patient ID: Jonathan Greer MRN: [id number] DOB/AGE: 20-Dec-1945 77 y.o.  Admit date: 05/15/2023 Discharge date: 05/16/2023  Admission Diagnoses:  Discharge Diagnoses:  Principal Problem:   History of total knee arthroplasty, left   Discharged Condition: good  Hospital Course: Patient presented to the hospital on 05/15/2023 for an elective left total knee arthroplasty performed by Dr. Ernest Pine.  The patient was given 1 g of TXA and 2 g of Ancef perioperatively.  The patient tolerated the procedure well without any complications.  See procedural note for details below.  Postoperatively, the patient did very well.  He was able to pass his PT protocols postop day 1.  His JP drain was removed without any difficulty, intact.  He was able to void his bladder without any difficulty or issue.  His pain has been well-controlled.  He denies any CP, SOB, N/V, fevers or chills.  Patient's vital signs are stable.  Patient is stable for discharge home.  PROCEDURE:  Left total knee arthroplasty using computer-assisted navigation   SURGEON:  Jena Gauss. M.D.   ASSISTANT:  Gean Birchwood, PA-C (present and scrubbed throughout the case, critical for assistance with exposure, retraction, instrumentation, and closure)   ANESTHESIA: spinal   ESTIMATED BLOOD LOSS: 50 mL   FLUIDS REPLACED: 600 mL of crystalloid   TOURNIQUET TIME: 85 minutes   DRAINS: 2 medium Hemovac drains   SOFT TISSUE RELEASES: Anterior cruciate ligament, posterior cruciate ligament, deep and superficial medial collateral ligament, patellofemoral ligament   IMPLANTS UTILIZED: DePuy Attune size 6 posterior  stabilized femoral component (cemented), size 7 rotating platform tibial component (cemented), 41 mm medialized dome patella (cemented), and a 5 mm stabilized rotating platform polyethylene insert.  Treatments: none  Discharge Exam: Blood pressure 129/74, pulse (!) 58, temperature 97.9 F (36.6 C), resp. rate 16, height 5\' 10"  (1.778 m), weight 113.9 kg, SpO2 97%.   Disposition: home   Allergies as of 05/16/2023       Reactions   Atorvastatin    Hip and knee pain. Tolerates in low doses   Pravastatin    Hip pain        Medication List     TAKE these medications    aspirin EC 81 MG tablet Take 1 tablet (81 mg total) by mouth in the morning and at bedtime. Swallow whole.   atorvastatin 20 MG tablet Commonly known as: LIPITOR TAKE ONE TABLET THREE DAYS A WEEK   BEET ROOT PO Take 1 tablet by mouth daily at 6 (six) AM.   celecoxib 200 MG capsule Commonly known as: CeleBREX Take 1 capsule (200 mg total) by mouth 2 (two) times daily.   oxyCODONE 5 MG immediate release tablet Commonly known as: Roxicodone Take 1 tablet (5 mg total) by mouth every 4 (four) hours as needed for severe pain (pain score 7-10).   traMADol 50 MG tablet Commonly known as: Ultram Take 1-2 tablets (50-100 mg total) by mouth every 6 (six) hours as needed for moderate pain (pain score 4-6).   True Metrix Blood Glucose Test test strip Generic drug: glucose blood SMARTSIG:Via Meter   TRUEplus Lancets 33G Misc  Durable Medical Equipment  (From admission, onward)           Start     Ordered   05/15/23 1104  DME Walker rolling  Once       Question:  Patient needs a walker to treat with the following condition  Answer:  Total knee replacement status   05/15/23 1103   05/15/23 1104  DME Bedside commode  Once       Comments: Patient is not able to walk the distance required to go the bathroom, or he/she is unable to safely negotiate stairs required to access the bathroom.   A 3in1 BSC will alleviate this problem  Question:  Patient needs a bedside commode to treat with the following condition  Answer:  Total knee replacement status   05/15/23 1103            Follow-up Information     Rayburn Go, PA-C Follow up on 05/30/2023.   Specialty: Orthopedic Surgery Why: at 1:45pm Contact information: [address] [place] [address] [phone]         Donato Heinz, MD Follow up on 07/02/2023.   Specialty: Orthopedic Surgery Why: at 2:45pm Contact information: [address] [facility] [place] [address] [phone]                 Signed: Meriel Pica 05/16/2023, 8:22 AM   Objective: Vital signs in last 24 hours: Temp:  [97.5 F (36.4 C)-98 F (36.7 C)] 97.9 F (36.6 C) (11/28 0736) Pulse Rate:  [58-69] 58 (11/28 0736) Resp:  [16-22] 16 (11/28 0736) BP: (106-129)/(64-85) 129/74 (11/28 0736) SpO2:  [95 %-99 %] 97 % (11/28 0736)  Intake/Output from previous day:  Intake/Output Summary (Last 24 hours) at 05/16/2023 0822 Last data filed at 05/16/2023 0716 Gross per 24 hour  Intake 2173.47 ml  Output 635 ml  Net 1538.47 ml    Intake/Output this shift: Total I/O In: 748.5 [I.V.:548.5; IV Piggyback:200] Out: -   Labs: No results for input(s): "HGB" in the last 72 hours. No results for input(s): "WBC", "RBC", "HCT", "PLT" in the last 72 hours. No results for input(s): "NA", "K", "CL", "CO2", "BUN", "CREATININE", "GLUCOSE", "CALCIUM" in the last 72 hours. No results for input(s): "LABPT", "INR" in the last 72 hours.  EXAM: General - Patient is Alert, Appropriate, and Oriented Extremity - Neurologically intact ABD soft Neurovascular intact Sensation intact distally Intact pulses distally Dorsiflexion/Plantar flexion intact No cellulitis present Compartment soft Dressing - dressing C/D/I and no drainage Motor Function - intact, moving foot and toes well on exam. Able to plantar  and dorsi flex with good strength and ROM.  Neurovascularly intact all dermatomes down his left lower extremity.  Posterior tibial pulses appreciated Bulky dressing removed, hemovac removed. Drain had become clogged, moderate drainage upon removing drain. Stopped with gentle pressure and 4x4 with tegaderm applied.   Assessment/Plan: 1 Day Post-Op Procedure(s) (LRB): COMPUTER ASSISTED TOTAL KNEE ARTHROPLASTY (Left) Procedure(s) (LRB): COMPUTER ASSISTED TOTAL KNEE ARTHROPLASTY (Left) Past Medical History:  Diagnosis Date   Arthritis of left knee    Basal cell carcinoma    nose   Blood pressure elevated without history of HTN    Colon polyps    Complex renal cyst    Diverticulosis of large intestine without diverticulitis    Hemorrhoids    Hepatic steatosis    History of colonic polyps    History of kidney stones    Hyperlipemia 07/07/2015  Hyperlipidemia    Morbid obesity (HCC)    Pre-diabetes    Shingles 07/07/2015   Varicose vein of leg    Wears dentures    Principal Problem:   History of total knee arthroplasty, left  Estimated body mass index is 36.03 kg/m as calculated from the following:   Height as of this encounter: 5\' 10"  (1.778 m).   Weight as of this encounter: 113.9 kg.  Patient will continue to work with [facility] physical therapy on gait, ROM and strengthening   Discussed with the patient continuing to utilize Polar Care   Patient will use bone foam in 20-30 minute intervals   Patient will wear TED hose bilaterally to help prevent DVT and clot formation   Discussed the Aquacel bandage.  This bandage will stay in place 7 days postoperatively.  Can be replaced with honeycomb bandages that will be sent home with the patient   Discussed sending the patient home with tramadol and oxycodone for as needed pain management.  Patient will also be sent home with Celebrex to help with swelling and inflammation.  Patient will take an 81 mg aspirin twice daily for DVT  prophylaxis   JP drain removed without difficulty, intact   Weight-Bearing as tolerated to left leg   Patient will follow-up with [facility] orthopedics in 2 weeks for staple removal and reevaluation  Diet - Diabetic diet Follow up - in 2 weeks Activity - WBAT Disposition - Home Condition Upon Discharge - Good DVT Prophylaxis - Aspirin and TED hose  J. Horris Latino, PA-C Orthopaedic Surgery 05/16/2023, 8:22 AM

## 2023-05-15 NOTE — Plan of Care (Signed)
  Problem: Clinical Measurements: Goal: Postoperative complications will be avoided or minimized Outcome: Progressing   Problem: Pain Management: Goal: Pain level will decrease with appropriate interventions Outcome: Progressing   Problem: Activity: Goal: Ability to avoid complications of mobility impairment will improve Outcome: Progressing   Problem: Clinical Measurements: Goal: Ability to maintain clinical measurements within normal limits will improve Outcome: Progressing

## 2023-05-15 NOTE — Transfer of Care (Signed)
Immediate Anesthesia Transfer of Care Note  Patient: VASH PREVOT  Procedure(s) Performed: COMPUTER ASSISTED TOTAL KNEE ARTHROPLASTY (Left: Knee)  Patient Location: PACU  Anesthesia Type:Spinal  Level of Consciousness: awake  Airway & Oxygen Therapy: Patient Spontanous Breathing and Patient connected to nasal cannula oxygen  Post-op Assessment: Report given to RN and Post -op Vital signs reviewed and stable  Post vital signs: Reviewed  Last Vitals:  Vitals Value Taken Time  BP 114/74 05/15/23 1106  Temp    Pulse 70 05/15/23 1110  Resp 14 05/15/23 1110  SpO2 97 % 05/15/23 1110  Vitals shown include unfiled device data.  Last Pain:  Vitals:   05/15/23 0625  TempSrc: Temporal  PainSc: 0-No pain      Patients Stated Pain Goal: 0 (05/15/23 5732)  Complications: No notable events documented.

## 2023-05-16 DIAGNOSIS — R7309 Other abnormal glucose: Secondary | ICD-10-CM | POA: Diagnosis not present

## 2023-05-16 DIAGNOSIS — M1712 Unilateral primary osteoarthritis, left knee: Secondary | ICD-10-CM | POA: Diagnosis not present

## 2023-05-16 LAB — GLUCOSE, CAPILLARY: Glucose-Capillary: 125 mg/dL — ABNORMAL HIGH (ref 70–99)

## 2023-05-16 MED ORDER — CELECOXIB 200 MG PO CAPS: 200.0000 mg | ORAL_CAPSULE | Freq: Two times a day (BID) | ORAL | 1 refills | Status: AC

## 2023-05-16 MED ORDER — ASPIRIN 81 MG PO TBEC: 81.0000 mg | DELAYED_RELEASE_TABLET | Freq: Two times a day (BID) | ORAL | 1 refills | Status: DC

## 2023-05-16 MED ORDER — OXYCODONE HCL 5 MG PO TABS: 5.0000 mg | ORAL_TABLET | ORAL | 0 refills | Status: DC | PRN

## 2023-05-16 MED ORDER — TRAMADOL HCL 50 MG PO TABS: 50.0000 mg | ORAL_TABLET | Freq: Four times a day (QID) | ORAL | 0 refills | Status: DC | PRN

## 2023-05-16 NOTE — Anesthesia Postprocedure Evaluation (Signed)
Anesthesia Post Note  Patient: Jonathan Greer  Procedure(s) Performed: COMPUTER ASSISTED TOTAL KNEE ARTHROPLASTY (Left: Knee)  Patient location during evaluation: Nursing Unit Anesthesia Type: Spinal Level of consciousness: awake and alert and oriented Pain management: pain level controlled Vital Signs Assessment: post-procedure vital signs reviewed and stable Respiratory status: spontaneous breathing, respiratory function stable and nonlabored ventilation Cardiovascular status: blood pressure returned to baseline Postop Assessment: no headache, no backache, no apparent nausea or vomiting and able to ambulate Anesthetic complications: no Comments: Patient discharged prior to in-person postoperative evaluation could be completed.  This note is based on chart review.   No notable events documented.   Last Vitals:  Vitals:   05/15/23 2339 05/16/23 0736  BP: 106/64 129/74  Pulse: 65 (!) 58  Resp: 16 16  Temp: 36.7 C 36.6 C  SpO2: 95% 97%    Last Pain:  Vitals:   05/16/23 0605  TempSrc:   PainSc: 0-No pain                 Reed Breech

## 2023-05-16 NOTE — TOC Transition Note (Signed)
Transition of Care [facility]) - CM/SW Discharge Note   Patient Details  Name: Jonathan Greer MRN: [id number] Date of Birth: February 17, 1946  [facility]) CM/SW Contact:  Garret Reddish, RN Phone Number: 05/16/2023, 10:27 AM   Clinical Narrative:    Chart reviewed.  Noted that patient has orders for discharge today.    I have informed patient that [facility] has been arranged prior to admission to provide PT/OT services.  Patient would like to use their services on discharge.  I have LVM for [place] with [facility] that patient would be a discharge for today.    [employer] has provided patient with RW and BSC.    I have informed staff nurse of the above information.    Final next level of care: Home w Home Health Services Barriers to Discharge: No Barriers Identified   Patient Goals and CMS Choice CMS Medicare.gov Compare Post Acute Care list provided to:: Patient Choice offered to / list presented to : Patient  Discharge Placement                      Patient and family notified of of transfer: 05/16/23  Discharge Plan and Services Additional resources added to the After Visit Summary for                  DME Arranged:  (Patient has been provided RW and BSC via [employer])         HH Arranged: PT, OT HH Agency: [facility] Date [facility] Agency Contacted: 05/16/23   Representative spoke with at [facility] Agency: LVM [place]  Social Determinants of Health (SDOH) Interventions SDOH Screenings   Food Insecurity: No Food Insecurity (05/15/2023)  Housing: Low Risk  (05/15/2023)  Transportation Needs: No Transportation Needs (05/15/2023)  Utilities: Not At Risk (05/15/2023)  Alcohol Screen: Low Risk  (03/13/2023)  Depression (PHQ2-9): Low Risk  (03/13/2023)  Financial Resource Strain: Low Risk  (03/13/2023)  Physical Activity: Sufficiently Active (03/13/2023)  Social Connections: Moderately Integrated (03/13/2023)  Stress: No Stress Concern Present  (03/13/2023)  Tobacco Use: Medium Risk (05/15/2023)  Health Literacy: Adequate Health Literacy (03/13/2023)     Readmission Risk Interventions     No data to display

## 2023-05-16 NOTE — Progress Notes (Signed)
Physical Therapy Treatment Patient Details Name: Jonathan Greer MRN: [id number] DOB: 07-29-1945 Today's Date: 05/16/2023   History of Present Illness Pt is admitted for L TKR, currently POD 0 at time of evaluation.    PT Comments  Pt received supine in bed agreeable to PT services. Pt progressing in mobility all at independent to mod-I levels using RW safely with transfers. Pt completes ~300' of gait progressing to step through pattern requiring min VC's for correct RW proximity and LLE heel strike at initial contact with good carryover. Pt is able to safely complete stair training  and reviewed polar care and car transfers post gait and stair training. Pt left in recliner with L knee left in extension. Pt has met all PT goals and is appropriate for d/c to home setting with follow up PT recs remaining appropriate. All needs in reach.    If plan is discharge home, recommend the following: A little help with walking and/or transfers;Help with stairs or ramp for entrance   Can travel by private vehicle        Equipment Recommendations  Rolling walker (2 wheels);BSC/3in1    Recommendations for Other Services       Precautions / Restrictions Precautions Precautions: Knee;Fall Precaution Booklet Issued: Yes (comment) Restrictions Weight Bearing Restrictions: Yes LLE Weight Bearing: Weight bearing as tolerated     Mobility  Bed Mobility Overal bed mobility: Modified Independent Bed Mobility: Supine to Sit     Supine to sit: Modified independent (Device/Increase time)       Patient Response: Cooperative  Transfers Overall transfer level: Needs assistance Equipment used: Rolling walker (2 wheels) Transfers: Sit to/from Stand Sit to Stand: Independent           General transfer comment: Safe hand placement    Ambulation/Gait Ambulation/Gait assistance: Modified independent (Device/Increase time) Gait Distance (Feet): 300 Feet Assistive device: Rolling walker (2  wheels) Gait Pattern/deviations: Step-through pattern, Decreased stance time - left       General Gait Details: Progressed to consistent step through pattern and heel strike with LLE. VC's for RW proximity with good carryover.   Stairs Stairs: Yes Stairs assistance: Supervision Stair Management: One rail Right, Step to pattern, Forwards Number of Stairs: 2 General stair comments: VC's for correct LE sequencing with excellent carryover.   Wheelchair Mobility     Tilt Bed Tilt Bed Patient Response: Cooperative  Modified Rankin (Stroke Patients Only)       Balance Overall balance assessment: Modified Independent   Sitting balance-Leahy Scale: Normal     Standing balance support: Bilateral upper extremity supported, Reliant on assistive device for balance Standing balance-Leahy Scale: Good                              Cognition Arousal: Alert Behavior During Therapy: WFL for tasks assessed/performed Overall Cognitive Status: Within Functional Limits for tasks assessed                                 General Comments: pleasant and agreeable to session        Exercises Total Joint Exercises Ankle Circles/Pumps: AROM, Strengthening, Both, 5 reps, Supine Quad Sets: AROM, Strengthening, Left, 5 reps, Supine Heel Slides: AROM, Strengthening, Left, 5 reps, Supine Hip ABduction/ADduction: AROM, Strengthening, Left, 5 reps, Supine Straight Leg Raises: AROM, Strengthening, Left, Supine Knee Flexion: AROM, Strengthening, Left, 5 reps, Supine Goniometric  ROM: 0-89 L knee AROM Other Exercises Other Exercises: education on HEP hand out (reps/sets/frequency), gait training, car transfer, and stair transfer.    General Comments        Pertinent Vitals/Pain Pain Assessment Pain Assessment: No/denies pain    Home Living                          Prior Function            PT Goals (current goals can now be found in the care plan  section) Acute Rehab PT Goals Patient Stated Goal: to go home PT Goal Formulation: With patient Time For Goal Achievement: 05/29/23 Potential to Achieve Goals: Good Progress towards PT goals: Progressing toward goals    Frequency    BID      PT Plan      Co-evaluation              AM-PAC PT "6 Clicks" Mobility   Outcome Measure  Help needed turning from your back to your side while in a flat bed without using bedrails?: None Help needed moving from lying on your back to sitting on the side of a flat bed without using bedrails?: None Help needed moving to and from a bed to a chair (including a wheelchair)?: A Little Help needed standing up from a chair using your arms (e.g., wheelchair or bedside chair)?: A Little Help needed to walk in hospital room?: A Little Help needed climbing 3-5 steps with a railing? : A Little 6 Click Score: 20    End of Session Equipment Utilized During Treatment: Gait belt Activity Tolerance: Patient tolerated treatment well Patient left: in chair;with chair alarm set Nurse Communication: Mobility status PT Visit Diagnosis: Muscle weakness (generalized) (M62.81);Difficulty in walking, not elsewhere classified (R26.2);Pain Pain - Right/Left: Left Pain - part of body: Knee     Time: 5409-8119 PT Time Calculation (min) (ACUTE ONLY): 17 min  Charges:    $Gait Training: 8-22 mins PT General Charges $$ ACUTE PT VISIT: 1 Visit                     Delphia Grates. Fairly IV, PT, DPT Physical Therapist- [place]  [facility]  05/16/2023, 9:53 AM

## 2023-05-17 DIAGNOSIS — Z7982 Long term (current) use of aspirin: Secondary | ICD-10-CM | POA: Diagnosis not present

## 2023-05-17 DIAGNOSIS — Z791 Long term (current) use of non-steroidal anti-inflammatories (NSAID): Secondary | ICD-10-CM | POA: Diagnosis not present

## 2023-05-17 DIAGNOSIS — Z471 Aftercare following joint replacement surgery: Secondary | ICD-10-CM | POA: Diagnosis not present

## 2023-05-17 DIAGNOSIS — E785 Hyperlipidemia, unspecified: Secondary | ICD-10-CM | POA: Diagnosis not present

## 2023-05-17 DIAGNOSIS — K5730 Diverticulosis of large intestine without perforation or abscess without bleeding: Secondary | ICD-10-CM | POA: Diagnosis not present

## 2023-05-17 DIAGNOSIS — Z96652 Presence of left artificial knee joint: Secondary | ICD-10-CM | POA: Diagnosis not present

## 2023-05-17 DIAGNOSIS — K760 Fatty (change of) liver, not elsewhere classified: Secondary | ICD-10-CM | POA: Diagnosis not present

## 2023-05-17 DIAGNOSIS — Z79891 Long term (current) use of opiate analgesic: Secondary | ICD-10-CM | POA: Diagnosis not present

## 2023-05-17 DIAGNOSIS — Z85828 Personal history of other malignant neoplasm of skin: Secondary | ICD-10-CM | POA: Diagnosis not present

## 2023-05-17 DIAGNOSIS — R7303 Prediabetes: Secondary | ICD-10-CM | POA: Diagnosis not present

## 2023-05-17 DIAGNOSIS — I8390 Asymptomatic varicose veins of unspecified lower extremity: Secondary | ICD-10-CM | POA: Diagnosis not present

## 2023-05-18 ENCOUNTER — Encounter: Payer: Self-pay | Admitting: Orthopedic Surgery

## 2023-05-19 DIAGNOSIS — E785 Hyperlipidemia, unspecified: Secondary | ICD-10-CM | POA: Diagnosis not present

## 2023-05-19 DIAGNOSIS — Z85828 Personal history of other malignant neoplasm of skin: Secondary | ICD-10-CM | POA: Diagnosis not present

## 2023-05-19 DIAGNOSIS — Z7982 Long term (current) use of aspirin: Secondary | ICD-10-CM | POA: Diagnosis not present

## 2023-05-19 DIAGNOSIS — Z471 Aftercare following joint replacement surgery: Secondary | ICD-10-CM | POA: Diagnosis not present

## 2023-05-19 DIAGNOSIS — R7303 Prediabetes: Secondary | ICD-10-CM | POA: Diagnosis not present

## 2023-05-19 DIAGNOSIS — K760 Fatty (change of) liver, not elsewhere classified: Secondary | ICD-10-CM | POA: Diagnosis not present

## 2023-05-19 DIAGNOSIS — Z79891 Long term (current) use of opiate analgesic: Secondary | ICD-10-CM | POA: Diagnosis not present

## 2023-05-19 DIAGNOSIS — Z96652 Presence of left artificial knee joint: Secondary | ICD-10-CM | POA: Diagnosis not present

## 2023-05-19 DIAGNOSIS — Z791 Long term (current) use of non-steroidal anti-inflammatories (NSAID): Secondary | ICD-10-CM | POA: Diagnosis not present

## 2023-05-19 DIAGNOSIS — I8390 Asymptomatic varicose veins of unspecified lower extremity: Secondary | ICD-10-CM | POA: Diagnosis not present

## 2023-05-19 DIAGNOSIS — K5730 Diverticulosis of large intestine without perforation or abscess without bleeding: Secondary | ICD-10-CM | POA: Diagnosis not present

## 2023-05-20 DIAGNOSIS — Z79891 Long term (current) use of opiate analgesic: Secondary | ICD-10-CM | POA: Diagnosis not present

## 2023-05-20 DIAGNOSIS — E785 Hyperlipidemia, unspecified: Secondary | ICD-10-CM | POA: Diagnosis not present

## 2023-05-20 DIAGNOSIS — Z85828 Personal history of other malignant neoplasm of skin: Secondary | ICD-10-CM | POA: Diagnosis not present

## 2023-05-20 DIAGNOSIS — K5730 Diverticulosis of large intestine without perforation or abscess without bleeding: Secondary | ICD-10-CM | POA: Diagnosis not present

## 2023-05-20 DIAGNOSIS — R7303 Prediabetes: Secondary | ICD-10-CM | POA: Diagnosis not present

## 2023-05-20 DIAGNOSIS — Z791 Long term (current) use of non-steroidal anti-inflammatories (NSAID): Secondary | ICD-10-CM | POA: Diagnosis not present

## 2023-05-20 DIAGNOSIS — Z7982 Long term (current) use of aspirin: Secondary | ICD-10-CM | POA: Diagnosis not present

## 2023-05-20 DIAGNOSIS — Z96652 Presence of left artificial knee joint: Secondary | ICD-10-CM | POA: Diagnosis not present

## 2023-05-20 DIAGNOSIS — Z471 Aftercare following joint replacement surgery: Secondary | ICD-10-CM | POA: Diagnosis not present

## 2023-05-20 DIAGNOSIS — I8390 Asymptomatic varicose veins of unspecified lower extremity: Secondary | ICD-10-CM | POA: Diagnosis not present

## 2023-05-20 DIAGNOSIS — K760 Fatty (change of) liver, not elsewhere classified: Secondary | ICD-10-CM | POA: Diagnosis not present

## 2023-05-22 DIAGNOSIS — I8390 Asymptomatic varicose veins of unspecified lower extremity: Secondary | ICD-10-CM | POA: Diagnosis not present

## 2023-05-22 DIAGNOSIS — Z96652 Presence of left artificial knee joint: Secondary | ICD-10-CM | POA: Diagnosis not present

## 2023-05-22 DIAGNOSIS — K5730 Diverticulosis of large intestine without perforation or abscess without bleeding: Secondary | ICD-10-CM | POA: Diagnosis not present

## 2023-05-22 DIAGNOSIS — Z791 Long term (current) use of non-steroidal anti-inflammatories (NSAID): Secondary | ICD-10-CM | POA: Diagnosis not present

## 2023-05-22 DIAGNOSIS — R7303 Prediabetes: Secondary | ICD-10-CM | POA: Diagnosis not present

## 2023-05-22 DIAGNOSIS — Z7982 Long term (current) use of aspirin: Secondary | ICD-10-CM | POA: Diagnosis not present

## 2023-05-22 DIAGNOSIS — Z471 Aftercare following joint replacement surgery: Secondary | ICD-10-CM | POA: Diagnosis not present

## 2023-05-22 DIAGNOSIS — Z79891 Long term (current) use of opiate analgesic: Secondary | ICD-10-CM | POA: Diagnosis not present

## 2023-05-22 DIAGNOSIS — K760 Fatty (change of) liver, not elsewhere classified: Secondary | ICD-10-CM | POA: Diagnosis not present

## 2023-05-22 DIAGNOSIS — E785 Hyperlipidemia, unspecified: Secondary | ICD-10-CM | POA: Diagnosis not present

## 2023-05-22 DIAGNOSIS — Z85828 Personal history of other malignant neoplasm of skin: Secondary | ICD-10-CM | POA: Diagnosis not present

## 2023-05-24 DIAGNOSIS — Z79891 Long term (current) use of opiate analgesic: Secondary | ICD-10-CM | POA: Diagnosis not present

## 2023-05-24 DIAGNOSIS — Z96652 Presence of left artificial knee joint: Secondary | ICD-10-CM | POA: Diagnosis not present

## 2023-05-24 DIAGNOSIS — Z85828 Personal history of other malignant neoplasm of skin: Secondary | ICD-10-CM | POA: Diagnosis not present

## 2023-05-24 DIAGNOSIS — I8390 Asymptomatic varicose veins of unspecified lower extremity: Secondary | ICD-10-CM | POA: Diagnosis not present

## 2023-05-24 DIAGNOSIS — Z471 Aftercare following joint replacement surgery: Secondary | ICD-10-CM | POA: Diagnosis not present

## 2023-05-24 DIAGNOSIS — K760 Fatty (change of) liver, not elsewhere classified: Secondary | ICD-10-CM | POA: Diagnosis not present

## 2023-05-24 DIAGNOSIS — R7303 Prediabetes: Secondary | ICD-10-CM | POA: Diagnosis not present

## 2023-05-24 DIAGNOSIS — K5730 Diverticulosis of large intestine without perforation or abscess without bleeding: Secondary | ICD-10-CM | POA: Diagnosis not present

## 2023-05-24 DIAGNOSIS — E785 Hyperlipidemia, unspecified: Secondary | ICD-10-CM | POA: Diagnosis not present

## 2023-05-24 DIAGNOSIS — Z791 Long term (current) use of non-steroidal anti-inflammatories (NSAID): Secondary | ICD-10-CM | POA: Diagnosis not present

## 2023-05-24 DIAGNOSIS — Z7982 Long term (current) use of aspirin: Secondary | ICD-10-CM | POA: Diagnosis not present

## 2023-05-27 DIAGNOSIS — Z85828 Personal history of other malignant neoplasm of skin: Secondary | ICD-10-CM | POA: Diagnosis not present

## 2023-05-27 DIAGNOSIS — Z79891 Long term (current) use of opiate analgesic: Secondary | ICD-10-CM | POA: Diagnosis not present

## 2023-05-27 DIAGNOSIS — Z791 Long term (current) use of non-steroidal anti-inflammatories (NSAID): Secondary | ICD-10-CM | POA: Diagnosis not present

## 2023-05-27 DIAGNOSIS — K5730 Diverticulosis of large intestine without perforation or abscess without bleeding: Secondary | ICD-10-CM | POA: Diagnosis not present

## 2023-05-27 DIAGNOSIS — Z96652 Presence of left artificial knee joint: Secondary | ICD-10-CM | POA: Diagnosis not present

## 2023-05-27 DIAGNOSIS — Z471 Aftercare following joint replacement surgery: Secondary | ICD-10-CM | POA: Diagnosis not present

## 2023-05-27 DIAGNOSIS — I8390 Asymptomatic varicose veins of unspecified lower extremity: Secondary | ICD-10-CM | POA: Diagnosis not present

## 2023-05-27 DIAGNOSIS — K760 Fatty (change of) liver, not elsewhere classified: Secondary | ICD-10-CM | POA: Diagnosis not present

## 2023-05-27 DIAGNOSIS — R7303 Prediabetes: Secondary | ICD-10-CM | POA: Diagnosis not present

## 2023-05-27 DIAGNOSIS — E785 Hyperlipidemia, unspecified: Secondary | ICD-10-CM | POA: Diagnosis not present

## 2023-05-27 DIAGNOSIS — Z7982 Long term (current) use of aspirin: Secondary | ICD-10-CM | POA: Diagnosis not present

## 2023-05-29 DIAGNOSIS — K760 Fatty (change of) liver, not elsewhere classified: Secondary | ICD-10-CM | POA: Diagnosis not present

## 2023-05-29 DIAGNOSIS — R7303 Prediabetes: Secondary | ICD-10-CM | POA: Diagnosis not present

## 2023-05-29 DIAGNOSIS — Z7982 Long term (current) use of aspirin: Secondary | ICD-10-CM | POA: Diagnosis not present

## 2023-05-29 DIAGNOSIS — Z471 Aftercare following joint replacement surgery: Secondary | ICD-10-CM | POA: Diagnosis not present

## 2023-05-29 DIAGNOSIS — I8390 Asymptomatic varicose veins of unspecified lower extremity: Secondary | ICD-10-CM | POA: Diagnosis not present

## 2023-05-29 DIAGNOSIS — Z85828 Personal history of other malignant neoplasm of skin: Secondary | ICD-10-CM | POA: Diagnosis not present

## 2023-05-29 DIAGNOSIS — E785 Hyperlipidemia, unspecified: Secondary | ICD-10-CM | POA: Diagnosis not present

## 2023-05-29 DIAGNOSIS — K5730 Diverticulosis of large intestine without perforation or abscess without bleeding: Secondary | ICD-10-CM | POA: Diagnosis not present

## 2023-05-29 DIAGNOSIS — Z79891 Long term (current) use of opiate analgesic: Secondary | ICD-10-CM | POA: Diagnosis not present

## 2023-05-29 DIAGNOSIS — Z791 Long term (current) use of non-steroidal anti-inflammatories (NSAID): Secondary | ICD-10-CM | POA: Diagnosis not present

## 2023-05-29 DIAGNOSIS — Z96652 Presence of left artificial knee joint: Secondary | ICD-10-CM | POA: Diagnosis not present

## 2023-05-30 DIAGNOSIS — Z96651 Presence of right artificial knee joint: Secondary | ICD-10-CM | POA: Diagnosis not present

## 2023-05-30 DIAGNOSIS — Z471 Aftercare following joint replacement surgery: Secondary | ICD-10-CM | POA: Diagnosis not present

## 2023-05-30 DIAGNOSIS — M6281 Muscle weakness (generalized): Secondary | ICD-10-CM | POA: Diagnosis not present

## 2023-05-30 DIAGNOSIS — M25661 Stiffness of right knee, not elsewhere classified: Secondary | ICD-10-CM | POA: Diagnosis not present

## 2023-05-30 DIAGNOSIS — M25561 Pain in right knee: Secondary | ICD-10-CM | POA: Diagnosis not present

## 2023-05-30 DIAGNOSIS — G8929 Other chronic pain: Secondary | ICD-10-CM | POA: Diagnosis not present

## 2023-06-05 DIAGNOSIS — G8929 Other chronic pain: Secondary | ICD-10-CM | POA: Diagnosis not present

## 2023-06-05 DIAGNOSIS — M25662 Stiffness of left knee, not elsewhere classified: Secondary | ICD-10-CM | POA: Diagnosis not present

## 2023-06-05 DIAGNOSIS — M6281 Muscle weakness (generalized): Secondary | ICD-10-CM | POA: Diagnosis not present

## 2023-06-05 DIAGNOSIS — M25561 Pain in right knee: Secondary | ICD-10-CM | POA: Diagnosis not present

## 2023-06-05 DIAGNOSIS — M25562 Pain in left knee: Secondary | ICD-10-CM | POA: Diagnosis not present

## 2023-06-07 DIAGNOSIS — M25562 Pain in left knee: Secondary | ICD-10-CM | POA: Diagnosis not present

## 2023-06-07 DIAGNOSIS — Z96652 Presence of left artificial knee joint: Secondary | ICD-10-CM | POA: Diagnosis not present

## 2023-06-07 DIAGNOSIS — M6281 Muscle weakness (generalized): Secondary | ICD-10-CM | POA: Diagnosis not present

## 2023-06-07 DIAGNOSIS — M25662 Stiffness of left knee, not elsewhere classified: Secondary | ICD-10-CM | POA: Diagnosis not present

## 2023-06-07 DIAGNOSIS — G8929 Other chronic pain: Secondary | ICD-10-CM | POA: Diagnosis not present

## 2023-06-11 DIAGNOSIS — M6281 Muscle weakness (generalized): Secondary | ICD-10-CM | POA: Diagnosis not present

## 2023-06-13 DIAGNOSIS — M6281 Muscle weakness (generalized): Secondary | ICD-10-CM | POA: Diagnosis not present

## 2023-06-17 DIAGNOSIS — M6281 Muscle weakness (generalized): Secondary | ICD-10-CM | POA: Diagnosis not present

## 2023-06-17 DIAGNOSIS — Z96652 Presence of left artificial knee joint: Secondary | ICD-10-CM | POA: Diagnosis not present

## 2023-06-17 DIAGNOSIS — M25662 Stiffness of left knee, not elsewhere classified: Secondary | ICD-10-CM | POA: Diagnosis not present

## 2023-06-17 DIAGNOSIS — M25562 Pain in left knee: Secondary | ICD-10-CM | POA: Diagnosis not present

## 2023-06-17 DIAGNOSIS — G8929 Other chronic pain: Secondary | ICD-10-CM | POA: Diagnosis not present

## 2023-06-21 ENCOUNTER — Ambulatory Visit: Payer: Medicare Other | Admitting: Family Medicine

## 2023-06-21 DIAGNOSIS — Z96652 Presence of left artificial knee joint: Secondary | ICD-10-CM | POA: Diagnosis not present

## 2023-06-21 DIAGNOSIS — M25562 Pain in left knee: Secondary | ICD-10-CM | POA: Diagnosis not present

## 2023-06-21 DIAGNOSIS — M25662 Stiffness of left knee, not elsewhere classified: Secondary | ICD-10-CM | POA: Diagnosis not present

## 2023-06-21 DIAGNOSIS — M6281 Muscle weakness (generalized): Secondary | ICD-10-CM | POA: Diagnosis not present

## 2023-06-21 DIAGNOSIS — G8929 Other chronic pain: Secondary | ICD-10-CM | POA: Diagnosis not present

## 2023-06-25 DIAGNOSIS — Z96652 Presence of left artificial knee joint: Secondary | ICD-10-CM | POA: Diagnosis not present

## 2023-06-25 DIAGNOSIS — M25562 Pain in left knee: Secondary | ICD-10-CM | POA: Diagnosis not present

## 2023-06-25 DIAGNOSIS — G8929 Other chronic pain: Secondary | ICD-10-CM | POA: Diagnosis not present

## 2023-06-25 DIAGNOSIS — M6281 Muscle weakness (generalized): Secondary | ICD-10-CM | POA: Diagnosis not present

## 2023-06-25 DIAGNOSIS — M25662 Stiffness of left knee, not elsewhere classified: Secondary | ICD-10-CM | POA: Diagnosis not present

## 2023-07-02 DIAGNOSIS — Z96652 Presence of left artificial knee joint: Secondary | ICD-10-CM | POA: Diagnosis not present

## 2023-07-10 ENCOUNTER — Encounter: Payer: Medicare Other | Admitting: Family Medicine

## 2023-07-12 ENCOUNTER — Ambulatory Visit (INDEPENDENT_AMBULATORY_CARE_PROVIDER_SITE_OTHER): Payer: Medicare Other | Admitting: Family Medicine

## 2023-07-12 ENCOUNTER — Encounter: Payer: Self-pay | Admitting: Family Medicine

## 2023-07-12 VITALS — BP 136/96 | HR 69 | Resp 20 | Ht 70.0 in | Wt 261.2 lb

## 2023-07-12 DIAGNOSIS — R7303 Prediabetes: Secondary | ICD-10-CM | POA: Diagnosis not present

## 2023-07-12 DIAGNOSIS — R030 Elevated blood-pressure reading, without diagnosis of hypertension: Secondary | ICD-10-CM

## 2023-07-12 DIAGNOSIS — Z0000 Encounter for general adult medical examination without abnormal findings: Secondary | ICD-10-CM | POA: Diagnosis not present

## 2023-07-12 DIAGNOSIS — E785 Hyperlipidemia, unspecified: Secondary | ICD-10-CM

## 2023-07-12 DIAGNOSIS — Z125 Encounter for screening for malignant neoplasm of prostate: Secondary | ICD-10-CM

## 2023-07-12 NOTE — Progress Notes (Signed)
Complete physical exam   Patient: Jonathan Greer   DOB: 1946/01/13   78 y.o. Adult  MRN: [id number] Visit Date: 07/12/2023  Today's healthcare provider: Mila Merry, MD   Chief Complaint  Patient presents with   Annual Exam   Hyperlipidemia   Prediabetes   Subjective    Jonathan Greer is a 78 y.o. adult who presents today for a complete physical exam.  He reports consuming a  regular  diet.  He generally feels well. He reports sleeping fairly well. He does not have additional problems to discuss today.  He had left knee replacement in November which went well, having no pain. Has not been as physically active and has put on some weight since surgery.  Wt Readings from Last 3 Encounters:  07/12/23 261 lb 3.2 oz (118.5 kg)  05/15/23 251 lb 1.7 oz (113.9 kg)  05/08/23 251 lb 1.7 oz (113.9 kg)  Sugars were a little high during hospitalization.  Lab Results  Component Value Date   HGBA1C 6.2 (H) 05/08/2023   HGBA1C 6.5 (H) 12/25/2022   HGBA1C 6.2 (H) 06/19/2022   Tolerating atorvastatin three days a week.  Lab Results  Component Value Date   CHOL 188 12/25/2022   HDL 66 12/25/2022   LDLCALC 105 (H) 12/25/2022   TRIG 96 12/25/2022   CHOLHDL 3.0 06/19/2022   Last metabolic panel Lab Results  Component Value Date   GLUCOSE 83 05/08/2023   NA 138 05/08/2023   K 4.2 05/08/2023   CL 106 05/08/2023   CO2 24 05/08/2023   BUN 23 05/08/2023   CREATININE 1.13 05/08/2023   GFRNONAA >60 05/08/2023   CALCIUM 10.7 (H) 05/08/2023   PROT 8.2 (H) 05/08/2023   ALBUMIN 4.4 05/08/2023   LABGLOB 3.1 06/19/2022   AGRATIO 1.5 06/19/2022   BILITOT 0.9 05/08/2023   ALKPHOS 40 05/08/2023   AST 30 05/08/2023   ALT 35 05/08/2023   ANIONGAP 8 05/08/2023   Last CBC Lab Results  Component Value Date   WBC 6.9 05/08/2023   HGB 16.5 05/08/2023   HCT 47.5 05/08/2023   MCV 90.5 05/08/2023   MCH 31.4 05/08/2023   RDW 13.7 05/08/2023   PLT 198 05/08/2023      Past  Medical History:  Diagnosis Date   Arthritis of left knee    Basal cell carcinoma    nose   Blood pressure elevated without history of HTN    Colon polyps    Complex renal cyst    Diverticulosis of large intestine without diverticulitis    Hemorrhoids    Hepatic steatosis    History of colonic polyps    History of kidney stones    Hyperlipemia 07/07/2015   Hyperlipidemia    Morbid obesity (HCC)    Pre-diabetes    Shingles 07/07/2015   Varicose vein of leg    Wears dentures    Past Surgical History:  Procedure Laterality Date   CATARACT EXTRACTION W/PHACO Left 03/13/2017   Procedure: CATARACT EXTRACTION PHACO AND INTRAOCULAR LENS PLACEMENT (IOC) LEFT;  Surgeon: Lockie Mola, MD;  Location: [facility];  Service: Ophthalmology;  Laterality: Left;  IVA TOPICAL LEFT   CATARACT EXTRACTION W/PHACO Right 04/17/2017   Procedure: CATARACT EXTRACTION PHACO AND INTRAOCULAR LENS PLACEMENT (IOC);  Surgeon: Lockie Mola, MD;  Location: [facility];  Service: Ophthalmology;  Laterality: Right;  IVA TOPICAL RIGHT   COLONOSCOPY WITH PROPOFOL N/A 07/27/2016   Procedure: COLONOSCOPY WITH PROPOFOL;  Surgeon: Sharlet Salina  Tobi Bastos, MD;  Location: [facility] ENDOSCOPY;  Service: Endoscopy;  Laterality: N/A;   COLONOSCOPY WITH PROPOFOL N/A 05/02/2020   Procedure: COLONOSCOPY WITH PROPOFOL;  Surgeon: Wyline Mood, MD;  Location: [facility] ENDOSCOPY;  Service: Gastroenterology;  Laterality: N/A;   COLONOSCOPY WITH PROPOFOL N/A 04/17/2022   Procedure: COLONOSCOPY WITH PROPOFOL;  Surgeon: Wyline Mood, MD;  Location: [facility] ENDOSCOPY;  Service: Gastroenterology;  Laterality: N/A;   HERNIA REPAIR     KNEE ARTHROPLASTY Left 05/15/2023   Procedure: COMPUTER ASSISTED TOTAL KNEE ARTHROPLASTY;  Surgeon: Donato Heinz, MD;  Location: [facility] ORS;  Service: Orthopedics;  Laterality: Left;   MOHS SURGERY  01/10/2016   [facility] for [facility]   TONSILLECTOMY AND ADENOIDECTOMY  1960's   Social History   Socioeconomic  History   Marital status: Married    Spouse name: Not on file   Number of children: 0   Years of education: Not on file   Highest education level: 10th grade  Occupational History   Occupation: Retired  Tobacco Use   Smoking status: Former    Current packs/day: 0.00    Average packs/day: 2.0 packs/day for 20.0 years (40.0 ttl pk-yrs)    Types: Cigarettes    Start date: 06/19/1955    Quit date: 06/19/1975    Years since quitting: 48.0   Smokeless tobacco: Former    Types: Chew   Tobacco comments:    Quit in the 66s  Vaping Use   Vaping status: Never Used  Substance and Sexual Activity   Alcohol use: No    Comment: quit 50 hrs ago   Drug use: No   Sexual activity: Not on file  Other Topics Concern   Not on file  Social History Narrative   Working 5 days a week in [employer]. (06/2016)   Social Drivers of Health   Financial Resource Strain: Low Risk  (05/30/2023)   Received from [facility]   Overall Financial Resource Strain (CARDIA)    Difficulty of Paying Living Expenses: Not hard at all  Food Insecurity: No Food Insecurity (05/30/2023)   Received from [facility]   Hunger Vital Sign    Worried About Running Out of Food in the Last Year: Never true    Ran Out of Food in the Last Year: Never true  Transportation Needs: No Transportation Needs (05/30/2023)   Received from [facility] - Transportation    In the past 12 months, has lack of transportation kept you from medical appointments or from getting medications?: No    Lack of Transportation (Non-Medical): No  Physical Activity: Sufficiently Active (03/13/2023)   Exercise Vital Sign    Days of Exercise per Week: 4 days    Minutes of Exercise per Session: 60 min  Stress: No Stress Concern Present (03/13/2023)   [employer] of Occupational Health - Occupational Stress Questionnaire    Feeling of Stress : Not at all  Social Connections: Moderately  Integrated (03/13/2023)   Social Connection and Isolation Panel [NHANES]    Frequency of Communication with Friends and Family: More than three times a week    Frequency of Social Gatherings with Friends and Family: Once a week    Attends Religious Services: More than 4 times per year    Active Member of [employer] or Organizations: No    Attends Banker Meetings: Never    Marital Status: Married  Catering manager Violence: Not At Risk (05/15/2023)   Humiliation, Afraid, Rape,  and Kick questionnaire    Fear of Current or Ex-Partner: No    Emotionally Abused: No    Physically Abused: No    Sexually Abused: No   Family Status  Relation Name Status   Mother  Deceased at age 38   Father  Deceased at age 42   Brother  Deceased   Brother  Deceased   MGM  Deceased   MGF  Deceased   PGM  Deceased   PGF  Deceased   Brother  Deceased   Neg Hx  (Not Specified)  No partnership data on file   Family History  Problem Relation Age of Onset   Heart failure Mother    CVA Father    Lung cancer Brother    Throat cancer Brother    Liver cancer Maternal Grandmother    Cancer Paternal Grandmother    Cancer Paternal Grandfather    Lung cancer Brother    Bladder Cancer Neg Hx    Prostate cancer Neg Hx    Kidney cancer Neg Hx    Allergies  Allergen Reactions   Atorvastatin     Hip and knee pain. Tolerates in low doses   Pravastatin     Hip pain    Patient Care Team: Malva Limes, MD as PCP - General (Family Medicine) [place], [facility] as Consulting Physician (Optometry) Wyline Mood, MD as Consulting Physician (Gastroenterology) Madlyn Frankel, MD as Referring Physician (Dermatology)   Medications: Outpatient Medications Prior to Visit  Medication Sig   atorvastatin (LIPITOR) 20 MG tablet TAKE ONE TABLET THREE DAYS A WEEK   celecoxib (CELEBREX) 200 MG capsule Take 1 capsule (200 mg total) by mouth 2 (two) times daily.   TRUE METRIX BLOOD GLUCOSE TEST test  strip SMARTSIG:Via Meter   TRUEplus Lancets 33G MISC    No facility-administered medications prior to visit.    Review of Systems  Constitutional:  Negative for appetite change, chills and fever.  Respiratory:  Negative for chest tightness, shortness of breath and wheezing.   Cardiovascular:  Negative for chest pain and palpitations.  Gastrointestinal:  Negative for abdominal pain, nausea and vomiting.      Objective    BP (!) 136/96   Pulse 69   Resp 20   Ht 5\' 10"  (1.778 m)   Wt 261 lb 3.2 oz (118.5 kg)   SpO2 100%   BMI 37.48 kg/m    Physical Exam   General Appearance:    Obese adult. Alert, cooperative, in no acute distress, appears stated age  Head:    Normocephalic, without obvious abnormality, atraumatic  Eyes:    PERRL, conjunctiva/corneas clear, EOM's intact, fundi    benign, both eyes       Ears:    Normal TM's and external ear canals, both ears  Nose:   Nares normal, septum midline, mucosa normal, no drainage   or sinus tenderness  Throat:   Lips, mucosa, and tongue normal; teeth and gums normal  Neck:   Supple, symmetrical, trachea midline, no adenopathy;       thyroid:  No enlargement/tenderness/nodules; no carotid   bruit or JVD  Back:     Symmetric, no curvature, ROM normal, no CVA tenderness  Lungs:     Clear to auscultation bilaterally, respirations unlabored  Chest wall:    No tenderness or deformity  Heart:    Normal heart rate. Normal rhythm. No murmurs, rubs, or gallops.  S1 and S2 normal  Abdomen:  Soft, non-tender, bowel sounds active all four quadrants,    no masses, no organomegaly  Genitalia:    deferred  Rectal:    deferred  Extremities:   All extremities are intact. No cyanosis or edema  Pulses:   2+ and symmetric all extremities  Skin:   Skin color, texture, turgor normal, no rashes or lesions  Lymph nodes:   Cervical, supraclavicular, and axillary nodes normal  Neurologic:   CNII-XII intact. Normal strength, sensation and reflexes       throughout     Last depression screening scores    07/12/2023    8:36 AM 03/13/2023    8:26 AM 08/10/2022    8:50 AM  PHQ 2/9 Scores  PHQ - 2 Score 0 0 0  PHQ- 9 Score 0  0   Last fall risk screening    07/12/2023    8:37 AM  Fall Risk   Falls in the past year? 0  Number falls in past yr: 0  Injury with Fall? 0   Last Audit-C alcohol use screening    03/13/2023    8:25 AM  Alcohol Use Disorder Test (AUDIT)  1. How often do you have a drink containing alcohol? 0  2. How many drinks containing alcohol do you have on a typical day when you are drinking? 0  3. How often do you have six or more drinks on one occasion? 0  AUDIT-C Score 0   A score of 3 or more in women, and 4 or more in men indicates increased risk for alcohol abuse, EXCEPT if all of the points are from question 1   No results found for any visits on 07/12/23.  Assessment & Plan    Routine Health Maintenance and Physical Exam  Exercise Activities and Dietary recommendations  Goals      Diet      Recommend to continue with current diet plan of cutting out all foods that are white (focusing on carbs and sugars).       Prevent falls     Recommend to remove any items from the home that may cause slips or trips.        Immunization History  Administered Date(s) Administered   Fluad Quad(high Dose 65+) 03/20/2019, 03/21/2020, 04/03/2021, 03/02/2022   Influenza, High Dose Seasonal PF 06/27/2016, 04/26/2017, 05/02/2018   Pneumococcal Conjugate-13 10/07/2014   Pneumococcal Polysaccharide-23 03/18/2013   Tdap 06/30/2010, 01/15/2017   Zoster Recombinant(Shingrix) 04/24/2022, 07/17/2022    Health Maintenance  Topic Date Due   COVID-19 Vaccine (1 - 2024-25 season) Never done   INFLUENZA VACCINE  09/16/2023 (Originally 01/17/2023)   Medicare Annual Wellness (AWV)  03/12/2024   DTaP/Tdap/Td (3 - Td or Tdap) 01/16/2027   Pneumonia Vaccine 55+ Years old  Completed   Hepatitis C Screening  Completed    Zoster Vaccines- Shingrix  Completed   HPV VACCINES  Aged Out   DEXA SCAN  Discontinued   Colonoscopy  Discontinued    Discussed health benefits of physical activity, and encouraged him to engage in regular exercise appropriate for his age and condition.    2. Morbid obesity (HCC) Work on diet and exercise.   3. Elevated blood pressure reading Likely up due to weight gain since not being as physically active since knee surgery. Work on diet and exercise to lose weight and recheck BP in 6 months.   4. Prediabetes  - Hemoglobin A1c  5. Hyperlipidemia, unspecified hyperlipidemia type He is tolerating atorvastatin well with no  adverse effects.   - Lipid panel  6. Prostate cancer screening  - PSA   Return in about 6 months (around 01/09/2024) for Blood pressure, prediabetes.        Mila Merry, MD  [facility] [phone] (phone) [phone] (fax)  [facility]

## 2023-07-12 NOTE — Patient Instructions (Addendum)
Please review the attached list of medications and notify my office if there are any errors.   Last recorded height was 5\' 10" . Weight goal for a BMI of 30 is 209.1 pounds.

## 2023-07-13 LAB — LIPID PANEL
Chol/HDL Ratio: 2.7 {ratio} (ref 0.0–5.0)
Cholesterol, Total: 174 mg/dL (ref 100–199)
HDL: 65 mg/dL (ref >39)
LDL Chol Calc (NIH): 94 mg/dL (ref 0–99)
Triglycerides: 78 mg/dL (ref 0–149)
VLDL Cholesterol Cal: 15 mg/dL (ref 5–40)

## 2023-07-13 LAB — PSA: Prostate Specific Ag, Serum: 0.5 ng/mL (ref 0.0–4.0)

## 2023-07-13 LAB — HEMOGLOBIN A1C
Est. average glucose Bld gHb Est-mCnc: 134 mg/dL
Hgb A1c MFr Bld: 6.3 % — ABNORMAL HIGH (ref 4.8–5.6)

## 2023-07-18 ENCOUNTER — Telehealth: Payer: Self-pay | Admitting: Family Medicine

## 2023-07-18 NOTE — Telephone Encounter (Signed)
Wife given lab results and instructions. Verbalizes understanding.

## 2023-11-08 ENCOUNTER — Other Ambulatory Visit (HOSPITAL_COMMUNITY): Payer: Self-pay

## 2023-11-10 ENCOUNTER — Other Ambulatory Visit: Payer: Self-pay | Admitting: Family Medicine

## 2023-11-12 DIAGNOSIS — Z96652 Presence of left artificial knee joint: Secondary | ICD-10-CM | POA: Diagnosis not present

## 2023-11-12 DIAGNOSIS — M1712 Unilateral primary osteoarthritis, left knee: Secondary | ICD-10-CM | POA: Diagnosis not present

## 2024-01-10 ENCOUNTER — Ambulatory Visit (INDEPENDENT_AMBULATORY_CARE_PROVIDER_SITE_OTHER): Payer: Medicare Other | Admitting: Family Medicine

## 2024-01-10 ENCOUNTER — Encounter: Payer: Self-pay | Admitting: Family Medicine

## 2024-01-10 VITALS — BP 128/74 | HR 60 | Resp 16 | Ht 70.0 in | Wt 245.3 lb

## 2024-01-10 DIAGNOSIS — L989 Disorder of the skin and subcutaneous tissue, unspecified: Secondary | ICD-10-CM

## 2024-01-10 DIAGNOSIS — E785 Hyperlipidemia, unspecified: Secondary | ICD-10-CM | POA: Diagnosis not present

## 2024-01-10 DIAGNOSIS — Z85828 Personal history of other malignant neoplasm of skin: Secondary | ICD-10-CM | POA: Diagnosis not present

## 2024-01-10 DIAGNOSIS — R7303 Prediabetes: Secondary | ICD-10-CM

## 2024-01-10 NOTE — Progress Notes (Signed)
 Established patient visit   Patient: Jonathan Greer   DOB: 01-18-1946   78 y.o. Male  MRN: [id number] Visit Date: 01/10/2024  Today's healthcare provider: Nancyann Perry, MD   Chief Complaint  Patient presents with   Medical Management of Chronic Issues   Subjective    Discussed the use of AI scribe software for clinical note transcription with the patient, who gave verbal consent to proceed.  History of Present Illness   Jonathan Greer is a 78 year old male with a history of skin cancer who presents with concerns about a facial lesion and for follow-up on cholesterol and blood sugar levels.  He has noticed a facial lesion for about a year, which he associates with drainage from his mouth while sleeping. He has a history of skin cancer with multiple lesions removed from his face, including one from the corner of his eye that required significant anesthesia and was painful.  He is currently taking atorvastatin  three times a week and tolerates it well. He does not check his blood sugar at home. He tries to avoid sweets and starchy foods.  He has not eaten or drunk anything prior to the visit in preparation for blood work.     Lab Results  Component Value Date   HGBA1C 6.3 (H) 07/12/2023   HGBA1C 6.2 (H) 05/08/2023   HGBA1C 6.5 (H) 12/25/2022   Lab Results  Component Value Date   CHOL 174 07/12/2023   HDL 65 07/12/2023   LDLCALC 94 07/12/2023   TRIG 78 07/12/2023   CHOLHDL 2.7 07/12/2023     Medications: Outpatient Medications Prior to Visit  Medication Sig   atorvastatin  (LIPITOR) 20 MG tablet TAKE ONE TABLET THREE DAYS A WEEK   celecoxib  (CELEBREX ) 200 MG capsule Take 1 capsule (200 mg total) by mouth 2 (two) times daily.   TRUE METRIX BLOOD GLUCOSE TEST test strip SMARTSIG:Via Meter   TRUEplus Lancets 33G MISC    No facility-administered medications prior to visit.   Review of Systems  Respiratory: Negative.  Negative for cough, shortness of breath  and wheezing.   Cardiovascular:  Negative for chest pain, palpitations and leg swelling.  Neurological:  Negative for weakness and headaches.       Objective    BP 128/74 (BP Location: Left Arm, Patient Position: Sitting, Cuff Size: Normal)   Pulse 60   Resp 16   Ht 5' 10 (1.778 m)   Wt 245 lb 4.8 oz (111.3 kg)   SpO2 99%   BMI 35.20 kg/m   Physical Exam   General appearance: Mildly obese male, cooperative and in no acute distress Head: Normocephalic, without obvious abnormality, atraumatic Respiratory: Respirations even and unlabored, normal respiratory rate Extremities: All extremities are intact.  Skin: Multiple raised skin lesions both side of face. Some erythema over lateral cheek bones with about 1cm round lesion with raised non pigmented borders left cheek suspicious for BCC.  Psych: Appropriate mood and affect. Neurologic: Mental status: Alert, oriented to person, place, and time, thought content appropriate.   Assessment & Plan     1. Prediabetes (Primary)  - Hemoglobin A1c  2. Hyperlipidemia, unspecified hyperlipidemia type Tolerating 3 times weekly atorvastatin  - Comprehensive metabolic panel with GFR - Lipid panel  3. Skin lesion of face   4. History of basal cell carcinoma of skin Had not seen dermatology for several years. Refer for skin evaluation       Nancyann Perry, MD  [facility]  [facility] [phone] (phone) [phone] (fax)  [facility]

## 2024-01-11 LAB — LIPID PANEL
Chol/HDL Ratio: 2.7 ratio (ref 0.0–5.0)
Cholesterol, Total: 153 mg/dL (ref 100–199)
HDL: 57 mg/dL (ref >39)
LDL Chol Calc (NIH): 84 mg/dL (ref 0–99)
Triglycerides: 59 mg/dL (ref 0–149)
VLDL Cholesterol Cal: 12 mg/dL (ref 5–40)

## 2024-01-11 LAB — COMPREHENSIVE METABOLIC PANEL WITH GFR
ALT: 24 IU/L (ref 0–44)
AST: 21 IU/L (ref 0–40)
Albumin: 4.6 g/dL (ref 3.8–4.8)
Alkaline Phosphatase: 51 IU/L (ref 44–121)
BUN/Creatinine Ratio: 18 (ref 10–24)
BUN: 21 mg/dL (ref 8–27)
Bilirubin Total: 0.7 mg/dL (ref 0.0–1.2)
CO2: 19 mmol/L — ABNORMAL LOW (ref 20–29)
Calcium: 9.8 mg/dL (ref 8.6–10.2)
Chloride: 104 mmol/L (ref 96–106)
Creatinine, Ser: 1.19 mg/dL (ref 0.76–1.27)
Globulin, Total: 2.9 g/dL (ref 1.5–4.5)
Glucose: 125 mg/dL — ABNORMAL HIGH (ref 70–99)
Potassium: 4.4 mmol/L (ref 3.5–5.2)
Sodium: 141 mmol/L (ref 134–144)
Total Protein: 7.5 g/dL (ref 6.0–8.5)
eGFR: 63 mL/min/1.73 (ref >59)

## 2024-01-11 LAB — HEMOGLOBIN A1C
Est. average glucose Bld gHb Est-mCnc: 140 mg/dL
Hgb A1c MFr Bld: 6.5 % — ABNORMAL HIGH (ref 4.8–5.6)

## 2024-01-12 ENCOUNTER — Ambulatory Visit: Payer: Self-pay | Admitting: Family Medicine

## 2024-01-29 DIAGNOSIS — C4441 Basal cell carcinoma of skin of scalp and neck: Secondary | ICD-10-CM | POA: Diagnosis not present

## 2024-01-29 DIAGNOSIS — D485 Neoplasm of uncertain behavior of skin: Secondary | ICD-10-CM | POA: Diagnosis not present

## 2024-01-29 DIAGNOSIS — C44319 Basal cell carcinoma of skin of other parts of face: Secondary | ICD-10-CM | POA: Diagnosis not present

## 2024-02-04 DIAGNOSIS — M1712 Unilateral primary osteoarthritis, left knee: Secondary | ICD-10-CM | POA: Diagnosis not present

## 2024-02-04 DIAGNOSIS — M1612 Unilateral primary osteoarthritis, left hip: Secondary | ICD-10-CM | POA: Diagnosis not present

## 2024-02-04 DIAGNOSIS — M25552 Pain in left hip: Secondary | ICD-10-CM | POA: Diagnosis not present

## 2024-02-04 DIAGNOSIS — G8929 Other chronic pain: Secondary | ICD-10-CM | POA: Diagnosis not present

## 2024-02-04 DIAGNOSIS — Z96652 Presence of left artificial knee joint: Secondary | ICD-10-CM | POA: Diagnosis not present

## 2024-02-04 DIAGNOSIS — M47816 Spondylosis without myelopathy or radiculopathy, lumbar region: Secondary | ICD-10-CM | POA: Diagnosis not present

## 2024-02-18 DIAGNOSIS — D492 Neoplasm of unspecified behavior of bone, soft tissue, and skin: Secondary | ICD-10-CM | POA: Diagnosis not present

## 2024-02-18 DIAGNOSIS — Z85828 Personal history of other malignant neoplasm of skin: Secondary | ICD-10-CM | POA: Diagnosis not present

## 2024-03-25 ENCOUNTER — Encounter: Payer: Self-pay | Admitting: Emergency Medicine

## 2024-03-25 ENCOUNTER — Emergency Department
Admission: EM | Admit: 2024-03-25 | Discharge: 2024-03-25 | Disposition: A | Discharge status: Home / Self Care | Attending: Emergency Medicine | Admitting: Emergency Medicine

## 2024-03-25 ENCOUNTER — Emergency Department

## 2024-03-25 ENCOUNTER — Other Ambulatory Visit: Payer: Self-pay

## 2024-03-25 DIAGNOSIS — N132 Hydronephrosis with renal and ureteral calculous obstruction: Secondary | ICD-10-CM | POA: Insufficient documentation

## 2024-03-25 DIAGNOSIS — I10 Essential (primary) hypertension: Secondary | ICD-10-CM | POA: Insufficient documentation

## 2024-03-25 DIAGNOSIS — N23 Unspecified renal colic: Secondary | ICD-10-CM

## 2024-03-25 DIAGNOSIS — N281 Cyst of kidney, acquired: Secondary | ICD-10-CM | POA: Diagnosis not present

## 2024-03-25 DIAGNOSIS — N200 Calculus of kidney: Secondary | ICD-10-CM | POA: Diagnosis not present

## 2024-03-25 DIAGNOSIS — R10A2 Flank pain, left side: Secondary | ICD-10-CM | POA: Diagnosis present

## 2024-03-25 DIAGNOSIS — K5730 Diverticulosis of large intestine without perforation or abscess without bleeding: Secondary | ICD-10-CM | POA: Diagnosis not present

## 2024-03-25 DIAGNOSIS — N201 Calculus of ureter: Secondary | ICD-10-CM

## 2024-03-25 LAB — COMPREHENSIVE METABOLIC PANEL WITH GFR
ALT: 23 U/L (ref 0–44)
AST: 28 U/L (ref 15–41)
Albumin: 4.4 g/dL (ref 3.5–5.0)
Alkaline Phosphatase: 44 U/L (ref 38–126)
Anion gap: 11 (ref 5–15)
BUN: 19 mg/dL (ref 8–23)
CO2: 23 mmol/L (ref 22–32)
Calcium: 9.8 mg/dL (ref 8.9–10.3)
Chloride: 105 mmol/L (ref 98–111)
Creatinine, Ser: 1.27 mg/dL — ABNORMAL HIGH (ref 0.61–1.24)
GFR, Estimated: 58 mL/min — ABNORMAL LOW (ref >60)
Glucose, Bld: 140 mg/dL — ABNORMAL HIGH (ref 70–99)
Potassium: 3.9 mmol/L (ref 3.5–5.1)
Sodium: 139 mmol/L (ref 135–145)
Total Bilirubin: 0.8 mg/dL (ref 0.0–1.2)
Total Protein: 8 g/dL (ref 6.5–8.1)

## 2024-03-25 LAB — CBC WITH DIFFERENTIAL/PLATELET
Abs Immature Granulocytes: 0.01 K/uL (ref 0.00–0.07)
Basophils Absolute: 0.1 K/uL (ref 0.0–0.1)
Basophils Relative: 1 %
Eosinophils Absolute: 0.1 K/uL (ref 0.0–0.5)
Eosinophils Relative: 1 %
HCT: 45.3 % (ref 39.0–52.0)
Hemoglobin: 15 g/dL (ref 13.0–17.0)
Immature Granulocytes: 0 %
Lymphocytes Relative: 41 %
Lymphs Abs: 3.5 K/uL (ref 0.7–4.0)
MCH: 30.6 pg (ref 26.0–34.0)
MCHC: 33.1 g/dL (ref 30.0–36.0)
MCV: 92.4 fL (ref 80.0–100.0)
Monocytes Absolute: 0.9 K/uL (ref 0.1–1.0)
Monocytes Relative: 11 %
Neutro Abs: 3.9 K/uL (ref 1.7–7.7)
Neutrophils Relative %: 46 %
Platelets: 187 K/uL (ref 150–400)
RBC: 4.9 MIL/uL (ref 4.22–5.81)
RDW: 13.8 % (ref 11.5–15.5)
WBC: 8.5 K/uL (ref 4.0–10.5)
nRBC: 0 % (ref 0.0–0.2)

## 2024-03-25 LAB — URINALYSIS, ROUTINE W REFLEX MICROSCOPIC
Bacteria, UA: NONE SEEN
Bilirubin Urine: NEGATIVE
Glucose, UA: NEGATIVE mg/dL
Ketones, ur: NEGATIVE mg/dL
Leukocytes,Ua: NEGATIVE
Nitrite: NEGATIVE
Protein, ur: NEGATIVE mg/dL
Specific Gravity, Urine: 1.019 (ref 1.005–1.030)
Squamous Epithelial / HPF: 0 /HPF (ref 0–5)
WBC, UA: 0 WBC/hpf (ref 0–5)
pH: 5 (ref 5.0–8.0)

## 2024-03-25 MED ORDER — TAMSULOSIN HCL 0.4 MG PO CAPS: 0.4000 mg | ORAL_CAPSULE | Freq: Every day | ORAL | 0 refills | Status: AC

## 2024-03-25 MED ORDER — OXYCODONE-ACETAMINOPHEN 5-325 MG PO TABS
1.0000 | ORAL_TABLET | ORAL | Status: DC | PRN
  Administered 2024-03-25: 1 via ORAL
  Filled 2024-03-25 (×2): qty 1

## 2024-03-25 MED ORDER — OXYCODONE HCL 5 MG PO TABS: 5.0000 mg | ORAL_TABLET | Freq: Three times a day (TID) | ORAL | 0 refills | Status: AC | PRN

## 2024-03-25 MED ORDER — ACETAMINOPHEN 325 MG PO TABS
650.0000 mg | ORAL_TABLET | Freq: Once | ORAL | Status: AC
  Administered 2024-03-25: 650 mg via ORAL
  Filled 2024-03-25: qty 2

## 2024-03-25 MED ORDER — KETOROLAC TROMETHAMINE 30 MG/ML IJ SOLN
30.0000 mg | Freq: Once | INTRAMUSCULAR | Status: AC
  Administered 2024-03-25: 30 mg via INTRAMUSCULAR
  Filled 2024-03-25: qty 1

## 2024-03-25 NOTE — ED Provider Notes (Signed)
 [facility] Provider Note    Event Date/Time   First MD Initiated Contact with Patient 03/25/24 [phone]     (approximate)   History   Flank Pain   HPI  Jonathan Greer is a 78 y.o. male   Past medical history of prior kidney stones, hypertension, hyperlipidemia, who comes in with left flank pain consistent with his renal colic and kidney stone pains he has had in the past.  Started yesterday.  No injuries or falls.  Radiates to the front groin area.  No urinary symptoms.  No abdominal pain.   External Medical Documents Reviewed: Prior outpatient notes      Physical Exam   Triage Vital Signs: ED Triage Vitals  Encounter Vitals Group     BP 03/25/24 0117 (!) 183/100     Girls Systolic BP Percentile --      Girls Diastolic BP Percentile --      Boys Systolic BP Percentile --      Boys Diastolic BP Percentile --      Pulse Rate 03/25/24 0117 68     Resp --      Temp 03/25/24 0117 97.8 F (36.6 C)     Temp Source 03/25/24 0117 Oral     SpO2 03/25/24 0117 96 %     Weight 03/25/24 0113 245 lb (111.1 kg)     Height 03/25/24 0113 5' 8 (1.727 m)     Head Circumference --      Peak Flow --      Pain Score 03/25/24 0113 7     Pain Loc --      Pain Education --      Exclude from Growth Chart --     Most recent vital signs: Vitals:   03/25/24 0117  BP: (!) 183/100  Pulse: 68  Temp: 97.8 F (36.6 C)  SpO2: 96%    General: Awake, no distress.  CV:  Good peripheral perfusion.  Resp:  Normal effort.  Abd:  No distention.  Other:  Mild left CVA tenderness with a benign abdominal exam.  Slightly hypertensive otherwise vital signs normal.   ED Results / Procedures / Treatments   Labs (all labs ordered are listed, but only abnormal results are displayed) Labs Reviewed  COMPREHENSIVE METABOLIC PANEL WITH GFR - Abnormal; Notable for the following components:      Result Value   Glucose, Bld 140 (*)    Creatinine, Ser 1.27 (*)     GFR, Estimated 58 (*)    All other components within normal limits  URINALYSIS, ROUTINE W REFLEX MICROSCOPIC - Abnormal; Notable for the following components:   Color, Urine YELLOW (*)    APPearance CLEAR (*)    Hgb urine dipstick MODERATE (*)    All other components within normal limits  CBC WITH DIFFERENTIAL/PLATELET     I ordered and reviewed the above labs they are notable for urine does not appear infected, cell counts unremarkable.   RADIOLOGY I independently reviewed and interpreted CT scan of the abdomen pelvis see no obvious obstructive or inflammatory changes. I also reviewed radiologist's formal read.   PROCEDURES:  Critical Care performed: No  Procedures   MEDICATIONS ORDERED IN ED: Medications  oxyCODONE -acetaminophen  (PERCOCET/ROXICET) 5-325 MG per tablet 1 tablet (1 tablet Oral Given 03/25/24 0326)  ketorolac  (TORADOL ) 30 MG/ML injection 30 mg (has no administration in time range)  acetaminophen  (TYLENOL ) tablet 650 mg (has no administration in time range)     IMPRESSION /  MDM / ASSESSMENT AND PLAN / ED COURSE  I reviewed the triage vital signs and the nursing notes.                                Patient's presentation is most consistent with acute presentation with potential threat to life or bodily function.  Differential diagnosis includes, but is not limited to, renal colic, urine infection/pyelonephritis, considered but less likely vascular emergencies   The patient is on the cardiac monitor to evaluate for evidence of arrhythmia and/or significant heart rate changes.  MDM:    History of kidney stone with symptoms consistent with prior, renal colic like symptoms, with a diagnosed 2 mm proximal stone the left side corresponding to his symptoms.  No other acute abnormalities noted on scan.  No urine infection.  Pain moderately well-controlled with his Percocet given in triage.  Will prescribe Flomax .  Prescribed narcotic pain medication to use for  severe/breakthrough pain as needed.  Have him follow-up with urology.    I considered hospitalization for admission or observation however given pain well-controlled in the emergency department no other emergency conditions or concomitant urine infection noted in this patient, I think outpatient follow-up appropriate at this time        FINAL CLINICAL IMPRESSION(S) / ED DIAGNOSES   Final diagnoses:  Ureterolithiasis  Renal colic on left side     Rx / DC Orders   ED Discharge Orders          Ordered    oxyCODONE  (ROXICODONE ) 5 MG immediate release tablet  Every 8 hours PRN        03/25/24 0408    tamsulosin  (FLOMAX ) 0.4 MG CAPS capsule  Daily        03/25/24 0408             Note:  This document was prepared using Dragon voice recognition software and may include unintentional dictation errors.    Cyrena Mylar, MD 03/25/24 [phone]

## 2024-03-25 NOTE — ED Triage Notes (Addendum)
 Patient ambulatory to triage with steady gait, without difficulty or distress noted; pt reports left flank/side pain since yesterday with no accomp symptoms; st hx kidney stones in past with similar symptoms; no meds taken PTA

## 2024-03-25 NOTE — ED Notes (Signed)
Patient given discharge instructions including prescriptions x2 and importance of follow up appt as needed with stated understanding. Patient stable and ambulatory with steady even gait on dispo.

## 2024-03-25 NOTE — Discharge Instructions (Addendum)
 Use a strainer when you urinate and if you catch a stone, put in the specimen cup to bring to your urologist as they may be able to do further testing of the stone to tailor treatment plans for you.  Take acetaminophen  650 mg and ibuprofen  400 mg every 6 hours for pain.  Take with food. Use Flomax  daily as prescribed.  Take the oxycodone  only for severe pain.  Thank you for choosing us  for your health care today!  Please see your primary doctor this week for a follow up appointment.   If you have any new, worsening, or unexpected symptoms call your doctor right away or come back to the emergency department for reevaluation.  It was my pleasure to care for you today.   Ginnie EDISON Cyrena, MD

## 2024-03-27 ENCOUNTER — Emergency Department
Admission: EM | Admit: 2024-03-27 | Discharge: 2024-03-27 | Disposition: A | Discharge status: Home / Self Care | Attending: Emergency Medicine | Admitting: Emergency Medicine

## 2024-03-27 ENCOUNTER — Other Ambulatory Visit: Payer: Self-pay

## 2024-03-27 DIAGNOSIS — I10 Essential (primary) hypertension: Secondary | ICD-10-CM | POA: Diagnosis not present

## 2024-03-27 DIAGNOSIS — N1330 Unspecified hydronephrosis: Secondary | ICD-10-CM | POA: Diagnosis not present

## 2024-03-27 DIAGNOSIS — N201 Calculus of ureter: Secondary | ICD-10-CM | POA: Insufficient documentation

## 2024-03-27 DIAGNOSIS — D72819 Decreased white blood cell count, unspecified: Secondary | ICD-10-CM | POA: Insufficient documentation

## 2024-03-27 DIAGNOSIS — R109 Unspecified abdominal pain: Secondary | ICD-10-CM | POA: Diagnosis present

## 2024-03-27 LAB — URINALYSIS, ROUTINE W REFLEX MICROSCOPIC
Bacteria, UA: NONE SEEN
Bilirubin Urine: NEGATIVE
Glucose, UA: 50 mg/dL — AB
Ketones, ur: NEGATIVE mg/dL
Nitrite: NEGATIVE
Protein, ur: NEGATIVE mg/dL
RBC / HPF: >50 RBC/hpf (ref 0–5)
Specific Gravity, Urine: 1.015 (ref 1.005–1.030)
pH: 5 (ref 5.0–8.0)

## 2024-03-27 LAB — BASIC METABOLIC PANEL WITH GFR
Anion gap: 13 (ref 5–15)
BUN: 26 mg/dL — ABNORMAL HIGH (ref 8–23)
CO2: 22 mmol/L (ref 22–32)
Calcium: 9.3 mg/dL (ref 8.9–10.3)
Chloride: 104 mmol/L (ref 98–111)
Creatinine, Ser: 1.79 mg/dL — ABNORMAL HIGH (ref 0.61–1.24)
GFR, Estimated: 38 mL/min — ABNORMAL LOW (ref >60)
Glucose, Bld: 167 mg/dL — ABNORMAL HIGH (ref 70–99)
Potassium: 3.6 mmol/L (ref 3.5–5.1)
Sodium: 139 mmol/L (ref 135–145)

## 2024-03-27 LAB — CBC
HCT: 43.5 % (ref 39.0–52.0)
Hemoglobin: 14.8 g/dL (ref 13.0–17.0)
MCH: 31.1 pg (ref 26.0–34.0)
MCHC: 34 g/dL (ref 30.0–36.0)
MCV: 91.4 fL (ref 80.0–100.0)
Platelets: 157 K/uL (ref 150–400)
RBC: 4.76 MIL/uL (ref 4.22–5.81)
RDW: 13.8 % (ref 11.5–15.5)
WBC: 7.5 K/uL (ref 4.0–10.5)
nRBC: 0 % (ref 0.0–0.2)

## 2024-03-27 MED ORDER — POLYETHYLENE GLYCOL 3350 17 G PO PACK: 17.0000 g | PACK | Freq: Every day | ORAL | 0 refills | Status: AC

## 2024-03-27 MED ORDER — SODIUM CHLORIDE 0.9 % IV BOLUS
1000.0000 mL | Freq: Once | INTRAVENOUS | Status: AC
  Administered 2024-03-27: 1000 mL via INTRAVENOUS

## 2024-03-27 NOTE — ED Triage Notes (Signed)
 Pt to ED for dx kidney stone, left flank pain since Tuesday. Seen recently for same. Reports no relief.

## 2024-03-27 NOTE — Discharge Instructions (Addendum)
 Your lab work was reassuring today.  Your kidney function had decreased just a little bit which is why you were given IV fluids.  Make sure you drink lots of water at home as this will help you pass the stone.  Please continue to use the urine strainer so we will know when you pass the stone.  You can take 650 mg of Tylenol  and 600 mg of ibuprofen  every 6 hours as needed for pain. You can use ice, heat, muscle creams and other topical pain relievers as well.  If you are unable to control your pain using these medications take the oxycodone .  This can be taken every 8 hours.  I have sent some MiraLAX to the pharmacy.  Please take this daily.  I also encourage you to incorporate more fiber in your diet which comes from fruits, veggies and whole grains.

## 2024-03-27 NOTE — ED Notes (Signed)
 Unsuccessful phlebotomy attempt x2, lab called

## 2024-03-27 NOTE — ED Triage Notes (Addendum)
 Arrives from [facility]. SEen through ED on 10/8, diagnosed with kidney stone.  No blood in urine. Left flank pain not improving.  Stating pain 6/10.  AAOx3. Skin warm and dry. NAD. Posture upright and relaxed.

## 2024-03-27 NOTE — Progress Notes (Signed)
 Patient presents to triage with complaints of worsening pain and discomfort due to left ureteral stone.  Seen in ED 2 days prior, treated with oxycodone , not giving relief.  Return to ED for further evaluation and care.

## 2024-03-27 NOTE — ED Provider Notes (Signed)
 [facility] Provider Note    Event Date/Time   First MD Initiated Contact with Patient 03/27/24 1044     (approximate)   History   Flank Pain   HPI  Jonathan Greer is a 78 y.o. male with PMH of kidney stones, prediabetes, renal cysts, high blood pressure presents for evaluation of ongoing flank pain.  Patient was seen in the emergency department on 10 8, diagnosed with a kidney stone and given pain medication to take at home.  Patient reports that he is still having pain and does not think that he has passed the stone yet.  Pain starts in the left flank and radiates to the front of the abdomen.      Physical Exam   Triage Vital Signs: ED Triage Vitals [03/27/24 0955]  Encounter Vitals Group     BP (!) 141/86     Girls Systolic BP Percentile      Girls Diastolic BP Percentile      Boys Systolic BP Percentile      Boys Diastolic BP Percentile      Pulse Rate (!) 59     Resp 16     Temp 98.6 F (37 C)     Temp src      SpO2 96 %     Weight 250 lb (113.4 kg)     Height 5' 10 (1.778 m)     Head Circumference      Peak Flow      Pain Score 5     Pain Loc      Pain Education      Exclude from Growth Chart     Most recent vital signs: Vitals:   03/27/24 0955  BP: (!) 141/86  Pulse: (!) 59  Resp: 16  Temp: 98.6 F (37 C)  SpO2: 96%   General: Awake, no distress.  CV:  Good peripheral perfusion.  RRR. Resp:  Normal effort.  CTAB. Abd:  No distention.  Soft, nontender to palpation. Other:     ED Results / Procedures / Treatments   Labs (all labs ordered are listed, but only abnormal results are displayed) Labs Reviewed  BASIC METABOLIC PANEL WITH GFR - Abnormal; Notable for the following components:      Result Value   Glucose, Bld 167 (*)    BUN 26 (*)    Creatinine, Ser 1.79 (*)    GFR, Estimated 38 (*)    All other components within normal limits  URINALYSIS, ROUTINE W REFLEX MICROSCOPIC - Abnormal; Notable for the  following components:   Color, Urine YELLOW (*)    APPearance HAZY (*)    Glucose, UA 50 (*)    Hgb urine dipstick LARGE (*)    Leukocytes,Ua SMALL (*)    All other components within normal limits  CBC    PROCEDURES:  Critical Care performed: No  Procedures   MEDICATIONS ORDERED IN ED: Medications  sodium chloride  0.9 % bolus 1,000 mL (0 mLs Intravenous Stopped 03/27/24 1302)     IMPRESSION / MDM / ASSESSMENT AND PLAN / ED COURSE  I reviewed the triage vital signs and the nursing notes.                             78 year old male presents for evaluation of flank pain.  Blood pressure is elevated heart rate is a little low, vital signs stable otherwise.  Differential diagnosis includes, but  is not limited to, pain management, kidney stone, AKI, electrolyte abnormality.  Patient's presentation is most consistent with acute complicated illness / injury requiring diagnostic workup.  CBC is unremarkable.  BMP shows elevated glucose, BUN and creatinine.  When compared with labs from 2 days ago kidney function has slightly worsened.  1.27-->1.79 creatinine. Will give patient some IV fluids.  Discussed with patient how he has been managing his pain at home and he states he has only been taking ibuprofen  as he is concerned about development of dependency by taking a oxycodone .  I reviewed appropriate pain medication dosing and feel that patient has a better understanding of how and when to take his medications.  Did encourage him to drink lots of water to help the stone pass.  The CT scan from 2 days ago showed that the stone was 2 mm and was in the proximal left ureter.  I explained to the patient that it may take a little bit longer to pass as it was starting higher up but given the size should pass on its own.  Do not feel that further CT imaging would provide new information and we will hold off on that for now.  Will obtain urinalysis, if there are no signs of infection will plan to  discharge patient.  Clinical Course as of 03/27/24 1334  Fri Mar 27, 2024  1211 Urinalysis, Routine w reflex microscopic -Urine, Clean Catch(!) Small leukocytes, RBCs, WBCs and no bacteria. Do not feel this is consistent with UTI.  [LD]  1245 Reviewed discharge instructions with the patient we will plan to discharge when he is completed half a bag of his IV fluids. [LD]    Clinical Course User Index [LD] Cleaster Tinnie LABOR, PA-C     FINAL CLINICAL IMPRESSION(S) / ED DIAGNOSES   Final diagnoses:  Ureterolithiasis     Rx / DC Orders   ED Discharge Orders          Ordered    polyethylene glycol (MIRALAX) 17 g packet  Daily        03/27/24 1215             Note:  This document was prepared using Dragon voice recognition software and may include unintentional dictation errors.   Cleaster Tinnie LABOR, PA-C 03/27/24 1334    Arlander Charleston, MD 03/27/24 1345

## 2024-03-31 ENCOUNTER — Encounter: Payer: Self-pay | Admitting: Emergency Medicine

## 2024-03-31 ENCOUNTER — Emergency Department
Admission: EM | Admit: 2024-03-31 | Discharge: 2024-03-31 | Disposition: A | Discharge status: Home / Self Care | Attending: Emergency Medicine | Admitting: Emergency Medicine

## 2024-03-31 ENCOUNTER — Emergency Department

## 2024-03-31 ENCOUNTER — Other Ambulatory Visit: Payer: Self-pay

## 2024-03-31 DIAGNOSIS — N132 Hydronephrosis with renal and ureteral calculous obstruction: Secondary | ICD-10-CM | POA: Insufficient documentation

## 2024-03-31 DIAGNOSIS — N281 Cyst of kidney, acquired: Secondary | ICD-10-CM | POA: Diagnosis not present

## 2024-03-31 DIAGNOSIS — N201 Calculus of ureter: Secondary | ICD-10-CM

## 2024-03-31 DIAGNOSIS — K5730 Diverticulosis of large intestine without perforation or abscess without bleeding: Secondary | ICD-10-CM | POA: Diagnosis not present

## 2024-03-31 DIAGNOSIS — N200 Calculus of kidney: Secondary | ICD-10-CM

## 2024-03-31 DIAGNOSIS — Z85828 Personal history of other malignant neoplasm of skin: Secondary | ICD-10-CM | POA: Insufficient documentation

## 2024-03-31 DIAGNOSIS — N134 Hydroureter: Secondary | ICD-10-CM | POA: Diagnosis not present

## 2024-03-31 DIAGNOSIS — R10A2 Flank pain, left side: Secondary | ICD-10-CM | POA: Diagnosis present

## 2024-03-31 LAB — URINALYSIS, ROUTINE W REFLEX MICROSCOPIC
Bilirubin Urine: NEGATIVE
Glucose, UA: NEGATIVE mg/dL
Hgb urine dipstick: NEGATIVE
Ketones, ur: NEGATIVE mg/dL
Leukocytes,Ua: NEGATIVE
Nitrite: NEGATIVE
Protein, ur: NEGATIVE mg/dL
Specific Gravity, Urine: 1.017 (ref 1.005–1.030)
pH: 5 (ref 5.0–8.0)

## 2024-03-31 LAB — BASIC METABOLIC PANEL WITH GFR
Anion gap: 10 (ref 5–15)
BUN: 21 mg/dL (ref 8–23)
CO2: 24 mmol/L (ref 22–32)
Calcium: 9.4 mg/dL (ref 8.9–10.3)
Chloride: 104 mmol/L (ref 98–111)
Creatinine, Ser: 1.46 mg/dL — ABNORMAL HIGH (ref 0.61–1.24)
GFR, Estimated: 49 mL/min — ABNORMAL LOW (ref >60)
Glucose, Bld: 115 mg/dL — ABNORMAL HIGH (ref 70–99)
Potassium: 4.4 mmol/L (ref 3.5–5.1)
Sodium: 138 mmol/L (ref 135–145)

## 2024-03-31 LAB — CBC
HCT: 43.8 % (ref 39.0–52.0)
Hemoglobin: 15 g/dL (ref 13.0–17.0)
MCH: 31.1 pg (ref 26.0–34.0)
MCHC: 34.2 g/dL (ref 30.0–36.0)
MCV: 90.9 fL (ref 80.0–100.0)
Platelets: 197 K/uL (ref 150–400)
RBC: 4.82 MIL/uL (ref 4.22–5.81)
RDW: 13.3 % (ref 11.5–15.5)
WBC: 9.1 K/uL (ref 4.0–10.5)
nRBC: 0 % (ref 0.0–0.2)

## 2024-03-31 NOTE — Consult Note (Signed)
 Urology Consult   I have been asked to see the patient by Dr. Floy, for evaluation and management of nephrolithiasis.  Chief Complaint: left flank pain  HPI:  Jonathan Greer is a 78 y.o. year old  ED visit today 03/31/2024 -sub-acute left flank pain x 2 weeks This is his third ED visit for similar complaint CT stone (03/31/2024)-2 small distal left ureteral stones (2 mm and 4 mm) with mild obstructive hydroureteronephrosis UA negative Patient has been taking intermittent oxycodone , no regular Tylenol  or NSAIDs  On my exam patient was sitting at the bedside, minimal discomfort He had not received any pain medications during his ED visit  PMH: Past Medical History:  Diagnosis Date   Arthritis of left knee    Basal cell carcinoma    nose   Blood pressure elevated without history of HTN    Colon polyps    Complex renal cyst    Diverticulosis of large intestine without diverticulitis    Hemorrhoids    Hepatic steatosis    History of colonic polyps    History of kidney stones    Hyperlipemia 07/07/2015   Hyperlipidemia    Morbid obesity (HCC)    Pre-diabetes    Shingles 07/07/2015   Varicose vein of leg    Wears dentures     Surgical History: Past Surgical History:  Procedure Laterality Date   CATARACT EXTRACTION W/PHACO Left 03/13/2017   Procedure: CATARACT EXTRACTION PHACO AND INTRAOCULAR LENS PLACEMENT (IOC) LEFT;  Surgeon: Mittie Gaskin, MD;  Location: [facility];  Service: Ophthalmology;  Laterality: Left;  IVA TOPICAL LEFT   CATARACT EXTRACTION W/PHACO Right 04/17/2017   Procedure: CATARACT EXTRACTION PHACO AND INTRAOCULAR LENS PLACEMENT (IOC);  Surgeon: Mittie Gaskin, MD;  Location: [facility];  Service: Ophthalmology;  Laterality: Right;  IVA TOPICAL RIGHT   COLONOSCOPY WITH PROPOFOL  N/A 07/27/2016   Procedure: COLONOSCOPY WITH PROPOFOL ;  Surgeon: Ruel Kung, MD;  Location: [facility];  Service: Endoscopy;   Laterality: N/A;   COLONOSCOPY WITH PROPOFOL  N/A 05/02/2020   Procedure: COLONOSCOPY WITH PROPOFOL ;  Surgeon: Kung Ruel, MD;  Location: [facility];  Service: Gastroenterology;  Laterality: N/A;   COLONOSCOPY WITH PROPOFOL  N/A 04/17/2022   Procedure: COLONOSCOPY WITH PROPOFOL ;  Surgeon: Kung Ruel, MD;  Location: [facility];  Service: Gastroenterology;  Laterality: N/A;   HERNIA REPAIR     KNEE ARTHROPLASTY Left 05/15/2023   Procedure: COMPUTER ASSISTED TOTAL KNEE ARTHROPLASTY;  Surgeon: Mardee Lynwood SQUIBB, MD;  Location: [facility];  Service: Orthopedics;  Laterality: Left;   MOHS SURGERY  01/10/2016   [facility] for [facility]   TONSILLECTOMY AND ADENOIDECTOMY  02/17/1959   Allergies:  Allergies  Allergen Reactions   Atorvastatin      Hip and knee pain. Tolerates in low doses   Pravastatin      Hip pain   Family History: Family History  Problem Relation Age of Onset   Heart failure Mother    CVA Father    Lung cancer Brother    Throat cancer Brother    Liver cancer Maternal Grandmother    Cancer Paternal Grandmother    Cancer Paternal Grandfather    Lung cancer Brother    Bladder Cancer Neg Hx    Prostate cancer Neg Hx    Kidney cancer Neg Hx     Social History:  reports that he quit smoking about 48 years ago. His smoking use included cigarettes. He started smoking about 68 years ago. He has a 40 pack-year  smoking history. He has quit using smokeless tobacco.  His smokeless tobacco use included chew. He reports that he does not drink alcohol and does not use drugs.  ROS: Negative aside from those stated in the HPI.  Physical Exam: BP (!) 140/88 (BP Location: Left Arm)   Pulse 70   Temp 98 F (36.7 C)   Resp 16   Ht 5' 10 (1.778 m)   Wt 113 kg   SpO2 100%   BMI 35.74 kg/m    Constitutional:  Alert and oriented, No acute distress. Cardiovascular: No clubbing, cyanosis, or edema. Respiratory: Normal respiratory effort, no increased work of breathing. GI: Abdomen is  soft, nontender, nondistended, no abdominal masses Lymph: No cervical or inguinal lymphadenopathy. Skin: No rashes, bruises or suspicious lesions. Neurologic: Grossly intact, no focal deficits, moving all 4 extremities. Psychiatric: Normal mood and affect.  Laboratory Data: UA negative Creatinine 1.46 Normal WBC  Pertinent Imaging: I have personally reviewed the CT stone 03/31/2024 -2 small distal left ureteral stones (4 mm and 2 mm) with obstructive mild hydroureteronephrosis.  Otherwise morphologically normal bilateral kidneys, no renal nephrolithiasis  Assessment & Plan:   Obstructive left distal ureteral stones (4 mm and 2 mm)  Ongoing pain x 2 weeks, third ED bounce back  Recommendations: -I offered him ureteral stent placement this afternoon (as well as semirigid ureteroscopy see if we could render him stone free, as he has negative UA).  He declined-preferred to continue MET - I also think this is reasonable-he has a higher probability of spontaneous stone passage with this size and location - Emphasized scheduled high-dose NSAIDs, Tylenol , Flomax , oxycodone  for breakthrough discomfort - Use of urine strainer with each void - He may see me in clinic in 1-2 weeks.  Will discuss surgery again if he is not passed his stones by then  Penne JONELLE Skye, MD  [facility] [address] [place], [place] [address] [phone]

## 2024-03-31 NOTE — ED Triage Notes (Signed)
 Patient to ED via POV for left sided flank pain. Ongoing x1 week- dx with kidney stone. States has soreness/ache on the left side. Denies urinary symptoms. States he took an OxyContin  at 8am.

## 2024-03-31 NOTE — ED Notes (Signed)
 See triage note  Presents with left flank pain  Recently dx'd with a renal stones  States then pain returned during the night   Not been able to sleep  States he did take a pain pill this am  States pain has eased off

## 2024-03-31 NOTE — ED Notes (Signed)
Resting at present  Family at bedside   

## 2024-03-31 NOTE — ED Provider Notes (Signed)
 [facility] Provider Note    Event Date/Time   First MD Initiated Contact with Patient 03/31/24 [phone]     (approximate)   History   Flank Pain   HPI  Jonathan Greer is a 78 y.o. male  who presents to the emergency department today because of concern for continued left flank pain. Symptoms have been present for roughly 2 weeks. Has been seen in the ED with CT scan showing 2mm left ureteral stone. He has been taking his nausea and pain medication at home with minimal relief. Had difficulty sleeping last night due to the pain. Denies any fevers or chills.        Physical Exam   Triage Vital Signs: ED Triage Vitals  Encounter Vitals Group     BP 03/31/24 0911 (!) 143/92     Girls Systolic BP Percentile --      Girls Diastolic BP Percentile --      Boys Systolic BP Percentile --      Boys Diastolic BP Percentile --      Pulse Rate 03/31/24 0911 74     Resp 03/31/24 0911 17     Temp 03/31/24 0911 98.1 F (36.7 C)     Temp Source 03/31/24 0911 Oral     SpO2 03/31/24 0911 100 %     Weight 03/31/24 0912 249 lb 1.9 oz (113 kg)     Height 03/31/24 0912 5' 10 (1.778 m)     Head Circumference --      Peak Flow --      Pain Score 03/31/24 0911 8     Pain Loc --      Pain Education --      Exclude from Growth Chart --     Most recent vital signs: Vitals:   03/31/24 0911  BP: (!) 143/92  Pulse: 74  Resp: 17  Temp: 98.1 F (36.7 C)  SpO2: 100%   General: Awake, alert, oriented. CV:  Good peripheral perfusion. Regular rate and rhythm. Resp:  Normal effort. Lungs clear. Abd:  No distention. Minimally tender in left lower quadrant.   ED Results / Procedures / Treatments   Labs (all labs ordered are listed, but only abnormal results are displayed) Labs Reviewed  URINALYSIS, ROUTINE W REFLEX MICROSCOPIC - Abnormal; Notable for the following components:      Result Value   Color, Urine YELLOW (*)    APPearance CLEAR (*)    All other  components within normal limits  BASIC METABOLIC PANEL WITH GFR - Abnormal; Notable for the following components:   Glucose, Bld 115 (*)    Creatinine, Ser 1.46 (*)    GFR, Estimated 49 (*)    All other components within normal limits  CBC     EKG  None   RADIOLOGY I independently interpreted and visualized the CT renal stone. My interpretation: left lower ureteral stone Radiology interpretation:  IMPRESSION:  1. 4 mm distal left ureteral calculus causing moderate left  hydronephrosis and mild left hydroureter.  2. Additional 2 mm distal left ureteral calculus as described above.  3. Tiny nonobstructing right renal calculus.  4. Bilateral renal parapelvic cysts.  5. Colonic diverticulosis.     PROCEDURES:  Critical Care performed: No   MEDICATIONS ORDERED IN ED: Medications - No data to display   IMPRESSION / MDM / ASSESSMENT AND PLAN / ED COURSE  I reviewed the triage vital signs and the nursing notes.  Differential diagnosis includes, but is not limited to, kidney stone, UTI, diverticulitis  Patient's presentation is most consistent with acute presentation with potential threat to life or bodily function.   Patient presented to the emergency department today because of concern for continued left sided abdominal pain with known kidney stone. Repeat CT today does show the kidney stone has moved more distally in the ureter. No evidence of UTI at this time. Spoke with Dr. Georganne with urology. Offered stenting however patient declined at this time. Will plan on discharging with continued conservative management.       FINAL CLINICAL IMPRESSION(S) / ED DIAGNOSES   Final diagnoses:  Kidney stone      Note:  This document was prepared using Dragon voice recognition software and may include unintentional dictation errors.    Floy Roberts, MD 03/31/24 [phone]

## 2024-04-06 DIAGNOSIS — M1712 Unilateral primary osteoarthritis, left knee: Secondary | ICD-10-CM | POA: Diagnosis not present

## 2024-04-06 DIAGNOSIS — W010XXA Fall on same level from slipping, tripping and stumbling without subsequent striking against object, initial encounter: Secondary | ICD-10-CM | POA: Diagnosis not present

## 2024-04-06 DIAGNOSIS — Z96652 Presence of left artificial knee joint: Secondary | ICD-10-CM | POA: Diagnosis not present

## 2024-05-18 ENCOUNTER — Ambulatory Visit
Admission: RE | Admit: 2024-05-18 | Discharge: 2024-05-18 | Disposition: A | Attending: Family Medicine | Admitting: Family Medicine

## 2024-05-18 ENCOUNTER — Ambulatory Visit
Admission: RE | Admit: 2024-05-18 | Discharge: 2024-05-18 | Disposition: A | Source: Ambulatory Visit | Attending: Family Medicine | Admitting: Family Medicine

## 2024-05-18 ENCOUNTER — Ambulatory Visit: Admitting: Family Medicine

## 2024-05-18 ENCOUNTER — Encounter: Payer: Self-pay | Admitting: Family Medicine

## 2024-05-18 VITALS — BP 132/70 | HR 54 | Resp 16 | Ht 70.0 in | Wt 247.4 lb

## 2024-05-18 DIAGNOSIS — M545 Low back pain, unspecified: Secondary | ICD-10-CM | POA: Insufficient documentation

## 2024-05-18 DIAGNOSIS — R7303 Prediabetes: Secondary | ICD-10-CM

## 2024-05-18 LAB — POCT GLYCOSYLATED HEMOGLOBIN (HGB A1C)
Est. average glucose Bld gHb Est-mCnc: 131
Hemoglobin A1C: 6.2 % — AB (ref 4.0–5.6)

## 2024-05-18 NOTE — Patient Instructions (Signed)
 Jonathan Greer  Please review the attached list of medications and notify my office if there are any errors.   . Please bring all of your medications to every appointment so we can make sure that our medication list is the same as yours.

## 2024-05-18 NOTE — Progress Notes (Signed)
 Established patient visit   Patient: Jonathan Greer   DOB: [birth year]   78 y.o. Male  MRN: [id number] Visit Date: 05/18/2024  Today's healthcare provider: Nancyann Perry, MD   Chief Complaint  Patient presents with   Medical Management of Chronic Issues   Flu Vaccine    Declined   Subjective    Discussed the use of AI scribe software for clinical note transcription with the patient, who gave verbal consent to proceed.  History of Present Illness   Jonathan Greer is a 78 year old male who presents for follow-up on prediabetes.  His last A1c in July was 6.5%, and today it is 6.2%. He finds it challenging to maintain a strict diet since retirement but plans to start a strict diet with his wife after Christmas.  He experienced a fall on Tuesday, resulting in soreness on the right side of his back. The pain has improved but is still present, and he is concerned about a possible fracture. He wants an x-ray to rule out any serious injury.  He recalls a previous incident where a fall dislodged a kidney stone, causing significant pain for two weeks until it passed. He visited the emergency room three times during that period but did not receive any pain relief beyond ibuprofen .     Lab Results  Component Value Date   HGBA1C 6.2 (A) 05/18/2024   HGBA1C 6.5 (H) 01/10/2024   HGBA1C 6.3 (H) 07/12/2023   Lab Results  Component Value Date   NA 138 03/31/2024   K 4.4 03/31/2024   CREATININE 1.46 (H) 03/31/2024   GFRNONAA 49 (L) 03/31/2024   GLUCOSE 115 (H) 03/31/2024   Lab Results  Component Value Date   CHOL 153 01/10/2024   HDL 57 01/10/2024   LDLCALC 84 01/10/2024   TRIG 59 01/10/2024   CHOLHDL 2.7 01/10/2024     Medications: Outpatient Medications Prior to Visit  Medication Sig   atorvastatin  (LIPITOR) 20 MG tablet TAKE ONE TABLET THREE DAYS A WEEK   celecoxib  (CELEBREX ) 200 MG capsule Take 1 capsule (200 mg total) by mouth 2 (two) times daily.   oxyCODONE   (ROXICODONE ) 5 MG immediate release tablet Take 1 tablet (5 mg total) by mouth every 8 (eight) hours as needed for up to 12 doses.   polyethylene glycol (MIRALAX ) 17 g packet Take 17 g by mouth daily.   TRUE METRIX BLOOD GLUCOSE TEST test strip SMARTSIG:Via Meter   TRUEplus Lancets 33G MISC    No facility-administered medications prior to visit.   Review of Systems     Objective    BP 132/70 (BP Location: Left Arm, Patient Position: Sitting, Cuff Size: Large)   Pulse (!) 54   Resp 16   Ht 5' 10 (1.778 m)   Wt 247 lb 6.4 oz (112.2 kg)   SpO2 100%   BMI 35.50 kg/m   Physical Exam   General: Appearance:    Mildly obese male in no acute distress  Eyes:    PERRL, conjunctiva/corneas clear, EOM's intact       Lungs:     Clear to auscultation bilaterally, respirations unlabored  Heart:    Bradycardic. Normal rhythm. No murmurs, rubs, or gallops.    MS:   All extremities are intact.    Neurologic:   Awake, alert, oriented x 3. No apparent focal neurological defect.        Results for orders placed or performed in visit on 05/18/24  POCT  glycosylated hemoglobin (Hb A1C)  Result Value Ref Range   Hemoglobin A1C 6.2 (A) 4.0 - 5.6 %   Est. average glucose Bld gHb Est-mCnc 131      Assessment & Plan        Prediabetes A1c improved to 6.2%, indicating better glycemic control but still in prediabetes range. - Encouraged continuation of dietary modifications. - Scheduled follow-up in 5-6 months to reassess A1c and health.  Right-sided low back pain after fall Persistent soreness post-fall, concern for fracture. - Ordered x-ray of the right side to assess for fracture or injury. - Advised proceeding to imaging center for x-ray.    Return in about 5 months (around 10/16/2024) for Yearly Physical.     Nancyann Perry, MD  [facility] [phone] (phone) [phone] (fax)  [facility]

## 2024-05-24 ENCOUNTER — Ambulatory Visit: Payer: Self-pay | Admitting: Family Medicine

## 2024-07-22 ENCOUNTER — Telehealth: Payer: Self-pay

## 2024-07-22 NOTE — Telephone Encounter (Signed)
 Advised there is nothing in chart to indicate anyone was trying to call him.  If so they will likely call again

## 2024-07-22 NOTE — Telephone Encounter (Signed)
 Copied from CRM [id number]. Topic: General - Inquiry >> Jul 22, 2024  9:42 AM Laymon HERO wrote: Reason for CRM: Patient missed call from office- no notes on any calls today

## 2024-10-16 ENCOUNTER — Encounter: Admitting: Family Medicine
